# Patient Record
Sex: Male | Born: 1945
Health system: Southern US, Community
[De-identification: ages and names within clinical notes are randomized; demographics above are authoritative.]

## PROBLEM LIST (undated history)

## (undated) DIAGNOSIS — M4802 Spinal stenosis, cervical region: Secondary | ICD-10-CM

## (undated) DIAGNOSIS — H269 Unspecified cataract: Secondary | ICD-10-CM

## (undated) DIAGNOSIS — K635 Polyp of colon: Secondary | ICD-10-CM

## (undated) DIAGNOSIS — I10 Essential (primary) hypertension: Secondary | ICD-10-CM

## (undated) DIAGNOSIS — M1990 Unspecified osteoarthritis, unspecified site: Secondary | ICD-10-CM

## (undated) DIAGNOSIS — K5730 Diverticulosis of large intestine without perforation or abscess without bleeding: Secondary | ICD-10-CM

## (undated) DIAGNOSIS — R718 Other abnormality of red blood cells: Secondary | ICD-10-CM

## (undated) DIAGNOSIS — K219 Gastro-esophageal reflux disease without esophagitis: Secondary | ICD-10-CM

## (undated) DIAGNOSIS — R972 Elevated prostate specific antigen [PSA]: Secondary | ICD-10-CM

## (undated) DIAGNOSIS — N400 Enlarged prostate without lower urinary tract symptoms: Secondary | ICD-10-CM

## (undated) DIAGNOSIS — N529 Male erectile dysfunction, unspecified: Secondary | ICD-10-CM

## (undated) DIAGNOSIS — C801 Malignant (primary) neoplasm, unspecified: Secondary | ICD-10-CM

## (undated) HISTORY — PX: HEMORRHOID SURGERY: SHX153

## (undated) HISTORY — DX: Other abnormality of red blood cells: R71.8

## (undated) HISTORY — DX: Male erectile dysfunction, unspecified: N52.9

## (undated) HISTORY — PX: EYE SURGERY: SHX253

## (undated) HISTORY — DX: Benign prostatic hyperplasia without lower urinary tract symptoms: N40.0

## (undated) HISTORY — DX: Spinal stenosis, cervical region: M48.02

## (undated) HISTORY — DX: Unspecified cataract: H26.9

## (undated) HISTORY — PX: WISDOM TOOTH EXTRACTION: SHX21

## (undated) HISTORY — DX: Polyp of colon: K63.5

## (undated) HISTORY — PX: OTHER SURGICAL HISTORY: SHX169

## (undated) HISTORY — DX: Essential (primary) hypertension: I10

## (undated) HISTORY — PX: TONSILLECTOMY: SUR1361

## (undated) HISTORY — DX: Unspecified osteoarthritis, unspecified site: M19.90

## (undated) HISTORY — PX: VASECTOMY: SHX75

## (undated) HISTORY — DX: Diverticulosis of large intestine without perforation or abscess without bleeding: K57.30

## (undated) HISTORY — DX: Elevated prostate specific antigen (PSA): R97.20

---

## 1998-01-19 ENCOUNTER — Ambulatory Visit (HOSPITAL_BASED_OUTPATIENT_CLINIC_OR_DEPARTMENT_OTHER): Admission: RE | Admit: 1998-01-19 | Discharge: 1998-01-19 | Payer: Self-pay | Admitting: Plastic Surgery

## 2000-07-25 ENCOUNTER — Other Ambulatory Visit: Admission: RE | Admit: 2000-07-25 | Discharge: 2000-07-25 | Payer: Self-pay | Admitting: Urology

## 2000-07-25 ENCOUNTER — Encounter (INDEPENDENT_AMBULATORY_CARE_PROVIDER_SITE_OTHER): Payer: Self-pay

## 2004-04-28 ENCOUNTER — Ambulatory Visit: Payer: Self-pay | Admitting: Internal Medicine

## 2004-05-09 ENCOUNTER — Ambulatory Visit: Payer: Self-pay | Admitting: Internal Medicine

## 2005-05-04 ENCOUNTER — Ambulatory Visit: Payer: Self-pay | Admitting: Internal Medicine

## 2005-05-15 ENCOUNTER — Ambulatory Visit: Payer: Self-pay | Admitting: Internal Medicine

## 2005-06-14 ENCOUNTER — Ambulatory Visit: Payer: Self-pay | Admitting: Internal Medicine

## 2006-04-12 ENCOUNTER — Ambulatory Visit: Payer: Self-pay | Admitting: Internal Medicine

## 2006-04-12 LAB — CONVERTED CEMR LAB
Basophils Relative: 0.5 % (ref 0.0–1.0)
CO2: 31 meq/L (ref 19–32)
Creatinine, Ser: 1.1 mg/dL (ref 0.4–1.5)
GFR calc Af Amer: 87 mL/min
HDL: 37.4 mg/dL — ABNORMAL LOW (ref >39.0)
Ketones, ur: NEGATIVE mg/dL
LDL Cholesterol: 124 mg/dL — ABNORMAL HIGH (ref 0–99)
Leukocytes, UA: NEGATIVE
MCHC: 31.2 g/dL (ref 30.0–36.0)
MCV: 60.6 fL — ABNORMAL LOW (ref 78.0–100.0)
Monocytes Absolute: 0.4 10*3/uL (ref 0.2–0.7)
Monocytes Relative: 7.8 % (ref 3.0–11.0)
PSA: 5.17 ng/mL — ABNORMAL HIGH (ref 0.10–4.00)
Potassium: 4.3 meq/L (ref 3.5–5.1)
RBC: 6.83 M/uL — ABNORMAL HIGH (ref 4.22–5.81)
RDW: 14.7 % — ABNORMAL HIGH (ref 11.5–14.6)
TSH: 1.1 microintl units/mL (ref 0.35–5.50)
Total CHOL/HDL Ratio: 4.9
Triglycerides: 113 mg/dL (ref 0–149)
Urine Glucose: NEGATIVE mg/dL
VLDL: 23 mg/dL (ref 0–40)
WBC: 4.9 10*3/uL (ref 4.5–10.5)

## 2006-04-18 ENCOUNTER — Ambulatory Visit: Payer: Self-pay | Admitting: Internal Medicine

## 2007-02-07 HISTORY — PX: TOENAIL EXCISION: SUR558

## 2007-02-11 ENCOUNTER — Encounter: Payer: Self-pay | Admitting: Internal Medicine

## 2007-04-16 ENCOUNTER — Ambulatory Visit: Payer: Self-pay | Admitting: Internal Medicine

## 2007-04-16 LAB — CONVERTED CEMR LAB
ALT: 26 units/L (ref 0–53)
Albumin: 4 g/dL (ref 3.5–5.2)
Alkaline Phosphatase: 55 units/L (ref 39–117)
BUN: 13 mg/dL (ref 6–23)
Basophils Absolute: 0.1 10*3/uL (ref 0.0–0.1)
CO2: 32 meq/L (ref 19–32)
Calcium: 9.4 mg/dL (ref 8.4–10.5)
Chloride: 105 meq/L (ref 96–112)
Cholesterol: 194 mg/dL (ref 0–200)
HDL: 38.1 mg/dL — ABNORMAL LOW (ref >39.0)
Hemoglobin, Urine: NEGATIVE
Hemoglobin: 13 g/dL (ref 13.0–17.0)
Ketones, ur: NEGATIVE mg/dL
LDL Cholesterol: 134 mg/dL — ABNORMAL HIGH (ref 0–99)
Lymphocytes Relative: 23.3 % (ref 12.0–46.0)
Monocytes Absolute: 0.6 10*3/uL (ref 0.2–0.7)
Neutro Abs: 3.2 10*3/uL (ref 1.4–7.7)
PSA: 5.48 ng/mL — ABNORMAL HIGH (ref 0.10–4.00)
Potassium: 4.4 meq/L (ref 3.5–5.1)
Total Bilirubin: 1.2 mg/dL (ref 0.3–1.2)
Total CHOL/HDL Ratio: 5.1
Total Protein: 6.8 g/dL (ref 6.0–8.3)
Triglycerides: 111 mg/dL (ref 0–149)
VLDL: 22 mg/dL (ref 0–40)
pH: 6 (ref 5.0–8.0)

## 2007-04-19 ENCOUNTER — Ambulatory Visit: Payer: Self-pay | Admitting: Internal Medicine

## 2007-04-19 DIAGNOSIS — Z860100 Personal history of colon polyps, unspecified: Secondary | ICD-10-CM | POA: Insufficient documentation

## 2007-04-19 DIAGNOSIS — N400 Enlarged prostate without lower urinary tract symptoms: Secondary | ICD-10-CM | POA: Insufficient documentation

## 2007-04-19 DIAGNOSIS — I10 Essential (primary) hypertension: Secondary | ICD-10-CM | POA: Insufficient documentation

## 2007-04-19 DIAGNOSIS — K5730 Diverticulosis of large intestine without perforation or abscess without bleeding: Secondary | ICD-10-CM | POA: Insufficient documentation

## 2007-04-20 DIAGNOSIS — J309 Allergic rhinitis, unspecified: Secondary | ICD-10-CM | POA: Insufficient documentation

## 2007-07-18 ENCOUNTER — Ambulatory Visit: Payer: Self-pay | Admitting: Gastroenterology

## 2007-07-31 ENCOUNTER — Ambulatory Visit: Payer: Self-pay | Admitting: Gastroenterology

## 2007-07-31 LAB — HM COLONOSCOPY

## 2007-09-06 ENCOUNTER — Ambulatory Visit: Payer: Self-pay | Admitting: Internal Medicine

## 2007-12-02 ENCOUNTER — Telehealth: Payer: Self-pay | Admitting: Internal Medicine

## 2007-12-02 DIAGNOSIS — R209 Unspecified disturbances of skin sensation: Secondary | ICD-10-CM

## 2008-01-13 ENCOUNTER — Telehealth (INDEPENDENT_AMBULATORY_CARE_PROVIDER_SITE_OTHER): Payer: Self-pay | Admitting: *Deleted

## 2008-01-14 ENCOUNTER — Ambulatory Visit: Payer: Self-pay | Admitting: Internal Medicine

## 2008-01-14 DIAGNOSIS — R059 Cough, unspecified: Secondary | ICD-10-CM | POA: Insufficient documentation

## 2008-01-14 DIAGNOSIS — R05 Cough: Secondary | ICD-10-CM

## 2008-01-14 DIAGNOSIS — B351 Tinea unguium: Secondary | ICD-10-CM

## 2008-01-14 DIAGNOSIS — J209 Acute bronchitis, unspecified: Secondary | ICD-10-CM | POA: Insufficient documentation

## 2008-01-16 ENCOUNTER — Encounter: Payer: Self-pay | Admitting: Internal Medicine

## 2008-01-20 ENCOUNTER — Encounter: Payer: Self-pay | Admitting: Internal Medicine

## 2008-01-27 ENCOUNTER — Encounter: Payer: Self-pay | Admitting: Internal Medicine

## 2008-04-02 ENCOUNTER — Encounter: Payer: Self-pay | Admitting: Internal Medicine

## 2008-04-14 ENCOUNTER — Ambulatory Visit: Payer: Self-pay | Admitting: Internal Medicine

## 2008-04-15 ENCOUNTER — Encounter: Admission: RE | Admit: 2008-04-15 | Discharge: 2008-04-28 | Payer: Self-pay | Admitting: Neurology

## 2008-04-16 LAB — CONVERTED CEMR LAB
ALT: 31 units/L (ref 0–53)
AST: 27 units/L (ref 0–37)
Albumin: 3.8 g/dL (ref 3.5–5.2)
Alkaline Phosphatase: 52 units/L (ref 39–117)
Basophils Absolute: 0.1 10*3/uL (ref 0.0–0.1)
Basophils Relative: 1.4 % (ref 0.0–3.0)
Bilirubin, Direct: 0.2 mg/dL (ref 0.0–0.3)
Eosinophils Absolute: 0.2 10*3/uL (ref 0.0–0.7)
Glucose, Bld: 102 mg/dL — ABNORMAL HIGH (ref 70–99)
HCT: 40.8 % (ref 39.0–52.0)
Hemoglobin, Urine: NEGATIVE
Leukocytes, UA: NEGATIVE
Monocytes Absolute: 0.6 10*3/uL (ref 0.1–1.0)
Neutro Abs: 3 10*3/uL (ref 1.4–7.7)
Neutrophils Relative %: 62 % (ref 43.0–77.0)
PSA: 5.88 ng/mL — ABNORMAL HIGH (ref 0.10–4.00)
Potassium: 4.3 meq/L (ref 3.5–5.1)
RBC: 6.69 M/uL — ABNORMAL HIGH (ref 4.22–5.81)
RDW: 14.9 % — ABNORMAL HIGH (ref 11.5–14.6)
Sodium: 141 meq/L (ref 135–145)
Total CHOL/HDL Ratio: 4.7
Total Protein, Urine: NEGATIVE mg/dL
VLDL: 22 mg/dL (ref 0–40)
WBC: 4.8 10*3/uL (ref 4.5–10.5)

## 2008-04-21 ENCOUNTER — Ambulatory Visit: Payer: Self-pay | Admitting: Internal Medicine

## 2008-04-21 DIAGNOSIS — J019 Acute sinusitis, unspecified: Secondary | ICD-10-CM | POA: Insufficient documentation

## 2008-12-15 ENCOUNTER — Encounter: Payer: Self-pay | Admitting: Internal Medicine

## 2009-01-06 ENCOUNTER — Telehealth: Payer: Self-pay | Admitting: Internal Medicine

## 2009-04-19 ENCOUNTER — Ambulatory Visit: Payer: Self-pay | Admitting: Internal Medicine

## 2009-04-21 LAB — CONVERTED CEMR LAB
ALT: 21 units/L (ref 0–53)
AST: 20 units/L (ref 0–37)
Albumin: 4 g/dL (ref 3.5–5.2)
BUN: 12 mg/dL (ref 6–23)
Bilirubin Urine: NEGATIVE
Bilirubin, Direct: 0.2 mg/dL (ref 0.0–0.3)
CO2: 32 meq/L (ref 19–32)
Creatinine, Ser: 1.1 mg/dL (ref 0.4–1.5)
Eosinophils Relative: 3.1 % (ref 0.0–5.0)
Hemoglobin, Urine: NEGATIVE
LDL Cholesterol: 121 mg/dL — ABNORMAL HIGH (ref 0–99)
Leukocytes, UA: NEGATIVE
Monocytes Absolute: 0.5 10*3/uL (ref 0.1–1.0)
Neutro Abs: 3.1 10*3/uL (ref 1.4–7.7)
Neutrophils Relative %: 59.6 % (ref 43.0–77.0)
Platelets: 166 10*3/uL (ref 150.0–400.0)
RBC: 6.69 M/uL — ABNORMAL HIGH (ref 4.22–5.81)
RDW: 15.2 % — ABNORMAL HIGH (ref 11.5–14.6)
Sodium: 144 meq/L (ref 135–145)
TSH: 1.33 microintl units/mL (ref 0.35–5.50)
Total Bilirubin: 1.3 mg/dL — ABNORMAL HIGH (ref 0.3–1.2)
Total CHOL/HDL Ratio: 4
Total Protein, Urine: NEGATIVE mg/dL
Urobilinogen, UA: 0.2 (ref 0.0–1.0)
WBC: 5.1 10*3/uL (ref 4.5–10.5)
pH: 5.5 (ref 5.0–8.0)

## 2009-04-23 ENCOUNTER — Encounter: Payer: Self-pay | Admitting: Internal Medicine

## 2009-04-23 ENCOUNTER — Ambulatory Visit: Payer: Self-pay | Admitting: Internal Medicine

## 2009-04-23 DIAGNOSIS — M79602 Pain in left arm: Secondary | ICD-10-CM | POA: Insufficient documentation

## 2009-04-23 DIAGNOSIS — M79609 Pain in unspecified limb: Secondary | ICD-10-CM

## 2009-12-07 ENCOUNTER — Encounter: Payer: Self-pay | Admitting: Internal Medicine

## 2010-01-06 ENCOUNTER — Telehealth: Payer: Self-pay | Admitting: Internal Medicine

## 2010-03-10 NOTE — Assessment & Plan Note (Signed)
Summary: PHYSICAL--STC   Vital Signs:  Patient profile:   65 year old male Height:      68 inches Weight:      201 pounds BMI:     30.67 Temp:     97.7 degrees F oral Pulse rate:   84 / minute BP sitting:   122 / 82  (left arm)  Vitals Entered By: Tora Perches (April 23, 2009 10:21 AM) CC: cpx Is Patient Diabetic? No   CC:  cpx.  History of Present Illness: The patient presents for a wellness examination  C/o LBP  down R leg x 2 wks  Preventive Screening-Counseling & Management  Alcohol-Tobacco     Smoking Status: never  Current Medications (verified): 1)  Zolpidem Tartrate 10 Mg  Tabs (Zolpidem Tartrate) .... At Bedtime As Needed 2)  Diovan Hct 320-25 Mg Tabs (Valsartan-Hydrochlorothiazide) .... Take 1 Tab Daily 3)  Vitamin D3 1000 Unit  Tabs (Cholecalciferol) .Marland Kitchen.. 1 By Mouth Daily 4)  Fish Oil   Oil (Fish Oil) .Marland Kitchen.. 1 By Mouth Bid 5)  Flonase 50 Mcg/act  Susp (Fluticasone Propionate) .... 2 Spr in Each Nostril Qd 6)  Miralax   Powd (Polyethylene Glycol 3350) .Marland Kitchen.. 17g By Mouth Once Daily As Needed Constipation 7)  Prednisone 10 Mg Tabs (Prednisone) .... As Needed 8)  Cyclobenzaprine Hcl 10 Mg Tabs (Cyclobenzaprine Hcl) .... As Needed  Allergies: 1)  Levaquin  Past History:  Past Medical History: Last updated: 04/21/2008 Diverticulosis, colon Hypertension Benign prostatic hypertrophy/Elev.PSA ED Colonic polyps, hx of Hemorroids Microcytosis Allergic rhinitis Cervical spinal stenosis Dr Marylou Flesher  Past Surgical History: Last updated: 04/21/2008 All toenails removed 2009  Family History: Last updated: 04/19/2007 Family History Hypertension  Social History: Last updated: 04/21/2008 Occupation: Oceanographer; racing cars Married Never Smoked Regular exercise-yes  Review of Systems  The patient denies anorexia, fever, weight loss, weight gain, vision loss, decreased hearing, hoarseness, chest pain, syncope, dyspnea on exertion, peripheral edema, prolonged  cough, headaches, hemoptysis, abdominal pain, melena, hematochezia, severe indigestion/heartburn, hematuria, incontinence, genital sores, muscle weakness, suspicious skin lesions, transient blindness, difficulty walking, depression, unusual weight change, abnormal bleeding, enlarged lymph nodes, angioedema, and testicular masses.         LBP  Physical Exam  General:  NAD Head:  Normocephalic and atraumatic without obvious abnormalities. No apparent alopecia or balding. Eyes:  No corneal or conjunctival inflammation noted. EOMI. Perrla.  Ears:  External ear exam shows no significant lesions or deformities.  Otoscopic examination reveals some wax B, tympanic membranes are intact bilaterally without bulging, retraction, inflammation or discharge. Hearing is grossly normal bilaterally. Nose:  no external deformity and no nasal discharge.   Mouth:  Oral mucosa and oropharynx without lesions or exudates.  Teeth in good repair. Neck:  No deformities, masses, or tenderness noted. Chest Wall:  No deformities, masses, tenderness or gynecomastia noted. Lungs:  Normal respiratory effort, chest expands symmetrically. Lungs are clear to auscultation, no crackles or wheezes. Heart:  Normal rate and regular rhythm. S1 and S2 normal without gallop, murmur, click, rub or other extra sounds. Abdomen:  Bowel sounds positive,abdomen soft and non-tender without masses, organomegaly or hernias noted. Rectal:  normal sphincter tone, no masses, no tenderness, no fissures, no perianal rash, and external hemorrhoid(s).   Genitalia:  Testes bilaterally descended without nodularity, tenderness or masses. No scrotal masses or lesions. No penis lesions or urethral discharge. Prostate:  1+ enlarged.   Msk:  Lumbar-sacral spine is tender to palpation over paraspinal muscles and painfull with  the ROM  Stiff LS spine Strait leg elev (-) B Pulses:  R and L carotid,radial,femoral,dorsalis pedis and posterior tibial pulses are  full and equal bilaterally Extremities:  No clubbing, cyanosis, edema, or deformity noted with normal full range of motion of all joints.   Neurologic:  No cranial nerve deficits noted. Station and gait are normal. Plantar reflexes are down-going bilaterally. DTRs are symmetrical throughout. Sensory, motor and coordinative functions appear intact. Skin:  Intact without suspicious lesions or rashes Cervical Nodes:  No lymphadenopathy noted Inguinal Nodes:  No significant adenopathy Psych:  Cognition and judgment appear intact. Alert and cooperative with normal attention span and concentration. No apparent delusions, illusions, hallucinations   Impression & Recommendations:  Problem # 1:  WELL ADULT EXAM (ICD-V70.0) Assessment New Health and age related issues were discussed. Available screening tests and vaccinations were discussed as well. Healthy life style including good diet and execise was discussed. Form for racing was filled out. The labs were reviewed with the patient.  Orders: EKG w/ Interpretation (93000)  Problem # 2:  LOW BACK PAIN, ACUTE (ICD-724.2) Assessment: New Take 40mg  qd for 3 days, then 20 mg qd for 3 days, then 10mg  qd for 6 days, then stop. Take pc.  See "Patient Instructions". RTC in 1 months if not well. His updated medication list for this problem includes:    Cyclobenzaprine Hcl 10 Mg Tabs (Cyclobenzaprine hcl) .Marland Kitchen... 1 po bid as needed    Aleve 220 Mg Caps (Naproxen sodium) .Marland Kitchen... 1 by mouth once daily pc prn  Orders: T-Lumbar Spine 2 Views (72100TC)  Problem # 3:  LEG PAIN (ICD-729.5) R Assessment: New  Orders: T-Lumbar Spine 2 Views (72100TC)  Complete Medication List: 1)  Zolpidem Tartrate 10 Mg Tabs (Zolpidem tartrate) .... At bedtime as needed 2)  Diovan Hct 320-25 Mg Tabs (Valsartan-hydrochlorothiazide) .... Take 1 tab daily 3)  Vitamin D3 1000 Unit Tabs (Cholecalciferol) .Marland Kitchen.. 1 by mouth daily 4)  Fish Oil Oil (Fish oil) .Marland Kitchen.. 1 by mouth bid 5)   Flonase 50 Mcg/act Susp (Fluticasone propionate) .... 2 spr in each nostril qd 6)  Miralax Powd (Polyethylene glycol 3350) .Marland Kitchen.. 17g by mouth once daily as needed constipation 7)  Prednisone 10 Mg Tabs (Prednisone) .... As needed 8)  Cyclobenzaprine Hcl 10 Mg Tabs (Cyclobenzaprine hcl) .Marland Kitchen.. 1 po bid as needed 9)  Aleve 220 Mg Caps (Naproxen sodium) .Marland Kitchen.. 1 by mouth once daily pc prn 10)  Prednisone 10 Mg Tabs (Prednisone) .... Take 40mg  qd for 3 days, then 20 mg qd for 3 days, then 10mg  qd for 6 days, then stop. take pc.  Other Orders: Pneumococcal Vaccine (04540) Admin 1st Vaccine (98119) Admin 1st Vaccine [facility]) [phone])  Patient Instructions: 1)  Try to eat more raw plant food, fresh and dry fruit, raw almonds, leafy vegetables, whole foods and less red meat, less animal fat. Poultry and fish is better for you than pork and beef. Avoid processed foods (canned soups, hot dogs, sausage, bacon , frozen dinners). Avoid corn syrup, high fructose syrup or aspartam  containing drinks.. Make your own  dressing with olive oil, wine vinegar, lemon juce, garlic etc. for your salads. 2)  Please schedule a follow-up appointment in 1 year. 3)  Call if you are not better in a reasonable amount of time or if worse.  4)  Start taking a yoga class  5)  Use stretching exercises that I have provided (15 min. or longer every day) Prescriptions: CYCLOBENZAPRINE HCL 10  MG TABS (CYCLOBENZAPRINE HCL) 1 po bid as needed  #60 x 2   Entered and Authorized by:   Tresa Garter MD   Signed by:   Tresa Garter MD on 04/23/2009   Method used:   Print then Give to Patient   RxID:   [id number] PREDNISONE 10 MG TABS (PREDNISONE) Take 40mg  qd for 3 days, then 20 mg qd for 3 days, then 10mg  qd for 6 days, then stop. Take pc.  #24 x 1   Entered and Authorized by:   Tresa Garter MD   Signed by:   Tresa Garter MD on 04/23/2009   Method used:   Print then Give to Patient   RxID:    [id number] ALEVE 220 MG CAPS (NAPROXEN SODIUM) 1 by mouth once daily pc prn  #100 x 3   Entered and Authorized by:   Tresa Garter MD   Signed by:   Tresa Garter MD on 04/23/2009   Method used:   Print then Give to Patient   RxID:   [id number] FLONASE 50 MCG/ACT  SUSP (FLUTICASONE PROPIONATE) 2 spr in each nostril qd  #1 x 12   Entered and Authorized by:   Tresa Garter MD   Signed by:   Tresa Garter MD on 04/23/2009   Method used:   Print then Give to Patient   RxID:   [id number] FISH OIL   OIL (FISH OIL) 1 by mouth bid  #200 x 3   Entered and Authorized by:   Tresa Garter MD   Signed by:   Tresa Garter MD on 04/23/2009   Method used:   Print then Give to Patient   RxID:   [id number] VITAMIN D3 1000 UNIT  TABS (CHOLECALCIFEROL) 1 by mouth daily  #100 x 3   Entered and Authorized by:   Tresa Garter MD   Signed by:   Tresa Garter MD on 04/23/2009   Method used:   Print then Give to Patient   RxID:   [id number] DIOVAN HCT 320-25 MG TABS (VALSARTAN-HYDROCHLOROTHIAZIDE) Take 1 tab daily  #30 x 11   Entered and Authorized by:   Tresa Garter MD   Signed by:   Tresa Garter MD on 04/23/2009   Method used:   Print then Give to Patient   RxID:   [id number] ZOLPIDEM TARTRATE 10 MG  TABS (ZOLPIDEM TARTRATE) at bedtime as needed  #30 x 5   Entered and Authorized by:   Tresa Garter MD   Signed by:   Tresa Garter MD on 04/23/2009   Method used:   Print then Give to Patient   RxID:   [id number]    Pneumovax Vaccine    Vaccine Type: Pneumovax    Site: right deltoid    Mfr: Merck    Dose: 0.5 ml    Route: IM    Given by: Tora Perches    Exp. Date: 09/20/2010    Lot #: [id number]    VIS given: 09/04/95 version given April 23, 2009.

## 2010-03-10 NOTE — Progress Notes (Signed)
Summary: Rf Zolpidem  Phone Note Refill Request Message from:  Fax from Pharmacy  Refills Requested: Medication #1:  ZOLPIDEM TARTRATE 10 MG  TABS at bedtime as needed   Dosage confirmed as above?Dosage Confirmed   Supply Requested: 30   Last Refilled: 12/07/2009  Method Requested: Telephone to Pharmacy Next Appointment Scheduled: none Initial call taken by: Lanier Prude, [facility]),  January 06, 2010 11:51 AM  Follow-up for Phone Call        ok 6 ref Follow-up by: Tresa Garter MD,  January 06, 2010 1:01 PM  Additional Follow-up for Phone Call Additional follow up Details #1::        Rx called to pharmacy Additional Follow-up by: Lanier Prude, [facility]),  January 06, 2010 2:25 PM    Prescriptions: ZOLPIDEM TARTRATE 10 MG  TABS (ZOLPIDEM TARTRATE) at bedtime as needed  #30 x 5   Entered by:   Lanier Prude, [facility])   Authorized by:   Tresa Garter MD   Signed by:   Lanier Prude, CMA(AAMA) on 01/06/2010   Method used:   Telephoned to ...       [facility]  [address] [phone]* (retail)       [address]       [place], [place]  [address]       Ph: [phone] or [phone]       Fax: [phone]   RxID:   [id number]

## 2010-03-10 NOTE — Letter (Signed)
Summary: Physical Exam/[facility]  Physical Exam/[facility]   Imported By: Sherian Rein 04/27/2009 13:49:49  _____________________________________________________________________  External Attachment:    Type:   Image     Comment:   External Document

## 2010-03-10 NOTE — Miscellaneous (Signed)
Summary: [facility]   Imported By: Lester [place] 04/30/2009 10:26:08  _____________________________________________________________________  External Attachment:    Type:   Image     Comment:   External Document

## 2010-03-10 NOTE — Letter (Signed)
Summary: [facility]  [facility]   Imported By: Lennie Odor 12/22/2009 15:00:14  _____________________________________________________________________  External Attachment:    Type:   Image     Comment:   External Document

## 2010-05-05 ENCOUNTER — Other Ambulatory Visit: Payer: Self-pay | Admitting: Internal Medicine

## 2010-05-05 ENCOUNTER — Other Ambulatory Visit (INDEPENDENT_AMBULATORY_CARE_PROVIDER_SITE_OTHER): Payer: PRIVATE HEALTH INSURANCE

## 2010-05-05 DIAGNOSIS — Z0389 Encounter for observation for other suspected diseases and conditions ruled out: Secondary | ICD-10-CM

## 2010-05-05 DIAGNOSIS — Z0000 Encounter for general adult medical examination without abnormal findings: Secondary | ICD-10-CM

## 2010-05-05 LAB — BASIC METABOLIC PANEL
GFR: 64.88 mL/min (ref >60.00)
Potassium: 4.6 mEq/L (ref 3.5–5.1)
Sodium: 142 mEq/L (ref 135–145)

## 2010-05-05 LAB — URINALYSIS
Bilirubin Urine: NEGATIVE
Specific Gravity, Urine: 1.02 (ref 1.000–1.030)
Urine Glucose: NEGATIVE
pH: 6 (ref 5.0–8.0)

## 2010-05-05 LAB — CBC WITH DIFFERENTIAL/PLATELET
Basophils Absolute: 0 10*3/uL (ref 0.0–0.1)
Basophils Relative: 0.4 % (ref 0.0–3.0)
Eosinophils Absolute: 0.1 10*3/uL (ref 0.0–0.7)
MCHC: 32 g/dL (ref 30.0–36.0)
MCV: 60.2 fl — ABNORMAL LOW (ref 78.0–100.0)
Monocytes Absolute: 0.4 10*3/uL (ref 0.1–1.0)
Neutrophils Relative %: 67.4 % (ref 43.0–77.0)
Platelets: 175 10*3/uL (ref 150.0–400.0)
RDW: 16.2 % — ABNORMAL HIGH (ref 11.5–14.6)

## 2010-05-05 LAB — LIPID PANEL
Cholesterol: 168 mg/dL (ref 0–200)
HDL: 41.3 mg/dL (ref >39.00)
Triglycerides: 84 mg/dL (ref 0.0–149.0)
VLDL: 16.8 mg/dL (ref 0.0–40.0)

## 2010-05-05 LAB — HEPATIC FUNCTION PANEL
ALT: 25 U/L (ref 0–53)
AST: 28 U/L (ref 0–37)
Albumin: 4.1 g/dL (ref 3.5–5.2)
Alkaline Phosphatase: 48 U/L (ref 39–117)
Bilirubin, Direct: 0.2 mg/dL (ref 0.0–0.3)
Total Protein: 6.5 g/dL (ref 6.0–8.3)

## 2010-05-12 ENCOUNTER — Ambulatory Visit (INDEPENDENT_AMBULATORY_CARE_PROVIDER_SITE_OTHER)
Admission: RE | Admit: 2010-05-12 | Discharge: 2010-05-12 | Disposition: A | Discharge status: Home / Self Care | Payer: PRIVATE HEALTH INSURANCE | Source: Ambulatory Visit | Attending: Internal Medicine | Admitting: Internal Medicine

## 2010-05-12 ENCOUNTER — Ambulatory Visit (INDEPENDENT_AMBULATORY_CARE_PROVIDER_SITE_OTHER): Payer: PRIVATE HEALTH INSURANCE | Admitting: Internal Medicine

## 2010-05-12 ENCOUNTER — Encounter: Payer: Self-pay | Admitting: Internal Medicine

## 2010-05-12 ENCOUNTER — Telehealth: Payer: Self-pay | Admitting: *Deleted

## 2010-05-12 VITALS — BP 110/70 | HR 72 | Temp 98.2°F | Resp 16 | Ht 68.0 in | Wt 186.0 lb

## 2010-05-12 DIAGNOSIS — Z0000 Encounter for general adult medical examination without abnormal findings: Secondary | ICD-10-CM | POA: Insufficient documentation

## 2010-05-12 DIAGNOSIS — R071 Chest pain on breathing: Secondary | ICD-10-CM

## 2010-05-12 DIAGNOSIS — Z23 Encounter for immunization: Secondary | ICD-10-CM

## 2010-05-12 DIAGNOSIS — R0789 Other chest pain: Secondary | ICD-10-CM | POA: Insufficient documentation

## 2010-05-12 MED ORDER — FLUTICASONE PROPIONATE 50 MCG/ACT NA SUSP: 2.0000 | Freq: Every day | NASAL | Status: DC

## 2010-05-12 MED ORDER — PREDNISONE 10 MG PO TABS: 10.0000 mg | ORAL_TABLET | Freq: Two times a day (BID) | ORAL | Status: DC

## 2010-05-12 MED ORDER — NAPROXEN SODIUM 220 MG PO TABS: 220.0000 mg | ORAL_TABLET | Freq: Every day | ORAL | Status: DC | PRN

## 2010-05-12 MED ORDER — VALSARTAN-HYDROCHLOROTHIAZIDE 320-25 MG PO TABS: 1.0000 | ORAL_TABLET | Freq: Every day | ORAL | Status: DC

## 2010-05-12 NOTE — Patient Instructions (Addendum)
Stay well! You can try to take Diovan HCT 1/2 tab. A day

## 2010-05-12 NOTE — Assessment & Plan Note (Addendum)
EKG is nl Doing well Colon due 2019 Tetanus inj - done Form for car racing was filled out

## 2010-05-12 NOTE — Progress Notes (Signed)
Subjective:    Patient ID: Chad Palmer, male    DOB: 02/05/1946, 65 y.o.   MRN: [id number]  HPI The patient is here for a wellness exam. The patient has been doing well overall without major physical or psychological issues going on lately. The patient needs to address his chronic hypertension that has been well controlled with medicines. He had some R CP in the back x few days ?? After yard work  Review of Systems  Constitutional: Negative for appetite change, fatigue and unexpected weight change.  HENT: Negative for nosebleeds, congestion, sore throat, sneezing, trouble swallowing and neck pain.   Eyes: Negative for itching and visual disturbance.  Respiratory: Negative for cough.   Cardiovascular: Negative for chest pain, palpitations and leg swelling.  Gastrointestinal: Negative for nausea, diarrhea, blood in stool and abdominal distention.  Genitourinary: Negative for frequency and hematuria.  Musculoskeletal: Negative for back pain, joint swelling and gait problem.  Skin: Negative for rash.  Neurological: Negative for dizziness, tremors, speech difficulty and weakness.  Psychiatric/Behavioral: Negative for sleep disturbance, dysphoric mood and agitation. The patient is not nervous/anxious.    Wt Readings from Last 3 Encounters:  05/12/10 186 lb (84.369 kg)  04/23/09 201 lb (91.173 kg)  04/21/08 205 lb (92.987 kg)       Objective:   Physical Exam  Constitutional: He is oriented to person, place, and time. He appears well-developed and well-nourished. No distress.  HENT:  Head: Normocephalic and atraumatic.  Right Ear: External ear normal.  Left Ear: External ear normal.  Nose: Nose normal.  Mouth/Throat: Oropharynx is clear and moist. No oropharyngeal exudate.  Eyes: Conjunctivae and EOM are normal. Pupils are equal, round, and reactive to light. Right eye exhibits no discharge. Left eye exhibits no discharge. No scleral icterus.  Neck: Normal range of motion. Neck  supple. No JVD present. No tracheal deviation present. No thyromegaly present.  Cardiovascular: Normal rate, regular rhythm, normal heart sounds and intact distal pulses.  Exam reveals no gallop and no friction rub.   No murmur heard. Pulmonary/Chest: Effort normal and breath sounds normal. No stridor. No respiratory distress. He has no wheezes. He has no rales. He exhibits no tenderness.  Abdominal: Soft. Bowel sounds are normal. He exhibits no distension and no mass. There is no tenderness. There is no rebound and no guarding.  Genitourinary: Rectum normal, prostate normal and penis normal. Guaiac negative stool. No penile tenderness.  Musculoskeletal: Normal range of motion. He exhibits no edema and no tenderness.  Lymphadenopathy:    He has no cervical adenopathy.  Neurological: He is alert and oriented to person, place, and time. He has normal reflexes. No cranial nerve deficit. He exhibits normal muscle tone. Coordination normal.  Skin: Skin is warm and dry. No rash noted. He is not diaphoretic. No erythema. No pallor.  Psychiatric: He has a normal mood and affect. His behavior is normal. Judgment and thought content normal.       Lab Results  Component Value Date   WBC 4.3* 05/05/2010   HGB 12.5* 05/05/2010   HCT 39.0 05/05/2010   PLT 175.0 05/05/2010   CHOL 168 05/05/2010   TRIG 84.0 05/05/2010   HDL 41.30 05/05/2010   ALT 25 05/05/2010   AST 28 05/05/2010   NA 142 05/05/2010   K 4.6 05/05/2010   CL 106 05/05/2010   CREATININE 1.2 05/05/2010   BUN 16 05/05/2010   CO2 30 05/05/2010   TSH 0.77 05/05/2010   PSA 6.35* 05/05/2010  Assessment & Plan:

## 2010-05-12 NOTE — Telephone Encounter (Signed)
Please see 05-12-10 CXR.... Radiologist recommends Chest CT with contrast if no contrindications due to nodule at Rt lung base.  Pt was seen today for CPE

## 2010-05-12 NOTE — Telephone Encounter (Signed)
Will schedule, Thank you!

## 2010-05-12 NOTE — Assessment & Plan Note (Signed)
Likely MSK - get a CXR

## 2010-05-12 NOTE — Telephone Encounter (Signed)
This can wait until reviewed by Dr. AVP next week.

## 2010-05-16 ENCOUNTER — Other Ambulatory Visit: Payer: Self-pay | Admitting: *Deleted

## 2010-05-16 ENCOUNTER — Telehealth: Payer: Self-pay | Admitting: *Deleted

## 2010-05-16 DIAGNOSIS — R9389 Abnormal findings on diagnostic imaging of other specified body structures: Secondary | ICD-10-CM

## 2010-05-16 MED ORDER — TETANUS-DIPHTHERIA TOXOIDS TD 5-2 LFU IM INJ
0.5000 mL | INJECTION | Freq: Once | INTRAMUSCULAR | Status: AC
  Administered 2010-05-16: 0.5 mL via INTRAMUSCULAR

## 2010-05-16 MED ORDER — TETANUS-DIPHTHERIA TOXOIDS TD 5-2 LFU IM INJ: 0.5000 mL | INJECTION | Freq: Once | INTRAMUSCULAR | Status: DC

## 2010-05-16 NOTE — Telephone Encounter (Signed)
Pt informed

## 2010-05-16 NOTE — Telephone Encounter (Signed)
rec fax from pharm stating naproxen 220mg  is not available....they have 250mg .  Ok to fill this instead?

## 2010-05-16 NOTE — Telephone Encounter (Signed)
Left mess for patient to call back to inform of below

## 2010-05-16 NOTE — Progress Notes (Signed)
Addended by: Burnard Leigh on: 05/16/2010 12:06 PM   Modules accepted: Orders

## 2010-05-16 NOTE — Progress Notes (Signed)
error 

## 2010-05-16 NOTE — Progress Notes (Signed)
Addended by: Burnard Leigh on: 05/16/2010 11:52 AM   Modules accepted: Orders

## 2010-05-17 ENCOUNTER — Other Ambulatory Visit: Payer: Self-pay | Admitting: Neurosurgery

## 2010-05-17 DIAGNOSIS — R9389 Abnormal findings on diagnostic imaging of other specified body structures: Secondary | ICD-10-CM | POA: Insufficient documentation

## 2010-05-17 NOTE — Telephone Encounter (Signed)
1. OK 250 mg Naproxen instead 2. Pls sch chest CT - ref is done

## 2010-05-18 MED ORDER — NAPROXEN SODIUM 220 MG PO TABS: 220.0000 mg | ORAL_TABLET | Freq: Every day | ORAL | Status: DC | PRN

## 2010-05-18 MED ORDER — NAPROXEN 250 MG PO TABS: 250.0000 mg | ORAL_TABLET | Freq: Every day | ORAL | Status: DC

## 2010-05-18 NOTE — Telephone Encounter (Signed)
New rx sent

## 2010-05-24 ENCOUNTER — Telehealth: Payer: Self-pay | Admitting: Internal Medicine

## 2010-05-24 ENCOUNTER — Ambulatory Visit (INDEPENDENT_AMBULATORY_CARE_PROVIDER_SITE_OTHER)
Admission: RE | Admit: 2010-05-24 | Discharge: 2010-05-24 | Disposition: A | Discharge status: Home / Self Care | Payer: PRIVATE HEALTH INSURANCE | Source: Ambulatory Visit | Attending: Internal Medicine | Admitting: Internal Medicine

## 2010-05-24 DIAGNOSIS — R918 Other nonspecific abnormal finding of lung field: Secondary | ICD-10-CM

## 2010-05-24 MED ORDER — IOHEXOL 300 MG/ML  SOLN
80.0000 mL | Freq: Once | INTRAMUSCULAR | Status: AC | PRN
  Administered 2010-05-24: 80 mL via INTRAVENOUS

## 2010-05-24 NOTE — Telephone Encounter (Signed)
Message copied by Sonda Primes on Tue May 24, 2010 10:15 PM ------      Message from: Lanier Prude      Created: Tue May 24, 2010  5:40 PM                   ----- Message -----         From: Rad Results In Interface         Sent: 05/24/2010   4:23 PM           To: Lanier Prude, CMA

## 2010-05-24 NOTE — Telephone Encounter (Signed)
Chad Palmer , please, inform the patient:  The chest CT is normal, good newsThank you !

## 2010-05-25 ENCOUNTER — Telehealth: Payer: Self-pay | Admitting: Internal Medicine

## 2010-05-25 NOTE — Telephone Encounter (Signed)
Pt requests CT results done at [facility], very anxious.

## 2010-05-26 NOTE — Telephone Encounter (Signed)
Pt informed
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
## 2010-07-05 ENCOUNTER — Telehealth: Payer: Self-pay | Admitting: *Deleted

## 2010-07-05 NOTE — Telephone Encounter (Signed)
rec rf req for zolpidem 10mg  1 po qhs. # 30. Last filled 06-07-10. Ok to RF?

## 2010-07-05 NOTE — Telephone Encounter (Signed)
OK to fill this prescription with additional refills x3 Thank you!  

## 2010-07-06 MED ORDER — ZOLPIDEM TARTRATE 10 MG PO TABS: 10.0000 mg | ORAL_TABLET | Freq: Every evening | ORAL | Status: DC | PRN

## 2010-11-07 ENCOUNTER — Telehealth: Payer: Self-pay | Admitting: *Deleted

## 2010-11-07 NOTE — Telephone Encounter (Signed)
Rf req for Zolpidem 10mg  1 po qhs prn. # 30. Last filled 10-09-10 Ok to Rf?

## 2010-11-08 MED ORDER — ZOLPIDEM TARTRATE 10 MG PO TABS: 10.0000 mg | ORAL_TABLET | Freq: Every evening | ORAL | Status: DC | PRN

## 2010-11-08 NOTE — Telephone Encounter (Signed)
OK to fill this prescription with additional refills x5 Thank you!  

## 2011-05-01 ENCOUNTER — Telehealth: Payer: Self-pay | Admitting: *Deleted

## 2011-05-01 DIAGNOSIS — Z0389 Encounter for observation for other suspected diseases and conditions ruled out: Secondary | ICD-10-CM

## 2011-05-01 DIAGNOSIS — Z0000 Encounter for general adult medical examination without abnormal findings: Secondary | ICD-10-CM

## 2011-05-01 NOTE — Telephone Encounter (Signed)
Message copied by Merrilyn Puma on Mon May 01, 2011  8:35 AM ------      Message from: COUSIN, SHARON T      Created: Mon Apr 17, 2011  3:56 PM      Regarding: PHY DATE:  06/21/11       Per pt will have lab done early before  phy appt on the same day.

## 2011-05-01 NOTE — Telephone Encounter (Signed)
06/2011 CPE labs entered.

## 2011-05-03 ENCOUNTER — Telehealth: Payer: Self-pay | Admitting: *Deleted

## 2011-05-03 NOTE — Telephone Encounter (Signed)
OK to fill this prescription with additional refills x2 Thank you!  

## 2011-05-03 NOTE — Telephone Encounter (Signed)
Rf req for zolpidem 10 mg 1 po qhs prn. Ok to Rf?

## 2011-05-04 MED ORDER — ZOLPIDEM TARTRATE 10 MG PO TABS: 10.0000 mg | ORAL_TABLET | Freq: Every evening | ORAL | Status: DC | PRN

## 2011-06-21 ENCOUNTER — Other Ambulatory Visit (INDEPENDENT_AMBULATORY_CARE_PROVIDER_SITE_OTHER): Payer: Medicare Other

## 2011-06-21 ENCOUNTER — Encounter: Payer: Self-pay | Admitting: Internal Medicine

## 2011-06-21 ENCOUNTER — Ambulatory Visit (INDEPENDENT_AMBULATORY_CARE_PROVIDER_SITE_OTHER): Payer: Medicare Other | Admitting: Internal Medicine

## 2011-06-21 VITALS — BP 130/85 | HR 80 | Temp 98.3°F | Resp 16 | Ht 68.0 in | Wt 191.0 lb

## 2011-06-21 DIAGNOSIS — J309 Allergic rhinitis, unspecified: Secondary | ICD-10-CM

## 2011-06-21 DIAGNOSIS — Z0389 Encounter for observation for other suspected diseases and conditions ruled out: Secondary | ICD-10-CM

## 2011-06-21 DIAGNOSIS — Z0000 Encounter for general adult medical examination without abnormal findings: Secondary | ICD-10-CM

## 2011-06-21 DIAGNOSIS — I10 Essential (primary) hypertension: Secondary | ICD-10-CM | POA: Diagnosis not present

## 2011-06-21 DIAGNOSIS — Z125 Encounter for screening for malignant neoplasm of prostate: Secondary | ICD-10-CM

## 2011-06-21 DIAGNOSIS — J019 Acute sinusitis, unspecified: Secondary | ICD-10-CM

## 2011-06-21 DIAGNOSIS — Z136 Encounter for screening for cardiovascular disorders: Secondary | ICD-10-CM | POA: Diagnosis not present

## 2011-06-21 LAB — BASIC METABOLIC PANEL
CO2: 29 mEq/L (ref 19–32)
Chloride: 103 mEq/L (ref 96–112)
Sodium: 141 mEq/L (ref 135–145)

## 2011-06-21 LAB — CBC WITH DIFFERENTIAL/PLATELET
Basophils Relative: 0.9 % (ref 0.0–3.0)
Eosinophils Absolute: 0.2 10*3/uL (ref 0.0–0.7)
HCT: 42.1 % (ref 39.0–52.0)
Hemoglobin: 13.1 g/dL (ref 13.0–17.0)
Lymphs Abs: 1.2 10*3/uL (ref 0.7–4.0)
MCHC: 31.2 g/dL (ref 30.0–36.0)
MCV: 60.9 fl — ABNORMAL LOW (ref 78.0–100.0)
Monocytes Absolute: 0.5 10*3/uL (ref 0.1–1.0)
Neutro Abs: 3.7 10*3/uL (ref 1.4–7.7)
RBC: 6.91 Mil/uL — ABNORMAL HIGH (ref 4.22–5.81)

## 2011-06-21 LAB — HEPATIC FUNCTION PANEL
ALT: 18 U/L (ref 0–53)
Alkaline Phosphatase: 55 U/L (ref 39–117)
Bilirubin, Direct: 0.1 mg/dL (ref 0.0–0.3)
Total Bilirubin: 1 mg/dL (ref 0.3–1.2)
Total Protein: 6.6 g/dL (ref 6.0–8.3)

## 2011-06-21 LAB — LIPID PANEL: Total CHOL/HDL Ratio: 4

## 2011-06-21 LAB — URINALYSIS, ROUTINE W REFLEX MICROSCOPIC
Leukocytes, UA: NEGATIVE
Nitrite: NEGATIVE
Specific Gravity, Urine: 1.015 (ref 1.000–1.030)
Total Protein, Urine: NEGATIVE
pH: 6.5 (ref 5.0–8.0)

## 2011-06-21 LAB — PSA: PSA: 9.01 ng/mL — ABNORMAL HIGH (ref 0.10–4.00)

## 2011-06-21 MED ORDER — CEFUROXIME AXETIL 500 MG PO TABS: ORAL_TABLET | ORAL | Status: AC

## 2011-06-21 MED ORDER — FLUTICASONE PROPIONATE 50 MCG/ACT NA SUSP: 2.0000 | Freq: Every day | NASAL | Status: DC

## 2011-06-21 MED ORDER — ZOLPIDEM TARTRATE 10 MG PO TABS: 10.0000 mg | ORAL_TABLET | Freq: Every evening | ORAL | Status: DC | PRN

## 2011-06-21 MED ORDER — VALSARTAN-HYDROCHLOROTHIAZIDE 320-25 MG PO TABS: 1.0000 | ORAL_TABLET | Freq: Every day | ORAL | Status: DC

## 2011-06-21 NOTE — Progress Notes (Signed)
Patient ID: Chad Palmer, male   DOB: [birth year], 66 y.o.   MRN: [id number]  Subjective:    Patient ID: Chad Palmer, male    DOB: 11-30-1945, 66 y.o.   MRN: [id number]  HPI The patient is here for a wellness exam. The patient has been doing well overall without major physical or psychological issues going on lately. The patient needs to address his chronic hypertension, insomnia, allergies that has been well controlled with medicines  BP Readings from Last 3 Encounters:  06/21/11 130/90  05/12/10 110/70  04/23/09 122/82   Wt Readings from Last 3 Encounters:  06/21/11 191 lb (86.637 kg)  05/12/10 186 lb (84.369 kg)  04/23/09 201 lb (91.173 kg)     Review of Systems  Constitutional: Negative for appetite change, fatigue and unexpected weight change.  HENT: Negative for nosebleeds, congestion, sore throat, sneezing, trouble swallowing and neck pain.   Eyes: Negative for itching and visual disturbance.  Respiratory: Negative for cough.   Cardiovascular: Negative for chest pain, palpitations and leg swelling.  Gastrointestinal: Negative for nausea, diarrhea, blood in stool and abdominal distention.  Genitourinary: Negative for frequency and hematuria.  Musculoskeletal: Negative for back pain, joint swelling and gait problem.  Skin: Negative for rash.  Neurological: Negative for dizziness, tremors, speech difficulty and weakness.  Psychiatric/Behavioral: Negative for sleep disturbance, dysphoric mood and agitation. The patient is not nervous/anxious.    Wt Readings from Last 3 Encounters:  06/21/11 191 lb (86.637 kg)  05/12/10 186 lb (84.369 kg)  04/23/09 201 lb (91.173 kg)       Objective:   Physical Exam  Constitutional: He is oriented to person, place, and time. He appears well-developed and well-nourished. No distress.  HENT:  Head: Normocephalic and atraumatic.  Right Ear: External ear normal.  Left Ear: External ear normal.  Nose: Nose normal.  Mouth/Throat:  Oropharynx is clear and moist. No oropharyngeal exudate.  Eyes: Conjunctivae and EOM are normal. Pupils are equal, round, and reactive to light. Right eye exhibits no discharge. Left eye exhibits no discharge. No scleral icterus.  Neck: Normal range of motion. Neck supple. No JVD present. No tracheal deviation present. No thyromegaly present.  Cardiovascular: Normal rate, regular rhythm, normal heart sounds and intact distal pulses.  Exam reveals no gallop and no friction rub.   No murmur heard. Pulmonary/Chest: Effort normal and breath sounds normal. No stridor. No respiratory distress. He has no wheezes. He has no rales. He exhibits no tenderness.  Abdominal: Soft. Bowel sounds are normal. He exhibits no distension and no mass. There is no tenderness. There is no rebound and no guarding.  Genitourinary: Rectum normal, prostate normal and penis normal. Guaiac negative stool. No penile tenderness.  Musculoskeletal: Normal range of motion. He exhibits no edema and no tenderness.  Lymphadenopathy:    He has no cervical adenopathy.  Neurological: He is alert and oriented to person, place, and time. He has normal reflexes. No cranial nerve deficit. He exhibits normal muscle tone. Coordination normal.  Skin: Skin is warm and dry. No rash noted. He is not diaphoretic. No erythema. No pallor.  Psychiatric: He has a normal mood and affect. His behavior is normal. Judgment and thought content normal.       Lab Results  Component Value Date   WBC 5.7 06/21/2011   HGB 13.1 06/21/2011   HCT 42.1 06/21/2011   PLT 135.0* 06/21/2011   CHOL 177 06/21/2011   TRIG 93.0 06/21/2011   HDL 42.60 06/21/2011  ALT 18 06/21/2011   AST 20 06/21/2011   NA 141 06/21/2011   K 4.9 06/21/2011   CL 103 06/21/2011   CREATININE 1.1 06/21/2011   BUN 17 06/21/2011   CO2 29 06/21/2011   TSH 1.42 06/21/2011   PSA 9.01* 06/21/2011      Assessment & Plan:

## 2011-06-21 NOTE — Assessment & Plan Note (Signed)

## 2011-06-21 NOTE — Assessment & Plan Note (Signed)
Continue with current prescription therapy as reflected on the Med list.  

## 2011-06-21 NOTE — Assessment & Plan Note (Signed)
Start Abx 

## 2011-08-04 ENCOUNTER — Telehealth: Payer: Self-pay | Admitting: *Deleted

## 2011-08-04 NOTE — Telephone Encounter (Signed)
Rf req for Zolpidem 10 mg 1 po qhs. Ok to RF?

## 2011-08-06 NOTE — Telephone Encounter (Signed)
OK to fill this prescription with additional refills x5 Thank you!  

## 2011-08-07 DIAGNOSIS — H2510 Age-related nuclear cataract, unspecified eye: Secondary | ICD-10-CM | POA: Diagnosis not present

## 2011-08-07 DIAGNOSIS — H26019 Infantile and juvenile cortical, lamellar, or zonular cataract, unspecified eye: Secondary | ICD-10-CM | POA: Diagnosis not present

## 2011-08-07 MED ORDER — ZOLPIDEM TARTRATE 10 MG PO TABS: 10.0000 mg | ORAL_TABLET | Freq: Every evening | ORAL | Status: DC | PRN

## 2011-08-07 NOTE — Telephone Encounter (Signed)
Done

## 2011-09-27 DIAGNOSIS — N529 Male erectile dysfunction, unspecified: Secondary | ICD-10-CM | POA: Diagnosis not present

## 2011-09-27 DIAGNOSIS — N400 Enlarged prostate without lower urinary tract symptoms: Secondary | ICD-10-CM | POA: Diagnosis not present

## 2011-09-27 DIAGNOSIS — R972 Elevated prostate specific antigen [PSA]: Secondary | ICD-10-CM | POA: Diagnosis not present

## 2011-11-08 DIAGNOSIS — R972 Elevated prostate specific antigen [PSA]: Secondary | ICD-10-CM | POA: Diagnosis not present

## 2011-12-06 DIAGNOSIS — H2510 Age-related nuclear cataract, unspecified eye: Secondary | ICD-10-CM | POA: Diagnosis not present

## 2011-12-12 DIAGNOSIS — H35379 Puckering of macula, unspecified eye: Secondary | ICD-10-CM | POA: Diagnosis not present

## 2011-12-12 DIAGNOSIS — H43819 Vitreous degeneration, unspecified eye: Secondary | ICD-10-CM | POA: Diagnosis not present

## 2011-12-12 DIAGNOSIS — H2510 Age-related nuclear cataract, unspecified eye: Secondary | ICD-10-CM | POA: Diagnosis not present

## 2011-12-13 DIAGNOSIS — H2510 Age-related nuclear cataract, unspecified eye: Secondary | ICD-10-CM | POA: Diagnosis not present

## 2011-12-27 DIAGNOSIS — H2510 Age-related nuclear cataract, unspecified eye: Secondary | ICD-10-CM | POA: Diagnosis not present

## 2012-02-03 ENCOUNTER — Other Ambulatory Visit: Payer: Self-pay | Admitting: Internal Medicine

## 2012-02-05 NOTE — Telephone Encounter (Signed)
Ok to Rf? 

## 2012-02-09 ENCOUNTER — Other Ambulatory Visit: Payer: Self-pay | Admitting: Internal Medicine

## 2012-05-06 DIAGNOSIS — Z961 Presence of intraocular lens: Secondary | ICD-10-CM | POA: Diagnosis not present

## 2012-06-11 DIAGNOSIS — Z961 Presence of intraocular lens: Secondary | ICD-10-CM | POA: Diagnosis not present

## 2012-06-11 DIAGNOSIS — H35369 Drusen (degenerative) of macula, unspecified eye: Secondary | ICD-10-CM | POA: Diagnosis not present

## 2012-06-11 DIAGNOSIS — H35379 Puckering of macula, unspecified eye: Secondary | ICD-10-CM | POA: Diagnosis not present

## 2012-06-11 DIAGNOSIS — H43819 Vitreous degeneration, unspecified eye: Secondary | ICD-10-CM | POA: Diagnosis not present

## 2012-07-22 ENCOUNTER — Other Ambulatory Visit: Payer: Self-pay | Admitting: Internal Medicine

## 2012-09-16 ENCOUNTER — Other Ambulatory Visit: Payer: Self-pay | Admitting: Internal Medicine

## 2012-09-16 NOTE — Telephone Encounter (Signed)
Ok to refill? Last OV 5.15.13 Last filled 12.28.13

## 2012-09-27 ENCOUNTER — Other Ambulatory Visit: Payer: Self-pay | Admitting: Internal Medicine

## 2012-11-11 DIAGNOSIS — N400 Enlarged prostate without lower urinary tract symptoms: Secondary | ICD-10-CM | POA: Diagnosis not present

## 2012-11-11 DIAGNOSIS — R972 Elevated prostate specific antigen [PSA]: Secondary | ICD-10-CM | POA: Diagnosis not present

## 2012-12-10 DIAGNOSIS — H35379 Puckering of macula, unspecified eye: Secondary | ICD-10-CM | POA: Diagnosis not present

## 2012-12-10 DIAGNOSIS — H43819 Vitreous degeneration, unspecified eye: Secondary | ICD-10-CM | POA: Diagnosis not present

## 2013-03-05 ENCOUNTER — Encounter: Payer: Self-pay | Admitting: Gastroenterology

## 2013-03-24 ENCOUNTER — Ambulatory Visit (INDEPENDENT_AMBULATORY_CARE_PROVIDER_SITE_OTHER): Payer: Medicare Other | Admitting: Internal Medicine

## 2013-03-24 ENCOUNTER — Other Ambulatory Visit (INDEPENDENT_AMBULATORY_CARE_PROVIDER_SITE_OTHER): Payer: Medicare Other

## 2013-03-24 ENCOUNTER — Encounter: Payer: Self-pay | Admitting: Internal Medicine

## 2013-03-24 VITALS — BP 135/85 | HR 76 | Temp 97.6°F | Resp 16 | Ht 68.0 in | Wt 200.0 lb

## 2013-03-24 DIAGNOSIS — E785 Hyperlipidemia, unspecified: Secondary | ICD-10-CM

## 2013-03-24 DIAGNOSIS — N400 Enlarged prostate without lower urinary tract symptoms: Secondary | ICD-10-CM | POA: Diagnosis not present

## 2013-03-24 DIAGNOSIS — I10 Essential (primary) hypertension: Secondary | ICD-10-CM

## 2013-03-24 DIAGNOSIS — Z0000 Encounter for general adult medical examination without abnormal findings: Secondary | ICD-10-CM | POA: Diagnosis not present

## 2013-03-24 DIAGNOSIS — L309 Dermatitis, unspecified: Secondary | ICD-10-CM | POA: Insufficient documentation

## 2013-03-24 DIAGNOSIS — Z860100 Personal history of colon polyps, unspecified: Secondary | ICD-10-CM

## 2013-03-24 DIAGNOSIS — Z23 Encounter for immunization: Secondary | ICD-10-CM | POA: Diagnosis not present

## 2013-03-24 DIAGNOSIS — M545 Low back pain, unspecified: Secondary | ICD-10-CM

## 2013-03-24 DIAGNOSIS — J309 Allergic rhinitis, unspecified: Secondary | ICD-10-CM

## 2013-03-24 DIAGNOSIS — L259 Unspecified contact dermatitis, unspecified cause: Secondary | ICD-10-CM

## 2013-03-24 DIAGNOSIS — Z136 Encounter for screening for cardiovascular disorders: Secondary | ICD-10-CM

## 2013-03-24 LAB — URINALYSIS
Bilirubin Urine: NEGATIVE
Hgb urine dipstick: NEGATIVE
KETONES UR: NEGATIVE
LEUKOCYTES UA: NEGATIVE
Nitrite: NEGATIVE
SPECIFIC GRAVITY, URINE: 1.015 (ref 1.000–1.030)
Total Protein, Urine: NEGATIVE
UROBILINOGEN UA: 0.2 (ref 0.0–1.0)
Urine Glucose: NEGATIVE
pH: 5.5 (ref 5.0–8.0)

## 2013-03-24 LAB — LIPID PANEL
CHOL/HDL RATIO: 5
Cholesterol: 191 mg/dL (ref 0–200)
HDL: 36.9 mg/dL — AB (ref >39.00)
LDL CALC: 118 mg/dL — AB (ref 0–99)
TRIGLYCERIDES: 182 mg/dL — AB (ref 0.0–149.0)
VLDL: 36.4 mg/dL (ref 0.0–40.0)

## 2013-03-24 LAB — CBC WITH DIFFERENTIAL/PLATELET
BASOS ABS: 0 10*3/uL (ref 0.0–0.1)
Basophils Relative: 0.5 % (ref 0.0–3.0)
Eosinophils Absolute: 0.2 10*3/uL (ref 0.0–0.7)
Eosinophils Relative: 2.8 % (ref 0.0–5.0)
HEMATOCRIT: 45.7 % (ref 39.0–52.0)
Hemoglobin: 14 g/dL (ref 13.0–17.0)
Lymphocytes Relative: 22.3 % (ref 12.0–46.0)
Lymphs Abs: 1.3 10*3/uL (ref 0.7–4.0)
MCHC: 30.6 g/dL (ref 30.0–36.0)
MONO ABS: 0.4 10*3/uL (ref 0.1–1.0)
Monocytes Relative: 7.5 % (ref 3.0–12.0)
Neutro Abs: 3.9 10*3/uL (ref 1.4–7.7)
Neutrophils Relative %: 66.9 % (ref 43.0–77.0)
PLATELETS: 133 10*3/uL — AB (ref 150.0–400.0)
RBC: 7.46 Mil/uL — ABNORMAL HIGH (ref 4.22–5.81)
RDW: 16.3 % — AB (ref 11.5–14.6)
WBC: 5.9 10*3/uL (ref 4.5–10.5)

## 2013-03-24 LAB — BASIC METABOLIC PANEL
BUN: 13 mg/dL (ref 6–23)
CHLORIDE: 105 meq/L (ref 96–112)
CO2: 29 mEq/L (ref 19–32)
Calcium: 9.1 mg/dL (ref 8.4–10.5)
Creatinine, Ser: 1.2 mg/dL (ref 0.4–1.5)
GFR: 63.09 mL/min (ref >60.00)
GLUCOSE: 100 mg/dL — AB (ref 70–99)
POTASSIUM: 3.9 meq/L (ref 3.5–5.1)
Sodium: 141 mEq/L (ref 135–145)

## 2013-03-24 LAB — HEPATIC FUNCTION PANEL
ALT: 20 U/L (ref 0–53)
AST: 20 U/L (ref 0–37)
Albumin: 4.3 g/dL (ref 3.5–5.2)
Alkaline Phosphatase: 54 U/L (ref 39–117)
BILIRUBIN TOTAL: 1.2 mg/dL (ref 0.3–1.2)
Bilirubin, Direct: 0.1 mg/dL (ref 0.0–0.3)
Total Protein: 7.2 g/dL (ref 6.0–8.3)

## 2013-03-24 LAB — TSH: TSH: 2.33 u[IU]/mL (ref 0.35–5.50)

## 2013-03-24 MED ORDER — ZOLPIDEM TARTRATE 10 MG PO TABS: 10.0000 mg | ORAL_TABLET | Freq: Every evening | ORAL | Status: DC | PRN

## 2013-03-24 MED ORDER — VALSARTAN-HYDROCHLOROTHIAZIDE 320-25 MG PO TABS: 1.0000 | ORAL_TABLET | Freq: Every day | ORAL | Status: DC

## 2013-03-24 MED ORDER — TRIAMCINOLONE ACETONIDE 0.5 % EX CREA: 1.0000 "application " | TOPICAL_CREAM | Freq: Three times a day (TID) | CUTANEOUS | Status: DC

## 2013-03-24 MED ORDER — FLUTICASONE PROPIONATE 50 MCG/ACT NA SUSP: 2.0000 | Freq: Every day | NASAL | Status: DC

## 2013-03-24 NOTE — Assessment & Plan Note (Signed)
Colonosc this year due - he will schedule

## 2013-03-24 NOTE — Assessment & Plan Note (Signed)
Continue with current prescription therapy as reflected on the Med list.  

## 2013-03-24 NOTE — Assessment & Plan Note (Signed)
Stretch  

## 2013-03-24 NOTE — Progress Notes (Signed)
Subjective:    HPI The patient is here for a wellness exam. The patient has been doing well overall without major physical or psychological issues going on lately.  The patient needs to address his chronic hypertension. His insomnia, allergies - worse  LBP  BP Readings from Last 3 Encounters:  03/24/13 135/85  06/21/11 130/85  05/12/10 110/70   Wt Readings from Last 3 Encounters:  03/24/13 200 lb (90.719 kg)  06/21/11 191 lb (86.637 kg)  05/12/10 186 lb (84.369 kg)     Review of Systems  Constitutional: Negative for appetite change, fatigue and unexpected weight change.  HENT: Negative for congestion, nosebleeds, sneezing, sore throat and trouble swallowing.   Eyes: Negative for itching and visual disturbance.  Respiratory: Negative for cough.   Cardiovascular: Negative for chest pain, palpitations and leg swelling.  Gastrointestinal: Negative for nausea, diarrhea, blood in stool and abdominal distention.  Genitourinary: Negative for frequency and hematuria.  Musculoskeletal: Negative for back pain, gait problem, joint swelling and neck pain.  Skin: Negative for rash.  Neurological: Negative for dizziness, tremors, speech difficulty and weakness.  Psychiatric/Behavioral: Negative for sleep disturbance, dysphoric mood and agitation. The patient is not nervous/anxious.         Objective:   Physical Exam  Constitutional: He is oriented to person, place, and time. He appears well-developed and well-nourished. No distress.  HENT:  Head: Normocephalic and atraumatic.  Right Ear: External ear normal.  Left Ear: External ear normal.  Nose: Nose normal.  Mouth/Throat: Oropharynx is clear and moist. No oropharyngeal exudate.  Eyes: Conjunctivae and EOM are normal. Pupils are equal, round, and reactive to light. Right eye exhibits no discharge. Left eye exhibits no discharge. No scleral icterus.  Neck: Normal range of motion. Neck supple. No JVD present. No tracheal deviation  present. No thyromegaly present.  Cardiovascular: Normal rate, regular rhythm, normal heart sounds and intact distal pulses.  Exam reveals no gallop and no friction rub.   No murmur heard. Pulmonary/Chest: Effort normal and breath sounds normal. No stridor. No respiratory distress. He has no wheezes. He has no rales. He exhibits no tenderness.  Abdominal: Soft. Bowel sounds are normal. He exhibits no distension and no mass. There is no tenderness. There is no rebound and no guarding.  Genitourinary: Rectum normal, prostate normal and penis normal. Guaiac negative stool. No penile tenderness.  Musculoskeletal: Normal range of motion. He exhibits no edema and no tenderness.  Lymphadenopathy:    He has no cervical adenopathy.  Neurological: He is alert and oriented to person, place, and time. He has normal reflexes. No cranial nerve deficit. He exhibits normal muscle tone. Coordination normal.  Skin: Skin is warm and dry. No rash noted. He is not diaphoretic. No erythema. No pallor.  Psychiatric: He has a normal mood and affect. His behavior is normal. Judgment and thought content normal.  Dry skin around his fingernails     Lab Results  Component Value Date   WBC 5.9 03/24/2013   HGB 14.0 03/24/2013   HCT 45.7 03/24/2013   PLT 133.0* 03/24/2013   CHOL 191 03/24/2013   TRIG 182.0* 03/24/2013   HDL 36.90* 03/24/2013   ALT 20 03/24/2013   AST 20 03/24/2013   NA 141 03/24/2013   K 3.9 03/24/2013   CL 105 03/24/2013   CREATININE 1.2 03/24/2013   BUN 13 03/24/2013   CO2 29 03/24/2013   TSH 2.33 03/24/2013   PSA 9.01* 06/21/2011      Assessment &  Plan:

## 2013-03-24 NOTE — Progress Notes (Signed)
Pre visit review using our clinic review tool, if applicable. No additional management support is needed unless otherwise documented below in the visit note. 

## 2013-03-24 NOTE — Assessment & Plan Note (Signed)
Dairy free Continue with current prescription therapy as reflected on the Med list.

## 2013-03-24 NOTE — Assessment & Plan Note (Signed)
Triamc oint Rx bid

## 2013-03-24 NOTE — Patient Instructions (Signed)
   Milk free trial (no milk, ice cream, cheese and yogurt) for 4-6 weeks. OK to use almond, coconut, rice milk. "Almond breeze" brand tastes good.  Netty pot  [email or web address].com

## 2013-03-24 NOTE — Assessment & Plan Note (Addendum)
The patient is here for annual Medicare wellness examination and management of other chronic and acute problems.   The risk factors are reflected in the social history.  The roster of all physicians providing medical care to patient - is listed in the Snapshot section of the chart.  Activities of daily living:  The patient is 100% inedpendent in all ADLs: dressing, toileting, feeding as well as independent mobility  Home safety : The patient has smoke detectors in the home. They wear seatbelts. There is no violence in the home.   There is no risks for hepatitis, STDs or HIV. There is no   history of blood transfusion. They have no travel history to infectious disease endemic areas of the world.  The patient has  seen their dentist in the last 12 month. They have  seen their eye doctor in the last year. They deny  Any major hearing difficulty and have not had audiologic testing in the last year.  They do not  have excessive sun exposure. Discussed the need for sun protection: hats, long sleeves and use of sunscreen if there is significant sun exposure.   Diet: the importance of a healthy diet is discussed. They do have a reasonably healthy  diet.  The patient has a fairly regular exercise program of a mixed nature: walking, yard work, etc.The benefits of regular aerobic exercise were discussed.  Depression screen: there are no signs or vegative symptoms of depression- irritability, change in appetite, anhedonia, sadness/tearfullness.  Cognitive assessment: the patient manages all their financial and personal affairs and is actively engaged. They could relate day,date,year and events; recalled 3/3 objects at 3 minutes  The following portions of the patient's history were reviewed and updated as appropriate: allergies, current medications, past family history, past medical history,  past surgical history, past social history  and problem list.  Vision, hearing, body mass index were assessed and  reviewed.   During the course of the visit the patient was educated and counseled about appropriate screening and preventive services including : fall prevention , diabetes screening, nutrition counseling, colorectal cancer screening, and recommended immunizations.  Form for racing filled out

## 2013-03-24 NOTE — Assessment & Plan Note (Signed)
PSA 8.65

## 2013-03-27 NOTE — Progress Notes (Signed)
Spoke with pt advised of MDs result note. 

## 2013-06-10 DIAGNOSIS — H35379 Puckering of macula, unspecified eye: Secondary | ICD-10-CM | POA: Diagnosis not present

## 2013-06-10 DIAGNOSIS — H43819 Vitreous degeneration, unspecified eye: Secondary | ICD-10-CM | POA: Diagnosis not present

## 2013-11-21 DIAGNOSIS — Z23 Encounter for immunization: Secondary | ICD-10-CM | POA: Diagnosis not present

## 2013-11-25 ENCOUNTER — Encounter: Payer: Self-pay | Admitting: Gastroenterology

## 2013-12-10 DIAGNOSIS — N5201 Erectile dysfunction due to arterial insufficiency: Secondary | ICD-10-CM | POA: Diagnosis not present

## 2013-12-10 DIAGNOSIS — R972 Elevated prostate specific antigen [PSA]: Secondary | ICD-10-CM | POA: Diagnosis not present

## 2013-12-10 DIAGNOSIS — N400 Enlarged prostate without lower urinary tract symptoms: Secondary | ICD-10-CM | POA: Diagnosis not present

## 2013-12-19 ENCOUNTER — Other Ambulatory Visit: Payer: Self-pay | Admitting: Internal Medicine

## 2014-01-13 ENCOUNTER — Ambulatory Visit (AMBULATORY_SURGERY_CENTER): Payer: Self-pay

## 2014-01-13 VITALS — Ht 68.0 in | Wt 192.0 lb

## 2014-01-13 DIAGNOSIS — Z86010 Personal history of colonic polyps: Secondary | ICD-10-CM

## 2014-01-13 MED ORDER — MOVIPREP 100 G PO SOLR: ORAL | Status: DC

## 2014-01-13 NOTE — Progress Notes (Signed)
Per pt, no allergies to soy or egg products.Pt not taking any weight loss meds or using  O2 at home. 

## 2014-01-27 ENCOUNTER — Ambulatory Visit (AMBULATORY_SURGERY_CENTER): Payer: Medicare Other | Admitting: Gastroenterology

## 2014-01-27 ENCOUNTER — Encounter: Payer: Self-pay | Admitting: Gastroenterology

## 2014-01-27 VITALS — BP 107/74 | HR 63 | Temp 97.5°F | Resp 14 | Ht 68.0 in | Wt 192.0 lb

## 2014-01-27 DIAGNOSIS — D124 Benign neoplasm of descending colon: Secondary | ICD-10-CM

## 2014-01-27 DIAGNOSIS — Z86010 Personal history of colonic polyps: Secondary | ICD-10-CM

## 2014-01-27 DIAGNOSIS — Z1211 Encounter for screening for malignant neoplasm of colon: Secondary | ICD-10-CM | POA: Diagnosis not present

## 2014-01-27 DIAGNOSIS — K644 Residual hemorrhoidal skin tags: Secondary | ICD-10-CM

## 2014-01-27 DIAGNOSIS — K5730 Diverticulosis of large intestine without perforation or abscess without bleeding: Secondary | ICD-10-CM

## 2014-01-27 DIAGNOSIS — I10 Essential (primary) hypertension: Secondary | ICD-10-CM | POA: Diagnosis not present

## 2014-01-27 MED ORDER — SODIUM CHLORIDE 0.9 % IV SOLN: 500.0000 mL | INTRAVENOUS | Status: DC

## 2014-01-27 NOTE — Progress Notes (Signed)
Patient awakening,vss,report to rn 

## 2014-01-27 NOTE — Op Note (Signed)
[place]  [address] [place], [id number]   COLONOSCOPY PROCEDURE REPORT  PATIENT: Chad Palmer, Chad Palmer  MR#: [id number] BIRTHDATE: [birth year] , 68  yrs. old GENDER: male ENDOSCOPIST: Milus Banister, MD PROCEDURE DATE:  01/27/2014 PROCEDURE:   Colonoscopy with snare polypectomy First Screening Colonoscopy - Avg.  risk and is 50 yrs.  old or older - No.  Prior Negative Screening - Now for repeat screening. N/A  History of Adenoma - Now for follow-up colonoscopy & has been > or = to 3 yrs.  No.  It has been less than 3 yrs since last colonoscopy.  Medical reason.  Polyps Removed Today? Yes. ASA CLASS:   Class II INDICATIONS:"multiple polyps" "1-62mm" removed Dr.  Sharlett Iles 2009, none sent to pathology. MEDICATIONS: Monitored anesthesia care and Propofol 150 mg IV  DESCRIPTION OF PROCEDURE:   After the risks benefits and alternatives of the procedure were thoroughly explained, informed consent was obtained.  The digital rectal exam revealed no abnormalities of the rectum.   The LB [id number]  endoscope was introduced through the anus and advanced to the cecum, which was identified by both the appendix and ileocecal valve. No adverse events experienced.   The quality of the prep was excellent.  The instrument was then slowly withdrawn as the colon was fully examined.  COLON FINDINGS: One polyp was found, removed and sent to pathology. This was sessile, 41mm across, located in descending segment, removed with cold snare.  There were numerous diverticulum in the left colon.  There were medium sized, non-thrombosed external hemorrhoids.  The examination was otherwise normal.  Retroflexed views revealed no abnormalities. The time to cecum=1 minutes 47 seconds.  Withdrawal time=8 minutes 45 seconds.  The scope was withdrawn and the procedure completed. COMPLICATIONS: There were no immediate complications.  ENDOSCOPIC IMPRESSION: One polyp was found, removed and  sent to pathology. There were numerous diverticulum in the left colon. There were medium sized, non-thrombosed external hemorrhoids. The examination was otherwise normal  RECOMMENDATIONS: If the polyp(s) removed today are proven to be adenomatous (pre-cancerous) polyps, you will need a repeat colonoscopy in 5 years.  Otherwise you should continue to follow colorectal cancer screening guidelines for "routine risk" patients with colonoscopy in 10 years.  You will receive a letter within 1-2 weeks with the results of your biopsy as well as final recommendations.  Please call my office if you have not received a letter after 3 weeks.  eSigned:  Milus Banister, MD 01/27/2014 9:50 AM

## 2014-01-27 NOTE — Progress Notes (Signed)
Called to room to assist during endoscopic procedure.  Patient ID and intended procedure confirmed with present staff. Received instructions for my participation in the procedure from the performing physician.  

## 2014-01-27 NOTE — Patient Instructions (Addendum)
YOU HAD AN ENDOSCOPIC PROCEDURE TODAY AT [facility]: Refer to the procedure report that was given to you for any specific questions about what was found during the examination.  If the procedure report does not answer your questions, please call your gastroenterologist to clarify.  If you requested that your care partner not be given the details of your procedure findings, then the procedure report has been included in a sealed envelope for you to review at your convenience later.  YOU SHOULD EXPECT: Some feelings of bloating in the abdomen. Passage of more gas than usual.  Walking can help get rid of the air that was put into your GI tract during the procedure and reduce the bloating. If you had a lower endoscopy (such as a colonoscopy or flexible sigmoidoscopy) you may notice spotting of blood in your stool or on the toilet paper. If you underwent a bowel prep for your procedure, then you may not have a normal bowel movement for a few days.  DIET: Your first meal following the procedure should be a light meal and then it is ok to progress to your normal diet.  A half-sandwich or bowl of soup is an example of a good first meal.  Heavy or fried foods are harder to digest and may make you feel nauseous or bloated.  Likewise meals heavy in dairy and vegetables can cause extra gas to form and this can also increase the bloating.  Drink plenty of fluids but you should avoid alcoholic beverages for 24 hours. Try to increase the fiber in your diet due to the Diverticulosis and Hemmorrhoidsl  ACTIVITY: Your care partner should take you home directly after the procedure.  You should plan to take it easy, moving slowly for the rest of the day.  You can resume normal activity the day after the procedure however you should NOT DRIVE or use heavy machinery for 24 hours (because of the sedation medicines used during the test).    SYMPTOMS TO REPORT IMMEDIATELY: A gastroenterologist can be reached at  any hour.  During normal business hours, 8:30 AM to 5:00 PM Monday through Friday, call [phone].  After hours and on weekends, please call the GI answering service at [phone] who will take a message and have the physician on call contact you.   Following lower endoscopy (colonoscopy or flexible sigmoidoscopy):  Excessive amounts of blood in the stool  Significant tenderness or worsening of abdominal pains  Swelling of the abdomen that is new, acute  Fever of 100F or higher  FOLLOW UP: If any biopsies were taken you will be contacted by phone or by letter within the next 1-3 weeks.  Call your gastroenterologist if you have not heard about the biopsies in 3 weeks.  Our staff will call the home number listed on your records the next business day following your procedure to check on you and address any questions or concerns that you may have at that time regarding the information given to you following your procedure. This is a courtesy call and so if there is no answer at the home number and we have not heard from you through the emergency physician on call, we will assume that you have returned to your regular daily activities without incident.  SIGNATURES/CONFIDENTIALITY: You and/or your care partner have signed paperwork which will be entered into your electronic medical record.  These signatures attest to the fact that that the information above on your After Visit Summary  has been reviewed and is understood.  Full responsibility of the confidentiality of this discharge information lies with you and/or your care-partner.  Please, read all of the handouts given to you by your recovery room nurse.

## 2014-01-28 ENCOUNTER — Telehealth: Payer: Self-pay | Admitting: *Deleted

## 2014-01-28 NOTE — Telephone Encounter (Signed)
  Follow up Call-  No flowsheet data found.  LMOM

## 2014-02-03 ENCOUNTER — Encounter: Payer: Self-pay | Admitting: Gastroenterology

## 2014-03-25 ENCOUNTER — Encounter: Payer: Self-pay | Admitting: Internal Medicine

## 2014-03-25 ENCOUNTER — Ambulatory Visit (INDEPENDENT_AMBULATORY_CARE_PROVIDER_SITE_OTHER): Payer: Medicare Other | Admitting: Internal Medicine

## 2014-03-25 ENCOUNTER — Ambulatory Visit (INDEPENDENT_AMBULATORY_CARE_PROVIDER_SITE_OTHER)
Admission: RE | Admit: 2014-03-25 | Discharge: 2014-03-25 | Disposition: A | Discharge status: Home / Self Care | Payer: Medicare Other | Source: Ambulatory Visit | Attending: Internal Medicine | Admitting: Internal Medicine

## 2014-03-25 ENCOUNTER — Other Ambulatory Visit (INDEPENDENT_AMBULATORY_CARE_PROVIDER_SITE_OTHER): Payer: Medicare Other

## 2014-03-25 VITALS — BP 150/94 | HR 77 | Temp 98.1°F | Ht 68.0 in | Wt 194.0 lb

## 2014-03-25 DIAGNOSIS — R0789 Other chest pain: Secondary | ICD-10-CM

## 2014-03-25 DIAGNOSIS — M545 Low back pain, unspecified: Secondary | ICD-10-CM

## 2014-03-25 DIAGNOSIS — N400 Enlarged prostate without lower urinary tract symptoms: Secondary | ICD-10-CM | POA: Diagnosis not present

## 2014-03-25 DIAGNOSIS — I781 Nevus, non-neoplastic: Secondary | ICD-10-CM | POA: Diagnosis not present

## 2014-03-25 DIAGNOSIS — I10 Essential (primary) hypertension: Secondary | ICD-10-CM | POA: Diagnosis not present

## 2014-03-25 DIAGNOSIS — Z0000 Encounter for general adult medical examination without abnormal findings: Secondary | ICD-10-CM | POA: Diagnosis not present

## 2014-03-25 DIAGNOSIS — D1801 Hemangioma of skin and subcutaneous tissue: Secondary | ICD-10-CM

## 2014-03-25 LAB — LIPID PANEL
CHOLESTEROL: 191 mg/dL (ref 0–200)
HDL: 44.5 mg/dL (ref >39.00)
LDL Cholesterol: 126 mg/dL — ABNORMAL HIGH (ref 0–99)
NonHDL: 146.5
Total CHOL/HDL Ratio: 4
Triglycerides: 103 mg/dL (ref 0.0–149.0)
VLDL: 20.6 mg/dL (ref 0.0–40.0)

## 2014-03-25 LAB — CBC WITH DIFFERENTIAL/PLATELET
BASOS PCT: 0.7 % (ref 0.0–3.0)
Basophils Absolute: 0 10*3/uL (ref 0.0–0.1)
Eosinophils Absolute: 0.1 10*3/uL (ref 0.0–0.7)
Eosinophils Relative: 2.2 % (ref 0.0–5.0)
HCT: 42.6 % (ref 39.0–52.0)
Hemoglobin: 13.6 g/dL (ref 13.0–17.0)
LYMPHS ABS: 1.4 10*3/uL (ref 0.7–4.0)
Lymphocytes Relative: 25.2 % (ref 12.0–46.0)
MCHC: 32 g/dL (ref 30.0–36.0)
MCV: 58.5 fl — ABNORMAL LOW (ref 78.0–100.0)
MONO ABS: 0.5 10*3/uL (ref 0.1–1.0)
Monocytes Relative: 9.3 % (ref 3.0–12.0)
NEUTROS ABS: 3.3 10*3/uL (ref 1.4–7.7)
Neutrophils Relative %: 62.6 % (ref 43.0–77.0)
Platelets: 135 10*3/uL — ABNORMAL LOW (ref 150.0–400.0)
RBC: 7.27 Mil/uL — AB (ref 4.22–5.81)
RDW: 17 % — ABNORMAL HIGH (ref 11.5–15.5)
WBC: 5.4 10*3/uL (ref 4.0–10.5)

## 2014-03-25 LAB — HEPATIC FUNCTION PANEL
ALBUMIN: 4.3 g/dL (ref 3.5–5.2)
ALT: 13 U/L (ref 0–53)
AST: 13 U/L (ref 0–37)
Alkaline Phosphatase: 57 U/L (ref 39–117)
Bilirubin, Direct: 0.3 mg/dL (ref 0.0–0.3)
TOTAL PROTEIN: 7 g/dL (ref 6.0–8.3)
Total Bilirubin: 1.1 mg/dL (ref 0.2–1.2)

## 2014-03-25 LAB — URINALYSIS
Bilirubin Urine: NEGATIVE
HGB URINE DIPSTICK: NEGATIVE
Ketones, ur: NEGATIVE
Leukocytes, UA: NEGATIVE
NITRITE: NEGATIVE
PH: 6 (ref 5.0–8.0)
Specific Gravity, Urine: <=1.005 — AB (ref 1.000–1.030)
Total Protein, Urine: NEGATIVE
UROBILINOGEN UA: 0.2 (ref 0.0–1.0)
Urine Glucose: NEGATIVE

## 2014-03-25 LAB — BASIC METABOLIC PANEL
BUN: 15 mg/dL (ref 6–23)
CHLORIDE: 105 meq/L (ref 96–112)
CO2: 27 mEq/L (ref 19–32)
CREATININE: 1.16 mg/dL (ref 0.40–1.50)
Calcium: 9.7 mg/dL (ref 8.4–10.5)
GFR: 66.05 mL/min (ref >60.00)
GLUCOSE: 97 mg/dL (ref 70–99)
POTASSIUM: 4.3 meq/L (ref 3.5–5.1)
Sodium: 141 mEq/L (ref 135–145)

## 2014-03-25 LAB — TSH: TSH: 1.48 u[IU]/mL (ref 0.35–4.50)

## 2014-03-25 MED ORDER — FLUTICASONE PROPIONATE 50 MCG/ACT NA SUSP: 2.0000 | Freq: Every day | NASAL | Status: DC

## 2014-03-25 MED ORDER — VALSARTAN-HYDROCHLOROTHIAZIDE 320-25 MG PO TABS: 1.0000 | ORAL_TABLET | Freq: Every day | ORAL | Status: DC

## 2014-03-25 MED ORDER — ZOLPIDEM TARTRATE 10 MG PO TABS: 10.0000 mg | ORAL_TABLET | Freq: Every day | ORAL | Status: DC

## 2014-03-25 NOTE — Assessment & Plan Note (Signed)
Continue with current prescription therapy as reflected on the Med list.  

## 2014-03-25 NOTE — Progress Notes (Signed)
Subjective:    HPI The patient is here for a wellness exam. 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 His insomnia, allergies - worse  LBP, R CP in the back w/ROM 5-6/10 at times  BP Readings from Last 3 Encounters:  03/25/14 150/94  01/27/14 107/74  03/24/13 135/85   Wt Readings from Last 3 Encounters:  03/25/14 194 lb (87.998 kg)  01/27/14 192 lb (87.091 kg)  01/13/14 192 lb (87.091 kg)     Review of Systems  Constitutional: Negative for appetite change, fatigue and unexpected weight change.  HENT: Negative for congestion, nosebleeds, sneezing, sore throat and trouble swallowing.   Eyes: Negative for itching and visual disturbance.  Respiratory: Negative for cough.   Cardiovascular: Negative for chest pain, palpitations and leg swelling.  Gastrointestinal: Negative for nausea, diarrhea, blood in stool and abdominal distention.  Genitourinary: Negative for frequency and hematuria.  Musculoskeletal: Negative for back pain, joint swelling, gait problem and neck pain.  Skin: Negative for rash.  Neurological: Negative for dizziness, tremors, speech difficulty and weakness.  Psychiatric/Behavioral: Negative for sleep disturbance, dysphoric mood and agitation. The patient is not nervous/anxious.         Objective:   Physical Exam  Constitutional: He is oriented to person, place, and time. He appears well-developed and well-nourished. No distress.  HENT:  Head: Normocephalic and atraumatic.  Right Ear: External ear normal.  Left Ear: External ear normal.  Nose: Nose normal.  Mouth/Throat: Oropharynx is clear and moist. No oropharyngeal exudate.  Eyes: Conjunctivae and EOM are normal. Pupils are equal, round, and reactive to light. Right eye  exhibits no discharge. Left eye exhibits no discharge. No scleral icterus.  Neck: Normal range of motion. Neck supple. No JVD present. No tracheal deviation present. No thyromegaly present.  Cardiovascular: Normal rate, regular rhythm, normal heart sounds and intact distal pulses.  Exam reveals no gallop and no friction rub.   No murmur heard. Pulmonary/Chest: Effort normal and breath sounds normal. No stridor. No respiratory distress. He has no wheezes. He has no rales. He exhibits no tenderness.  Abdominal: Soft. Bowel sounds are normal. He exhibits no distension and no mass. There is no tenderness. There is no rebound and no guarding.  Musculoskeletal: Normal range of motion. He exhibits no edema or tenderness.  Lymphadenopathy:    He has no cervical adenopathy.  Neurological: He is alert and oriented to person, place, and time. He has normal reflexes. No cranial nerve deficit. He exhibits normal muscle tone. Coordination normal.  Skin: Skin is warm and dry. No rash noted. He is not diaphoretic. No erythema. No pallor.  Psychiatric: He has a normal mood and affect. His behavior is normal. Judgment and thought content normal.  Dry skin around his fingernails Multiple cherry angiomas R post dist chest wall is sensitive Rectal - per Dr Diona Fanti     Lab Results  Component Value Date   WBC 5.9 03/24/2013   HGB 14.0 03/24/2013   HCT 45.7 03/24/2013   PLT 133.0* 03/24/2013   CHOL 191 03/24/2013   TRIG 182.0* 03/24/2013   HDL 36.90* 03/24/2013   ALT 20 03/24/2013   AST 20 03/24/2013   NA 141 03/24/2013   K 3.9 03/24/2013   CL 105 03/24/2013  CREATININE 1.2 03/24/2013   BUN 13 03/24/2013   CO2 29 03/24/2013   TSH 2.33 03/24/2013   PSA 9.01* 06/21/2011      Assessment & Plan:

## 2014-03-25 NOTE — Assessment & Plan Note (Signed)
Dr Diona Fanti - elevated PSA (7.0 in 10/15)

## 2014-03-25 NOTE — Assessment & Plan Note (Addendum)
Here for medicare wellness/physical  Diet: heart healthy  Physical activity: not sedentary - car rallys Depression/mood screen: negative  Hearing: intact to whispered voice  Visual acuity: grossly normal, performs annual eye exam  ADLs: capable  Fall risk: none  Home safety: good  Cognitive evaluation: intact to orientation, naming, recall and repetition  EOL planning: adv directives, full code/ I agree  I have personally reviewed and have noted  1. The patient's medical and social history  2. Their use of alcohol, tobacco or illicit drugs  3. Their current medications and supplements  4. The patient's functional ability including ADL's, fall risks, home safety risks and hearing or visual impairment.  5. Diet and physical activities  6. Evidence for depression or mood disorders    Today patient counseled on age appropriate routine health concerns for screening and prevention, each reviewed and up to date or declined. Immunizations reviewed and up to date or declined. Labs ordered and reviewed. Risk factors for depression reviewed and negative. Hearing function and visual acuity are intact. ADLs screened and addressed as needed. Functional ability and level of safety reviewed and appropriate. Education, counseling and referrals performed based on assessed risks today. Patient provided with a copy of personalized plan for preventive services.   Colon - up to date

## 2014-03-25 NOTE — Assessment & Plan Note (Signed)
L sided MSK x 7-8 weeks See Rx - aleve helps Stretch

## 2014-03-25 NOTE — Progress Notes (Signed)
Pre visit review using our clinic review tool, if applicable. No additional management support is needed unless otherwise documented below in the visit note. 

## 2014-03-25 NOTE — Assessment & Plan Note (Signed)
CXR Stretch

## 2014-03-25 NOTE — Assessment & Plan Note (Signed)
Derm consult

## 2014-04-22 DIAGNOSIS — D045 Carcinoma in situ of skin of trunk: Secondary | ICD-10-CM | POA: Diagnosis not present

## 2014-04-22 DIAGNOSIS — L814 Other melanin hyperpigmentation: Secondary | ICD-10-CM | POA: Diagnosis not present

## 2014-04-22 DIAGNOSIS — L718 Other rosacea: Secondary | ICD-10-CM | POA: Diagnosis not present

## 2014-04-22 DIAGNOSIS — L218 Other seborrheic dermatitis: Secondary | ICD-10-CM | POA: Diagnosis not present

## 2014-04-22 DIAGNOSIS — B353 Tinea pedis: Secondary | ICD-10-CM | POA: Diagnosis not present

## 2014-04-22 DIAGNOSIS — L918 Other hypertrophic disorders of the skin: Secondary | ICD-10-CM | POA: Diagnosis not present

## 2014-04-22 DIAGNOSIS — D485 Neoplasm of uncertain behavior of skin: Secondary | ICD-10-CM | POA: Diagnosis not present

## 2014-04-22 DIAGNOSIS — D1801 Hemangioma of skin and subcutaneous tissue: Secondary | ICD-10-CM | POA: Diagnosis not present

## 2014-06-22 DIAGNOSIS — H35372 Puckering of macula, left eye: Secondary | ICD-10-CM | POA: Diagnosis not present

## 2014-06-22 DIAGNOSIS — H43813 Vitreous degeneration, bilateral: Secondary | ICD-10-CM | POA: Diagnosis not present

## 2014-06-26 ENCOUNTER — Telehealth: Payer: Self-pay | Admitting: *Deleted

## 2014-06-26 NOTE — Telephone Encounter (Signed)
Zolpidem 10mg  PA approved until 06/25/15. Pharmacy informed.

## 2014-09-29 ENCOUNTER — Other Ambulatory Visit: Payer: Self-pay | Admitting: Internal Medicine

## 2014-10-01 NOTE — Telephone Encounter (Signed)
ambien rx called in to pharm

## 2014-11-24 DIAGNOSIS — D1801 Hemangioma of skin and subcutaneous tissue: Secondary | ICD-10-CM | POA: Diagnosis not present

## 2014-11-24 DIAGNOSIS — Z85828 Personal history of other malignant neoplasm of skin: Secondary | ICD-10-CM | POA: Diagnosis not present

## 2014-12-12 DIAGNOSIS — Z23 Encounter for immunization: Secondary | ICD-10-CM | POA: Diagnosis not present

## 2014-12-28 ENCOUNTER — Other Ambulatory Visit: Payer: Self-pay | Admitting: Internal Medicine

## 2014-12-28 DIAGNOSIS — R972 Elevated prostate specific antigen [PSA]: Secondary | ICD-10-CM | POA: Diagnosis not present

## 2014-12-28 DIAGNOSIS — N5201 Erectile dysfunction due to arterial insufficiency: Secondary | ICD-10-CM | POA: Diagnosis not present

## 2014-12-28 DIAGNOSIS — N400 Enlarged prostate without lower urinary tract symptoms: Secondary | ICD-10-CM | POA: Diagnosis not present

## 2015-04-19 ENCOUNTER — Encounter: Payer: Self-pay | Admitting: Gastroenterology

## 2015-06-22 ENCOUNTER — Ambulatory Visit (INDEPENDENT_AMBULATORY_CARE_PROVIDER_SITE_OTHER)
Admission: RE | Admit: 2015-06-22 | Discharge: 2015-06-22 | Disposition: A | Discharge status: Home / Self Care | Payer: Medicare Other | Source: Ambulatory Visit | Attending: Internal Medicine | Admitting: Internal Medicine

## 2015-06-22 ENCOUNTER — Encounter: Payer: Self-pay | Admitting: Internal Medicine

## 2015-06-22 ENCOUNTER — Other Ambulatory Visit (INDEPENDENT_AMBULATORY_CARE_PROVIDER_SITE_OTHER): Payer: Medicare Other

## 2015-06-22 ENCOUNTER — Ambulatory Visit (INDEPENDENT_AMBULATORY_CARE_PROVIDER_SITE_OTHER): Payer: Medicare Other | Admitting: Internal Medicine

## 2015-06-22 VITALS — BP 130/84 | HR 78 | Wt 193.0 lb

## 2015-06-22 DIAGNOSIS — Z23 Encounter for immunization: Secondary | ICD-10-CM

## 2015-06-22 DIAGNOSIS — Z0000 Encounter for general adult medical examination without abnormal findings: Secondary | ICD-10-CM

## 2015-06-22 DIAGNOSIS — Z0001 Encounter for general adult medical examination with abnormal findings: Secondary | ICD-10-CM

## 2015-06-22 DIAGNOSIS — R202 Paresthesia of skin: Secondary | ICD-10-CM

## 2015-06-22 DIAGNOSIS — N400 Enlarged prostate without lower urinary tract symptoms: Secondary | ICD-10-CM

## 2015-06-22 DIAGNOSIS — E785 Hyperlipidemia, unspecified: Secondary | ICD-10-CM

## 2015-06-22 DIAGNOSIS — R0600 Dyspnea, unspecified: Secondary | ICD-10-CM | POA: Diagnosis not present

## 2015-06-22 DIAGNOSIS — R0609 Other forms of dyspnea: Secondary | ICD-10-CM

## 2015-06-22 DIAGNOSIS — R0789 Other chest pain: Secondary | ICD-10-CM

## 2015-06-22 LAB — URINALYSIS
BILIRUBIN URINE: NEGATIVE
KETONES UR: NEGATIVE
LEUKOCYTES UA: NEGATIVE
Nitrite: NEGATIVE
SPECIFIC GRAVITY, URINE: 1.01 (ref 1.000–1.030)
URINE GLUCOSE: NEGATIVE
UROBILINOGEN UA: 0.2 (ref 0.0–1.0)
pH: 5.5 (ref 5.0–8.0)

## 2015-06-22 LAB — CBC WITH DIFFERENTIAL/PLATELET
BASOS PCT: 0.4 % (ref 0.0–3.0)
Basophils Absolute: 0 10*3/uL (ref 0.0–0.1)
EOS PCT: 1.9 % (ref 0.0–5.0)
Eosinophils Absolute: 0.1 10*3/uL (ref 0.0–0.7)
HEMATOCRIT: 44.2 % (ref 39.0–52.0)
HEMOGLOBIN: 13.9 g/dL (ref 13.0–17.0)
LYMPHS PCT: 17.9 % (ref 12.0–46.0)
Lymphs Abs: 1 10*3/uL (ref 0.7–4.0)
MCHC: 31.5 g/dL (ref 30.0–36.0)
Monocytes Absolute: 0.6 10*3/uL (ref 0.1–1.0)
Monocytes Relative: 10.7 % (ref 3.0–12.0)
Neutro Abs: 3.7 10*3/uL (ref 1.4–7.7)
Neutrophils Relative %: 69.1 % (ref 43.0–77.0)
Platelets: 184 10*3/uL (ref 150.0–400.0)
RDW: 17 % — ABNORMAL HIGH (ref 11.5–15.5)
WBC: 5.4 10*3/uL (ref 4.0–10.5)

## 2015-06-22 LAB — BASIC METABOLIC PANEL
BUN: 14 mg/dL (ref 6–23)
CALCIUM: 9.8 mg/dL (ref 8.4–10.5)
CO2: 29 meq/L (ref 19–32)
CREATININE: 1.14 mg/dL (ref 0.40–1.50)
Chloride: 104 mEq/L (ref 96–112)
GFR: 67.15 mL/min (ref >60.00)
GLUCOSE: 98 mg/dL (ref 70–99)
Potassium: 4.3 mEq/L (ref 3.5–5.1)
Sodium: 142 mEq/L (ref 135–145)

## 2015-06-22 LAB — LIPID PANEL
Cholesterol: 192 mg/dL (ref 0–200)
HDL: 32.9 mg/dL — AB (ref >39.00)
LDL Cholesterol: 137 mg/dL — ABNORMAL HIGH (ref 0–99)
NONHDL: 159.58
Total CHOL/HDL Ratio: 6
Triglycerides: 113 mg/dL (ref 0.0–149.0)
VLDL: 22.6 mg/dL (ref 0.0–40.0)

## 2015-06-22 LAB — HEPATIC FUNCTION PANEL
ALBUMIN: 4.7 g/dL (ref 3.5–5.2)
ALK PHOS: 59 U/L (ref 39–117)
ALT: 14 U/L (ref 0–53)
AST: 14 U/L (ref 0–37)
Bilirubin, Direct: 0.3 mg/dL (ref 0.0–0.3)
TOTAL PROTEIN: 7.3 g/dL (ref 6.0–8.3)
Total Bilirubin: 1.4 mg/dL — ABNORMAL HIGH (ref 0.2–1.2)

## 2015-06-22 LAB — TSH: TSH: 2.05 u[IU]/mL (ref 0.35–4.50)

## 2015-06-22 LAB — VITAMIN B12: VITAMIN B 12: 262 pg/mL (ref 211–911)

## 2015-06-22 MED ORDER — LORATADINE 10 MG PO TABS: 10.0000 mg | ORAL_TABLET | Freq: Every day | ORAL | Status: DC

## 2015-06-22 MED ORDER — ASPIRIN EC 81 MG PO TBEC: 162.0000 mg | DELAYED_RELEASE_TABLET | Freq: Every day | ORAL | Status: DC

## 2015-06-22 MED ORDER — VITAMIN B-12 1000 MCG PO TABS: 1000.0000 ug | ORAL_TABLET | Freq: Every day | ORAL | Status: DC

## 2015-06-22 MED ORDER — FLUTICASONE PROPIONATE 50 MCG/ACT NA SUSP: 2.0000 | Freq: Every day | NASAL | Status: DC

## 2015-06-22 MED ORDER — VALSARTAN-HYDROCHLOROTHIAZIDE 320-25 MG PO TABS: 1.0000 | ORAL_TABLET | Freq: Every day | ORAL | Status: DC

## 2015-06-22 MED ORDER — ZOLPIDEM TARTRATE 10 MG PO TABS: 10.0000 mg | ORAL_TABLET | Freq: Every day | ORAL | Status: DC

## 2015-06-22 NOTE — Assessment & Plan Note (Signed)
Here for medicare wellness/physical  Diet: heart healthy  Physical activity: not sedentary  Depression/mood screen: negative  Hearing: intact to whispered voice  Visual acuity: grossly normal, performs annual eye exam  ADLs: capable  Fall risk: low to none  Home safety: good  Cognitive evaluation: intact to orientation, naming, recall and repetition  EOL planning: adv directives, full code/ I agree  I have personally reviewed and have noted  1. The patient's medical, surgical and social history  2. Their use of alcohol, tobacco or illicit drugs  3. Their current medications and supplements  4. The patient's functional ability including ADL's, fall risks, home safety risks and hearing or visual impairment.  5. Diet and physical activities  6. Evidence for depression or mood disorders 7. The roster of all physicians providing medical care to patient - is listed in the Snapshot section of the chart and reviewed today.    Today patient counseled on age appropriate routine health concerns for screening and prevention, each reviewed and up to date or declined. Immunizations reviewed and up to date or declined. Labs ordered and reviewed. Risk factors for depression reviewed and negative. Hearing function and visual acuity are intact. ADLs screened and addressed as needed. Functional ability and level of safety reviewed and appropriate. Education, counseling and referrals performed based on assessed risks today. Patient provided with a copy of personalized plan for preventive services.   Colon 2016

## 2015-06-22 NOTE — Assessment & Plan Note (Signed)
5/17 new x a few months EKG, CXR Stress test

## 2015-06-22 NOTE — Assessment & Plan Note (Signed)
PSA per Dr Diona Fanti

## 2015-06-22 NOTE — Progress Notes (Signed)
Subjective:  Patient ID: Chad Palmer, male    DOB: [birth year]  Age: 70 y.o. MRN: [id number]  CC: No chief complaint on file.   HPI Chad Palmer presents for a well exam C/o cough x 1 mo - started as allergies - 95% better C/o DOE - new C/o fatigue  Outpatient Prescriptions Prior to Visit  Medication Sig Dispense Refill  . fluticasone (FLONASE) 50 MCG/ACT nasal spray Place 2 sprays into both nostrils daily. 48 g 3  . Naproxen Sodium (ALEVE PO) Take by mouth as needed.    . naproxen sodium (ANAPROX) 220 MG tablet Take 1 tablet (220 mg total) by mouth daily as needed. 60 tablet 6  . polyethylene glycol (GLYCOLAX/MIRALAX) powder Take 17 g by mouth daily as needed. For constipation     . triamcinolone cream (KENALOG) 0.5 % Apply 1 application topically 3 (three) times daily. 45 g 1  . valsartan-hydrochlorothiazide (DIOVAN-HCT) 320-25 MG per tablet Take 1 tablet by mouth daily. 90 tablet 3  . zolpidem (AMBIEN) 10 MG tablet TAKE 1 TABLET BY MOUTH EVERY DAY AT BEDTIME 90 tablet 1  . Cholecalciferol 1000 UNITS tablet Take 1,000 Units by mouth daily. Reported on 06/22/2015     No facility-administered medications prior to visit.    ROS Review of Systems  Constitutional: Negative for appetite change, fatigue and unexpected weight change.  HENT: Negative for congestion, nosebleeds, sneezing, sore throat and trouble swallowing.   Eyes: Negative for itching and visual disturbance.  Respiratory: Negative for cough.   Cardiovascular: Negative for chest pain, palpitations and leg swelling.  Gastrointestinal: Negative for nausea, diarrhea, blood in stool and abdominal distention.  Genitourinary: Negative for frequency and hematuria.  Musculoskeletal: Positive for arthralgias. Negative for back pain, joint swelling, gait problem and neck pain.  Skin: Negative for rash.  Neurological: Negative for dizziness, tremors, speech difficulty and weakness.  Psychiatric/Behavioral: Positive for sleep  disturbance. Negative for dysphoric mood and agitation. The patient is not nervous/anxious.     Objective:  BP 130/84 mmHg  Pulse 78  Wt 193 lb (87.544 kg)  SpO2 96%  BP Readings from Last 3 Encounters:  06/22/15 130/84  03/25/14 150/94  01/27/14 107/74    Wt Readings from Last 3 Encounters:  06/22/15 193 lb (87.544 kg)  03/25/14 194 lb (87.998 kg)  01/27/14 192 lb (87.091 kg)    Physical Exam  Constitutional: He is oriented to person, place, and time. He appears well-developed. No distress.  NAD  HENT:  Mouth/Throat: Oropharynx is clear and moist.  Eyes: Conjunctivae are normal. Pupils are equal, round, and reactive to light.  Neck: Normal range of motion. No JVD present. No thyromegaly present.  Cardiovascular: Normal rate, regular rhythm, normal heart sounds and intact distal pulses.  Exam reveals no gallop and no friction rub.   No murmur heard. Pulmonary/Chest: Effort normal and breath sounds normal. No respiratory distress. He has no wheezes. He has no rales. He exhibits no tenderness.  Abdominal: Soft. Bowel sounds are normal. He exhibits no distension and no mass. There is no tenderness. There is no rebound and no guarding.  Musculoskeletal: Normal range of motion. He exhibits no edema or tenderness.  Lymphadenopathy:    He has no cervical adenopathy.  Neurological: He is alert and oriented to person, place, and time. He has normal reflexes. No cranial nerve deficit. He exhibits normal muscle tone. He displays a negative Romberg sign. Coordination and gait normal.  Skin: Skin is warm and dry. No  rash noted.  Psychiatric: He has a normal mood and affect. His behavior is normal. Judgment and thought content normal.  rectal - per Urology  Procedure: EKG Indication: DOE Impression: NSR. No acute changes.  Lab Results  Component Value Date   WBC 5.4 03/25/2014   HGB 13.6 03/25/2014   HCT 42.6 03/25/2014   PLT 135.0* 03/25/2014   GLUCOSE 97 03/25/2014   CHOL 191  03/25/2014   TRIG 103.0 03/25/2014   HDL 44.50 03/25/2014   LDLCALC 126* 03/25/2014   ALT 13 03/25/2014   AST 13 03/25/2014   NA 141 03/25/2014   K 4.3 03/25/2014   CL 105 03/25/2014   CREATININE 1.16 03/25/2014   BUN 15 03/25/2014   CO2 27 03/25/2014   TSH 1.48 03/25/2014   PSA 9.01* 06/21/2011    Dg Chest 2 View  03/25/2014  CLINICAL DATA:  Right posterior chest wall pain. EXAM: CHEST  2 VIEW COMPARISON:  Chest x-ray dated May 12, 2010 and CT scan of the chest of May 24, 2010. FINDINGS: The lungs are adequately inflated. There are several stable calcified nodules measuring up to 3 mm consistent with previous granulomatous infection. There is no alveolar infiltrate. There is no pleural effusion or pneumothorax. The heart and pulmonary vascularity are normal. The mediastinum is normal in width. The observed portions of the ribs are unremarkable. There is mild degenerative disc disease of the thoracic spine. IMPRESSION: There is no evidence of pneumonia nor other active cardiopulmonary disease. There is evidence of previous granulomatous infection. Electronically Signed   By: David  Martinique   On: 03/25/2014 11:20    Assessment & Plan:   There are no diagnoses linked to this encounter. I am having Mr. Hontz maintain his polyethylene glycol powder, Cholecalciferol, naproxen sodium, triamcinolone cream, Naproxen Sodium (ALEVE PO), valsartan-hydrochlorothiazide, fluticasone, and zolpidem.  No orders of the defined types were placed in this encounter.     Follow-up: No Follow-up on file.  Walker Kehr, MD

## 2015-06-22 NOTE — Progress Notes (Signed)
Pre visit review using our clinic review tool, if applicable. No additional management support is needed unless otherwise documented below in the visit note. 

## 2015-06-22 NOTE — Patient Instructions (Signed)

## 2015-06-23 LAB — HEPATITIS C ANTIBODY: HCV Ab: NEGATIVE

## 2015-07-26 ENCOUNTER — Encounter: Payer: Self-pay | Admitting: Internal Medicine

## 2015-08-02 ENCOUNTER — Telehealth (HOSPITAL_COMMUNITY): Payer: Self-pay | Admitting: *Deleted

## 2015-08-02 NOTE — Telephone Encounter (Signed)
Left message on voicemail in reference to upcoming appointment scheduled for 08/04/15. Phone number given for a call back so details instructions can be given. Hubbard Robinson, RN

## 2015-08-04 ENCOUNTER — Ambulatory Visit (HOSPITAL_COMMUNITY): Payer: Medicare Other | Attending: Cardiology

## 2015-08-04 DIAGNOSIS — R0609 Other forms of dyspnea: Secondary | ICD-10-CM | POA: Diagnosis not present

## 2015-08-04 DIAGNOSIS — R0789 Other chest pain: Secondary | ICD-10-CM | POA: Insufficient documentation

## 2015-08-04 DIAGNOSIS — I10 Essential (primary) hypertension: Secondary | ICD-10-CM | POA: Diagnosis not present

## 2015-08-04 LAB — MYOCARDIAL PERFUSION IMAGING
CHL CUP MPHR: 149 {beats}/min
CHL CUP NUCLEAR SSS: 3
CHL CUP RESTING HR STRESS: 81 {beats}/min
CSEPED: 6 min
CSEPEDS: 31 s
CSEPPHR: 139 {beats}/min
LVDIAVOL: 94 mL (ref 62–150)
LVSYSVOL: 38 mL
NUC STRESS TID: 1
Percent HR: 93 %
RATE: 0.33
RPE: 18
SDS: 0
SRS: 3

## 2015-08-04 MED ORDER — TECHNETIUM TC 99M TETROFOSMIN IV KIT
11.0000 | PACK | Freq: Once | INTRAVENOUS | Status: AC | PRN
Start: 2015-08-04 — End: 2015-08-04
  Administered 2015-08-04: 11 via INTRAVENOUS
  Filled 2015-08-04: qty 11

## 2015-08-04 MED ORDER — TECHNETIUM TC 99M TETROFOSMIN IV KIT
30.9000 | PACK | Freq: Once | INTRAVENOUS | Status: AC | PRN
  Administered 2015-08-04: 30.9 via INTRAVENOUS
  Filled 2015-08-04: qty 31

## 2015-11-24 DIAGNOSIS — D1801 Hemangioma of skin and subcutaneous tissue: Secondary | ICD-10-CM | POA: Diagnosis not present

## 2015-11-24 DIAGNOSIS — I788 Other diseases of capillaries: Secondary | ICD-10-CM | POA: Diagnosis not present

## 2015-11-24 DIAGNOSIS — L821 Other seborrheic keratosis: Secondary | ICD-10-CM | POA: Diagnosis not present

## 2015-12-21 ENCOUNTER — Other Ambulatory Visit: Payer: Self-pay | Admitting: Internal Medicine

## 2015-12-22 DIAGNOSIS — Z23 Encounter for immunization: Secondary | ICD-10-CM | POA: Diagnosis not present

## 2015-12-29 DIAGNOSIS — R350 Frequency of micturition: Secondary | ICD-10-CM | POA: Diagnosis not present

## 2015-12-29 DIAGNOSIS — R972 Elevated prostate specific antigen [PSA]: Secondary | ICD-10-CM | POA: Diagnosis not present

## 2015-12-29 DIAGNOSIS — N5201 Erectile dysfunction due to arterial insufficiency: Secondary | ICD-10-CM | POA: Diagnosis not present

## 2015-12-29 DIAGNOSIS — N401 Enlarged prostate with lower urinary tract symptoms: Secondary | ICD-10-CM | POA: Diagnosis not present

## 2015-12-29 MED ORDER — ZOLPIDEM TARTRATE 10 MG PO TABS: 10.0000 mg | ORAL_TABLET | Freq: Every day | ORAL | 0 refills | Status: DC

## 2015-12-29 NOTE — Addendum Note (Signed)
Addended by: Lyman Bishop on: 12/29/2015 09:10 AM   Modules accepted: Orders

## 2015-12-29 NOTE — Telephone Encounter (Signed)
Rx called in to pharmacy. 

## 2016-05-17 ENCOUNTER — Telehealth: Payer: Self-pay | Admitting: *Deleted

## 2016-05-17 NOTE — Telephone Encounter (Signed)
Rec'd fax pt requesting refills on his zolpidem.Marland KitchenJohny Chess

## 2016-05-17 NOTE — Telephone Encounter (Signed)
OK to fill this/these prescription(s) with additional refills x1 Needs to have an OV every 6 months Thank you!

## 2016-05-18 MED ORDER — ZOLPIDEM TARTRATE 10 MG PO TABS: 10.0000 mg | ORAL_TABLET | Freq: Every day | ORAL | 1 refills | Status: DC

## 2016-05-18 NOTE — Telephone Encounter (Signed)
Called refill into [facility] had to leave on pharmacy vm.Marland Kitchen/LMB

## 2016-06-21 ENCOUNTER — Ambulatory Visit (INDEPENDENT_AMBULATORY_CARE_PROVIDER_SITE_OTHER): Payer: Medicare Other | Admitting: Internal Medicine

## 2016-06-21 ENCOUNTER — Encounter: Payer: Self-pay | Admitting: Internal Medicine

## 2016-06-21 ENCOUNTER — Other Ambulatory Visit (INDEPENDENT_AMBULATORY_CARE_PROVIDER_SITE_OTHER): Payer: Medicare Other

## 2016-06-21 VITALS — BP 125/75 | HR 71 | Temp 97.7°F | Ht 68.0 in | Wt 190.0 lb

## 2016-06-21 DIAGNOSIS — D569 Thalassemia, unspecified: Secondary | ICD-10-CM | POA: Diagnosis not present

## 2016-06-21 DIAGNOSIS — Z0000 Encounter for general adult medical examination without abnormal findings: Secondary | ICD-10-CM

## 2016-06-21 DIAGNOSIS — E785 Hyperlipidemia, unspecified: Secondary | ICD-10-CM | POA: Diagnosis not present

## 2016-06-21 DIAGNOSIS — R209 Unspecified disturbances of skin sensation: Secondary | ICD-10-CM

## 2016-06-21 DIAGNOSIS — I781 Nevus, non-neoplastic: Secondary | ICD-10-CM

## 2016-06-21 DIAGNOSIS — D1801 Hemangioma of skin and subcutaneous tissue: Secondary | ICD-10-CM | POA: Diagnosis not present

## 2016-06-21 DIAGNOSIS — N401 Enlarged prostate with lower urinary tract symptoms: Secondary | ICD-10-CM

## 2016-06-21 LAB — HEPATIC FUNCTION PANEL
ALBUMIN: 4.5 g/dL (ref 3.5–5.2)
ALT: 13 U/L (ref 0–53)
AST: 14 U/L (ref 0–37)
Alkaline Phosphatase: 56 U/L (ref 39–117)
BILIRUBIN TOTAL: 1.3 mg/dL — AB (ref 0.2–1.2)
Bilirubin, Direct: 0.2 mg/dL (ref 0.0–0.3)
TOTAL PROTEIN: 7.1 g/dL (ref 6.0–8.3)

## 2016-06-21 LAB — URINALYSIS
Bilirubin Urine: NEGATIVE
HGB URINE DIPSTICK: NEGATIVE
Ketones, ur: NEGATIVE
LEUKOCYTES UA: NEGATIVE
NITRITE: NEGATIVE
Specific Gravity, Urine: 1.02 (ref 1.000–1.030)
Total Protein, Urine: NEGATIVE
UROBILINOGEN UA: 0.2 (ref 0.0–1.0)
Urine Glucose: NEGATIVE
pH: 6 (ref 5.0–8.0)

## 2016-06-21 LAB — CBC WITH DIFFERENTIAL/PLATELET
BASOS ABS: 0.1 10*3/uL (ref 0.0–0.1)
Basophils Relative: 1.2 % (ref 0.0–3.0)
EOS ABS: 0.1 10*3/uL (ref 0.0–0.7)
Eosinophils Relative: 2.1 % (ref 0.0–5.0)
HCT: 43.2 % (ref 39.0–52.0)
Hemoglobin: 13.7 g/dL (ref 13.0–17.0)
Lymphocytes Relative: 20.5 % (ref 12.0–46.0)
Lymphs Abs: 1.2 10*3/uL (ref 0.7–4.0)
MCHC: 31.8 g/dL (ref 30.0–36.0)
MCV: 58.1 fl — ABNORMAL LOW (ref 78.0–100.0)
MONO ABS: 0.5 10*3/uL (ref 0.1–1.0)
Monocytes Relative: 8.2 % (ref 3.0–12.0)
Neutro Abs: 3.8 10*3/uL (ref 1.4–7.7)
Neutrophils Relative %: 68 % (ref 43.0–77.0)
PLATELETS: 136 10*3/uL — AB (ref 150.0–400.0)
RBC: 7.43 Mil/uL — ABNORMAL HIGH (ref 4.22–5.81)
RDW: 17 % — ABNORMAL HIGH (ref 11.5–15.5)
WBC: 5.6 10*3/uL (ref 4.0–10.5)

## 2016-06-21 LAB — LIPID PANEL
CHOLESTEROL: 196 mg/dL (ref 0–200)
HDL: 45.5 mg/dL (ref >39.00)
LDL CALC: 131 mg/dL — AB (ref 0–99)
NonHDL: 150.65
Total CHOL/HDL Ratio: 4
Triglycerides: 98 mg/dL (ref 0.0–149.0)
VLDL: 19.6 mg/dL (ref 0.0–40.0)

## 2016-06-21 LAB — BASIC METABOLIC PANEL
BUN: 21 mg/dL (ref 6–23)
CALCIUM: 9.4 mg/dL (ref 8.4–10.5)
CHLORIDE: 104 meq/L (ref 96–112)
CO2: 32 mEq/L (ref 19–32)
CREATININE: 1.22 mg/dL (ref 0.40–1.50)
GFR: 61.92 mL/min (ref >60.00)
Glucose, Bld: 112 mg/dL — ABNORMAL HIGH (ref 70–99)
Potassium: 4.1 mEq/L (ref 3.5–5.1)
Sodium: 141 mEq/L (ref 135–145)

## 2016-06-21 LAB — VITAMIN B12: VITAMIN B 12: 703 pg/mL (ref 211–911)

## 2016-06-21 LAB — TSH: TSH: 1.64 u[IU]/mL (ref 0.35–4.50)

## 2016-06-21 MED ORDER — LORATADINE 10 MG PO TABS: 10.0000 mg | ORAL_TABLET | Freq: Every day | ORAL | 3 refills | Status: DC

## 2016-06-21 NOTE — Assessment & Plan Note (Signed)
Derm f/u q 12 months

## 2016-06-21 NOTE — Assessment & Plan Note (Signed)
CBC

## 2016-06-21 NOTE — Patient Instructions (Addendum)
C Well w/Chad Palmer in 1 year Health Maintenance, Male A healthy lifestyle and preventive care is important for your health and wellness. Ask your health care provider about what schedule of regular examinations is right for you. What should I know about weight and diet?  Eat a Healthy Diet  Eat plenty of vegetables, fruits, whole grains, low-fat dairy products, and lean protein.  Do not eat a lot of foods high in solid fats, added sugars, or salt. Maintain a Healthy Weight  Regular exercise can help you achieve or maintain a healthy weight. You should:  Do at least 150 minutes of exercise each week. The exercise should increase your heart rate and make you sweat (moderate-intensity exercise).  Do strength-training exercises at least twice a week. Watch Your Levels of Cholesterol and Blood Lipids  Have your blood tested for lipids and cholesterol every 5 years starting at 73 years of age. If you are at high risk for heart disease, you should start having your blood tested when you are 73 years old. You may need to have your cholesterol levels checked more often if:  Your lipid or cholesterol levels are high.  You are older than 73 years of age.  You are at high risk for heart disease. What should I know about cancer screening? Many types of cancers can be detected early and may often be prevented. Lung Cancer  You should be screened every year for lung cancer if:  You are a current smoker who has smoked for at least 30 years.  You are a former smoker who has quit within the past 15 years.  Talk to your health care provider about your screening options, when you should start screening, and how often you should be screened. Colorectal Cancer  Routine colorectal cancer screening usually begins at 73 years of age and should be repeated every 5-10 years until you are 73 years old. You may need to be screened more often if early forms of precancerous polyps or small growths are found. Your  health care provider may recommend screening at an earlier age if you have risk factors for colon cancer.  Your health care provider may recommend using home test kits to check for hidden blood in the stool.  A small camera at the end of a tube can be used to examine your colon (sigmoidoscopy or colonoscopy). This checks for the earliest forms of colorectal cancer. Prostate and Testicular Cancer  Depending on your age and overall health, your health care provider may do certain tests to screen for prostate and testicular cancer.  Talk to your health care provider about any symptoms or concerns you have about testicular or prostate cancer. Skin Cancer  Check your skin from head to toe regularly.  Tell your health care provider about any new moles or changes in moles, especially if:  There is a change in a mole's size, shape, or color.  You have a mole that is larger than a pencil eraser.  Always use sunscreen. Apply sunscreen liberally and repeat throughout the day.  Protect yourself by wearing long sleeves, pants, a wide-brimmed hat, and sunglasses when outside. What should I know about heart disease, diabetes, and high blood pressure?  If you are 65-41 years of age, have your blood pressure checked every 3-5 years. If you are 68 years of age or older, have your blood pressure checked every year. You should have your blood pressure measured twice-once when you are at a hospital or clinic, and once  when you are not at a hospital or clinic. Record the average of the two measurements. To check your blood pressure when you are not at a hospital or clinic, you can use:  An automated blood pressure machine at a pharmacy.  A home blood pressure monitor.  Talk to your health care provider about your target blood pressure.  If you are between 48-33 years old, ask your health care provider if you should take aspirin to prevent heart disease.  Have regular diabetes screenings by checking your  fasting blood sugar level.  If you are at a normal weight and have a low risk for diabetes, have this test once every three years after the age of 63.  If you are overweight and have a high risk for diabetes, consider being tested at a younger age or more often.  A one-time screening for abdominal aortic aneurysm (AAA) by ultrasound is recommended for men aged 81-75 years who are current or former smokers. What should I know about preventing infection? Hepatitis B  If you have a higher risk for hepatitis B, you should be screened for this virus. Talk with your health care provider to find out if you are at risk for hepatitis B infection. Hepatitis C  Blood testing is recommended for:  Everyone born from [birth year] through 1967.  Anyone with known risk factors for hepatitis C. Sexually Transmitted Diseases (STDs)  You should be screened each year for STDs including gonorrhea and chlamydia if:  You are sexually active and are younger than 73 years of age.  You are older than 73 years of age and your health care provider tells you that you are at risk for this type of infection.  Your sexual activity has changed since you were last screened and you are at an increased risk for chlamydia or gonorrhea. Ask your health care provider if you are at risk.  Talk with your health care provider about whether you are at high risk of being infected with HIV. Your health care provider may recommend a prescription medicine to help prevent HIV infection. What else can I do?  Schedule regular health, dental, and eye exams.  Stay current with your vaccines (immunizations).  Do not use any tobacco products, such as cigarettes, chewing tobacco, and e-cigarettes. If you need help quitting, ask your health care provider.  Limit alcohol intake to no more than 2 drinks per day. One drink equals 12 ounces of beer, 5 ounces of wine, or 1 ounces of hard liquor.  Do not use street drugs.  Do not share  needles.  Ask your health care provider for help if you need support or information about quitting drugs.  Tell your health care provider if you often feel depressed.  Tell your health care provider if you have ever been abused or do not feel safe at home. This information is not intended to replace advice given to you by your health care provider. Make sure you discuss any questions you have with your health care provider. Document Released: 07/22/2007 Document Revised: 09/22/2015 Document Reviewed: 10/27/2014 Elsevier Interactive Patient Education  2017 [employer].

## 2016-06-21 NOTE — Assessment & Plan Note (Signed)
Low B12 - labs

## 2016-06-21 NOTE — Assessment & Plan Note (Signed)
PSA w/Dr Diona Fanti

## 2016-06-21 NOTE — Progress Notes (Signed)
Subjective:  Patient ID: Chad Palmer, male    DOB: 07-24-1945  Age: 71 y.o. MRN: [id number]  CC: No chief complaint on file.   HPI MALIKAH LAKEY presents for a [facility] well exam F/u insomnia C/o allergies  Outpatient Medications Prior to Visit  Medication Sig Dispense Refill  . aspirin EC 81 MG tablet Take 2 tablets (162 mg total) by mouth daily. 100 tablet 3  . Cholecalciferol 1000 UNITS tablet Take 1,000 Units by mouth daily. Reported on 06/22/2015    . fluticasone (FLONASE) 50 MCG/ACT nasal spray Place 2 sprays into both nostrils daily. 48 g 3  . loratadine (CLARITIN) 10 MG tablet Take 1 tablet (10 mg total) by mouth daily. 100 tablet 3  . naproxen sodium (ANAPROX) 220 MG tablet Take 1 tablet (220 mg total) by mouth daily as needed. 60 tablet 6  . polyethylene glycol (GLYCOLAX/MIRALAX) powder Take 17 g by mouth daily as needed. For constipation     . triamcinolone cream (KENALOG) 0.5 % Apply 1 application topically 3 (three) times daily. 45 g 1  . valsartan-hydrochlorothiazide (DIOVAN-HCT) 320-25 MG tablet Take 1 tablet by mouth daily. 90 tablet 3  . vitamin B-12 (CYANOCOBALAMIN) 1000 MCG tablet Take 1 tablet (1,000 mcg total) by mouth daily. 100 tablet 3  . zolpidem (AMBIEN) 10 MG tablet Take 1 tablet (10 mg total) by mouth at bedtime. 90 tablet 1   No facility-administered medications prior to visit.     ROS Review of Systems  Constitutional: Negative for appetite change, fatigue and unexpected weight change.  HENT: Negative for congestion, nosebleeds, sneezing, sore throat and trouble swallowing.   Eyes: Negative for itching and visual disturbance.  Respiratory: Negative for cough.   Cardiovascular: Negative for chest pain, palpitations and leg swelling.  Gastrointestinal: Negative for abdominal distention, blood in stool, diarrhea and nausea.  Genitourinary: Negative for frequency and hematuria.  Musculoskeletal: Negative for back pain, gait problem, joint swelling and neck  pain.  Skin: Negative for rash.  Neurological: Negative for dizziness, tremors, speech difficulty and weakness.  Psychiatric/Behavioral: Negative for agitation, dysphoric mood, sleep disturbance and suicidal ideas. The patient is not nervous/anxious.     Objective:  BP 125/75 (BP Location: Left Arm, Patient Position: Sitting, Cuff Size: Normal)   Pulse 71   Temp 97.7 F (36.5 C) (Oral)   Ht 5\' 8"  (1.727 m)   Wt 190 lb (86.2 kg)   SpO2 97%   BMI 28.89 kg/m   BP Readings from Last 3 Encounters:  06/21/16 125/75  06/22/15 130/84  03/25/14 (!) 150/94    Wt Readings from Last 3 Encounters:  06/21/16 190 lb (86.2 kg)  08/04/15 193 lb (87.5 kg)  06/22/15 193 lb (87.5 kg)    Physical Exam  Constitutional: He is oriented to person, place, and time. He appears well-developed and well-nourished. No distress.  NAD  HENT:  Head: Normocephalic and atraumatic.  Right Ear: External ear normal.  Left Ear: External ear normal.  Nose: Nose normal.  Mouth/Throat: Oropharynx is clear and moist. No oropharyngeal exudate.  Eyes: Conjunctivae and EOM are normal. Pupils are equal, round, and reactive to light. Right eye exhibits no discharge. Left eye exhibits no discharge. No scleral icterus.  Neck: Normal range of motion. Neck supple. No JVD present. No tracheal deviation present. No thyromegaly present.  Cardiovascular: Normal rate, regular rhythm, normal heart sounds and intact distal pulses.  Exam reveals no gallop and no friction rub.   No murmur heard. Pulmonary/Chest:  Effort normal and breath sounds normal. No stridor. No respiratory distress. He has no wheezes. He has no rales. He exhibits no tenderness.  Abdominal: Soft. Bowel sounds are normal. He exhibits no distension and no mass. There is no tenderness. There is no rebound and no guarding.  Genitourinary: Rectum normal, prostate normal and penis normal. Rectal exam shows guaiac negative stool. No penile tenderness.  Musculoskeletal:  Normal range of motion. He exhibits no edema or tenderness.  Lymphadenopathy:    He has no cervical adenopathy.  Neurological: He is alert and oriented to person, place, and time. He has normal reflexes. No cranial nerve deficit. He exhibits normal muscle tone. He displays a negative Romberg sign. Coordination and gait normal.  Skin: Skin is warm and dry. No rash noted. He is not diaphoretic. No erythema. No pallor.  Psychiatric: He has a normal mood and affect. His behavior is normal. Judgment and thought content normal.  rectal per Urology [facility] angiomas  Lab Results  Component Value Date   WBC 5.4 06/22/2015   HGB 13.9 06/22/2015   HCT 44.2 06/22/2015   PLT 184.0 06/22/2015   GLUCOSE 98 06/22/2015   CHOL 192 06/22/2015   TRIG 113.0 06/22/2015   HDL 32.90 (L) 06/22/2015   LDLCALC 137 (H) 06/22/2015   ALT 14 06/22/2015   AST 14 06/22/2015   NA 142 06/22/2015   K 4.3 06/22/2015   CL 104 06/22/2015   CREATININE 1.14 06/22/2015   BUN 14 06/22/2015   CO2 29 06/22/2015   TSH 2.05 06/22/2015   PSA 9.01 (H) 06/21/2011    Dg Chest 2 View  Result Date: 06/22/2015 CLINICAL DATA:  Dyspnea on exertion for the past 3 months EXAM: CHEST  2 VIEW COMPARISON:  PA and lateral chest x-ray of March 25, 2014 FINDINGS: The lungs are adequately inflated. There is no focal infiltrate. There is no pleural effusion. There is a stable 3 mm diameter left upper lobe nodule. The heart and pulmonary vascularity are normal. The mediastinum is normal in width. There is tortuosity of the descending thoracic aorta. There is mild multilevel degenerative disc disease of the thoracic spine. IMPRESSION: Chronic bronchitic changes. There is no evidence of pneumonia, CHF, nor other acute cardiopulmonary abnormality. Electronically Signed   By: David  Martinique M.D.   On: 06/22/2015 10:38    Assessment & Plan:   There are no diagnoses linked to this encounter. I am having Mr. Zeien maintain his polyethylene glycol  powder, Cholecalciferol, naproxen sodium, triamcinolone cream, fluticasone, valsartan-hydrochlorothiazide, loratadine, aspirin EC, vitamin B-12, and zolpidem.  No orders of the defined types were placed in this encounter.    Follow-up: No Follow-up on file.  Walker Kehr, MD

## 2016-06-21 NOTE — Assessment & Plan Note (Addendum)
Here for medicare wellness/physical  Diet: heart healthy  Physical activity: not sedentary  Depression/mood screen: negative  Hearing: intact to whispered voice  Visual acuity: grossly normal, performs annual eye exam  ADLs: capable  Fall risk: low to none  Home safety: good  Cognitive evaluation: intact to orientation, naming, recall and repetition  EOL planning: adv directives, full code/ I agree  I have personally reviewed and have noted  1. The patient's medical, surgical and social history  2. Their use of alcohol, tobacco or illicit drugs  3. Their current medications and supplements  4. The patient's functional ability including ADL's, fall risks, home safety risks and hearing or visual impairment.  5. Diet and physical activities  6. Evidence for depression or mood disorders 7. The roster of all physicians providing medical care to patient - is listed in the Snapshot section of the chart and reviewed today.    Today patient counseled on age appropriate routine health concerns for screening and prevention, each reviewed and up to date or declined. Immunizations reviewed and up to date or declined. Labs ordered and reviewed. Risk factors for depression reviewed and negative. Hearing function and visual acuity are intact. ADLs screened and addressed as needed. Functional ability and level of safety reviewed and appropriate. Education, counseling and referrals performed based on assessed risks today. Patient provided with a copy of personalized plan for preventive services.   Colon 2016 Labs Sch eye exam Derm f/u q 12 months

## 2016-08-29 ENCOUNTER — Other Ambulatory Visit: Payer: Self-pay

## 2016-09-01 ENCOUNTER — Other Ambulatory Visit: Payer: Self-pay

## 2016-09-01 MED ORDER — FLUTICASONE PROPIONATE 50 MCG/ACT NA SUSP: 2.0000 | Freq: Every day | NASAL | 3 refills | Status: DC

## 2016-11-23 ENCOUNTER — Telehealth: Payer: Self-pay

## 2016-11-23 ENCOUNTER — Other Ambulatory Visit: Payer: Self-pay | Admitting: Internal Medicine

## 2016-11-23 MED ORDER — ZOLPIDEM TARTRATE 10 MG PO TABS: 10.0000 mg | ORAL_TABLET | Freq: Every day | ORAL | 0 refills | Status: DC

## 2016-11-23 NOTE — Telephone Encounter (Signed)
[facility] rq rf of zolpidem.

## 2016-11-24 NOTE — Telephone Encounter (Signed)
Faxed Zolpidem to [facility]  [phone]

## 2016-11-28 DIAGNOSIS — D225 Melanocytic nevi of trunk: Secondary | ICD-10-CM | POA: Diagnosis not present

## 2016-11-28 DIAGNOSIS — L821 Other seborrheic keratosis: Secondary | ICD-10-CM | POA: Diagnosis not present

## 2016-11-28 DIAGNOSIS — D2262 Melanocytic nevi of left upper limb, including shoulder: Secondary | ICD-10-CM | POA: Diagnosis not present

## 2016-11-28 DIAGNOSIS — L570 Actinic keratosis: Secondary | ICD-10-CM | POA: Diagnosis not present

## 2016-11-28 DIAGNOSIS — Z85828 Personal history of other malignant neoplasm of skin: Secondary | ICD-10-CM | POA: Diagnosis not present

## 2016-11-28 DIAGNOSIS — Z23 Encounter for immunization: Secondary | ICD-10-CM | POA: Diagnosis not present

## 2016-11-28 DIAGNOSIS — D1801 Hemangioma of skin and subcutaneous tissue: Secondary | ICD-10-CM | POA: Diagnosis not present

## 2016-12-04 ENCOUNTER — Other Ambulatory Visit: Payer: Self-pay

## 2016-12-04 MED ORDER — VALSARTAN-HYDROCHLOROTHIAZIDE 320-25 MG PO TABS: 1.0000 | ORAL_TABLET | Freq: Every day | ORAL | 1 refills | Status: DC

## 2016-12-27 DIAGNOSIS — R972 Elevated prostate specific antigen [PSA]: Secondary | ICD-10-CM | POA: Diagnosis not present

## 2017-01-03 DIAGNOSIS — R972 Elevated prostate specific antigen [PSA]: Secondary | ICD-10-CM | POA: Diagnosis not present

## 2017-01-03 DIAGNOSIS — R350 Frequency of micturition: Secondary | ICD-10-CM | POA: Diagnosis not present

## 2017-01-03 DIAGNOSIS — N401 Enlarged prostate with lower urinary tract symptoms: Secondary | ICD-10-CM | POA: Diagnosis not present

## 2017-01-22 ENCOUNTER — Other Ambulatory Visit: Payer: Self-pay | Admitting: Internal Medicine

## 2017-03-19 ENCOUNTER — Other Ambulatory Visit: Payer: Self-pay | Admitting: Internal Medicine

## 2017-03-22 ENCOUNTER — Other Ambulatory Visit: Payer: Self-pay | Admitting: Internal Medicine

## 2017-03-22 NOTE — Telephone Encounter (Signed)
Routing to dr plotnikov, please advise, thanks 

## 2017-06-21 NOTE — Progress Notes (Addendum)
Subjective:   Chad Palmer is a 72 y.o. male who presents for Medicare Annual/Subsequent preventive examination.  Review of Systems:  No ROS.  Medicare Wellness Visit. Additional risk factors are reflected in the social history.  Cardiac Risk Factors include: advanced age (>57men, >37 women);male gender;hypertension Sleep patterns: feels rested on waking, gets up 0 times nightly to void and sleeps 7-8 hours nightly.   Home Safety/Smoke Alarms: Feels safe in home. Smoke alarms in place.  Living environment; residence and Firearm Safety: 2-story house, no firearms. Seat Belt Safety/Bike Helmet: Wears seat belt.     Objective:    Vitals: BP 134/82   Pulse 65   Resp 18   Ht 5\' 8"  (1.727 m)   Wt 191 lb (86.6 kg)   SpO2 99%   BMI 29.04 kg/m   Body mass index is 29.04 kg/m.  Advanced Directives 06/22/2017  Does Patient Have a Medical Advance Directive? Yes  Type of Paramedic of [place];Living will  Copy of [place] in Chart? No - copy requested    Tobacco Social History   Tobacco Use  Smoking Status Never Smoker  Smokeless Tobacco Never Used     Counseling given: Not Answered  Past Medical History:  Diagnosis Date  . Allergic rhinitis   . Arthritis   . BPH (benign prostatic hyperplasia)   . Cataract   . Cervical spinal stenosis    Dr Beacher May  . Colon polyp   . Diverticulosis of colon   . ED (erectile dysfunction)   . Elevated PSA   . Hemorrhoids   . HTN (hypertension)   . Microcytosis    Past Surgical History:  Procedure Laterality Date  . cataract surg     BIL  . HEMORRHOID SURGERY    . TOENAIL EXCISION  2009   ALL  . TONSILLECTOMY    . VASECTOMY    . WISDOM TOOTH EXTRACTION     Family History  Problem Relation Age of Onset  . Alzheimer's disease Mother   . Heart disease Father   . Alzheimer's disease Brother   . Hypertension Other    Social History   Socioeconomic History  . Marital status:  Married    Spouse name: Not on file  . Number of children: 2  . Years of education: Not on file  . Highest education level: Not on file  Occupational History  . Occupation: Neurosurgeon    Comment: Racing cars  Social Needs  . Financial resource strain: Not hard at all  . Food insecurity:    Worry: Never true    Inability: Never true  . Transportation needs:    Medical: No    Non-medical: No  Tobacco Use  . Smoking status: Never Smoker  . Smokeless tobacco: Never Used  Substance and Sexual Activity  . Alcohol use: No    Alcohol/week: 0.0 oz  . Drug use: No  . Sexual activity: Yes  Lifestyle  . Physical activity:    Days per week: 6 days    Minutes per session: 40 min  . Stress: Not at all  Relationships  . Social connections:    Talks on phone: More than three times a week    Gets together: More than three times a week    Attends religious service: More than 4 times per year    Active member of club or organization: Yes    Attends meetings of clubs or organizations: More than 4 times  per year    Relationship status: Married  Other Topics Concern  . Not on file  Social History Narrative       Outpatient Encounter Medications as of 06/22/2017  Medication Sig  . Cholecalciferol 1000 UNITS tablet Take 1,000 Units by mouth daily. Reported on 06/22/2015  . fluticasone (FLONASE) 50 MCG/ACT nasal spray Place 2 sprays into both nostrils daily.  Marland Kitchen loratadine (CLARITIN) 10 MG tablet Take 1 tablet (10 mg total) by mouth daily.  . naproxen sodium (ANAPROX) 220 MG tablet Take 1 tablet (220 mg total) by mouth daily as needed.  . triamcinolone cream (KENALOG) 0.5 % Apply 1 application topically 3 (three) times daily.  . valsartan-hydrochlorothiazide (DIOVAN-HCT) 320-25 MG tablet Take 1 tablet by mouth daily.  . vitamin B-12 (CYANOCOBALAMIN) 1000 MCG tablet Take 1 tablet (1,000 mcg total) by mouth daily.  Marland Kitchen zolpidem (AMBIEN) 10 MG tablet TAKE 1 TABLET BY MOUTH AT BEDTIME  .  [DISCONTINUED] valsartan-hydrochlorothiazide (DIOVAN-HCT) 320-25 MG tablet Take 1 tablet by mouth daily.  . [DISCONTINUED] aspirin EC 81 MG tablet Take 2 tablets (162 mg total) by mouth daily. (Patient not taking: Reported on 06/22/2017)  . [DISCONTINUED] polyethylene glycol (GLYCOLAX/MIRALAX) powder Take 17 g by mouth daily as needed. For constipation    No facility-administered encounter medications on file as of 06/22/2017.     Activities of Daily Living In your present state of health, do you have any difficulty performing the following activities: 06/22/2017  Hearing? N  Vision? N  Difficulty concentrating or making decisions? N  Walking or climbing stairs? N  Dressing or bathing? N  Doing errands, shopping? N  Preparing Food and eating ? N  Using the Toilet? N  In the past six months, have you accidently leaked urine? N  Do you have problems with loss of bowel control? N  Managing your Medications? N  Managing your Finances? N  Housekeeping or managing your Housekeeping? N  Some recent data might be hidden    Patient Care Team: Plotnikov, Evie Lacks, MD as PCP - General Franchot Gallo, MD (Urology) Rutherford Guys, MD (Ophthalmology) Sable Feil, MD as Attending Physician (Gastroenterology)   Assessment:   This is a routine wellness examination for Chad Palmer. Physical assessment deferred to PCP.   Exercise Activities and Dietary recommendations Current Exercise Habits: Home exercise routine, Type of exercise: walking, Time (Minutes): 30, Intensity: Mild, Exercise limited by: None identified  Diet (meal preparation, eat out, water intake, caffeinated beverages, dairy products, fruits and vegetables): in general, a "healthy" diet  , well balanced, eats a variety of fruits and vegetables daily, limits salt, fat/cholesterol, sugar,carbohydrates,caffeine, drinks 6-8 glasses of water daily.  Reviewed heart healthy diet. Diet education was attached to patient's AVS.  Goals      . Patient Stated     Increase my physical activity by stretching after I walk.        Fall Risk Fall Risk  06/22/2017 08/29/2016 06/22/2015 03/25/2014  Falls in the past year? Yes No No No  Comment - Emmi Telephone Survey: data to providers prior to load - -  Number falls in past yr: 1 - - -  Injury with Fall? No - - -  Follow up Falls prevention discussed - - -    Depression Screen PHQ 2/9 Scores 06/22/2017 06/22/2015 03/25/2014  PHQ - 2 Score 0 0 0    Cognitive Function MMSE - Mini Mental State Exam 06/22/2017  Orientation to time 5  Orientation to Place 5  Registration 3  Attention/ Calculation 5  Recall 3  Language- name 2 objects 2  Language- repeat 1  Language- follow 3 step command 3  Language- read & follow direction 1  Write a sentence 1  Copy design 1  Total score 30        Immunization History  Administered Date(s) Administered  . Influenza, High Dose Seasonal PF 12/12/2014  . Influenza,inj,Quad PF,6+ Mos 11/28/2016  . Influenza-Unspecified 11/06/2012, 11/06/2013  . Pneumococcal Conjugate-13 03/24/2013  . Pneumococcal Polysaccharide-23 04/23/2009, 06/22/2015  . Td 05/16/2010  . Zoster 09/06/2007    Screening Tests Health Maintenance  Topic Date Due  . INFLUENZA VACCINE  09/06/2017  . COLONOSCOPY  01/28/2019  . TETANUS/TDAP  05/15/2020  . Hepatitis C Screening  Completed  . PNA vac Low Risk Adult  Completed        Plan:    Continue doing brain stimulating activities (puzzles, reading, adult coloring books, staying active) to keep memory sharp.   Continue to eat heart healthy diet (full of fruits, vegetables, whole grains, lean protein, water--limit salt, fat, and sugar intake) and increase physical activity as tolerated.  I have personally reviewed and noted the following in the patient's chart:   . Medical and social history . Use of alcohol, tobacco or illicit drugs  . Current medications and supplements . Functional ability and  status . Nutritional status . Physical activity . Advanced directives . List of other physicians . Vitals . Screenings to include cognitive, depression, and falls . Referrals and appointments  In addition, I have reviewed and discussed with patient certain preventive protocols, quality metrics, and best practice recommendations. A written personalized care plan for preventive services as well as general preventive health recommendations were provided to patient.     Michiel Cowboy, RN  06/22/2017  Medical screening examination/treatment/procedure(s) were performed by non-physician practitioner and as supervising physician I was immediately available for consultation/collaboration. I agree with above. Lew Dawes, MD

## 2017-06-22 ENCOUNTER — Ambulatory Visit (INDEPENDENT_AMBULATORY_CARE_PROVIDER_SITE_OTHER): Payer: Medicare Other | Admitting: *Deleted

## 2017-06-22 ENCOUNTER — Telehealth: Payer: Self-pay | Admitting: *Deleted

## 2017-06-22 VITALS — BP 134/82 | HR 65 | Resp 18 | Ht 68.0 in | Wt 191.0 lb

## 2017-06-22 DIAGNOSIS — Z0000 Encounter for general adult medical examination without abnormal findings: Secondary | ICD-10-CM | POA: Diagnosis not present

## 2017-06-22 MED ORDER — VALSARTAN-HYDROCHLOROTHIAZIDE 320-25 MG PO TABS: 1.0000 | ORAL_TABLET | Freq: Every day | ORAL | 1 refills | Status: DC

## 2017-06-22 NOTE — Telephone Encounter (Signed)
During AWV, patient stated that he needed a prescription refill for ambien.

## 2017-06-22 NOTE — Patient Instructions (Addendum)
Continue doing brain stimulating activities (puzzles, reading, adult coloring books, staying active) to keep memory sharp.   Continue to eat heart healthy diet (full of fruits, vegetables, whole grains, lean protein, water--limit salt, fat, and sugar intake) and increase physical activity as tolerated.   Chad Palmer , Thank you for taking time to come for your Medicare Wellness Visit. I appreciate your ongoing commitment to your health goals. Please review the following plan we discussed and let me know if I can assist you in the future.   These are the goals we discussed: Goals    . Patient Stated     Increase my physical activity by stretching after I walk.        This is a list of the screening recommended for you and due dates:  Health Maintenance  Topic Date Due  . Flu Shot  09/06/2017  . Colon Cancer Screening  01/28/2019  . Tetanus Vaccine  05/15/2020  .  Hepatitis C: One time screening is recommended by Center for Disease Control  (CDC) for  adults born from [birth year] through 1967.   Completed  . Pneumonia vaccines  Completed    Fat and Cholesterol Restricted Diet Getting too much fat and cholesterol in your diet may cause health problems. Following this diet helps keep your fat and cholesterol at normal levels. This can keep you from getting sick. What types of fat should I choose?  Choose monosaturated and polyunsaturated fats. These are found in foods such as olive oil, canola oil, flaxseeds, walnuts, almonds, and seeds.  Eat more omega-3 fats. Good choices include salmon, mackerel, sardines, tuna, flaxseed oil, and ground flaxseeds.  Limit saturated fats. These are in animal products such as meats, butter, and cream. They can also be in plant products such as palm oil, palm kernel oil, and coconut oil.  Avoid foods with partially hydrogenated oils in them. These contain trans fats. Examples of foods that have trans fats are stick margarine, some tub margarines, cookies,  crackers, and other baked goods. What general guidelines do I need to follow?  Check food labels. Look for the words "trans fat" and "saturated fat."  When preparing a meal: ? Fill half of your plate with vegetables and green salads. ? Fill one fourth of your plate with whole grains. Look for the word "whole" as the first word in the ingredient list. ? Fill one fourth of your plate with lean protein foods.  Eat more foods that have fiber, like apples, carrots, beans, peas, and barley.  Eat more home-cooked foods. Eat less at restaurants and buffets.  Limit or avoid alcohol.  Limit foods high in starch and sugar.  Limit fried foods.  Cook foods without frying them. Baking, boiling, grilling, and broiling are all great options.  Lose weight if you are overweight. Losing even a small amount of weight can help your overall health. It can also help prevent diseases such as diabetes and heart disease. What foods can I eat? Grains Whole grains, such as whole wheat or whole grain breads, crackers, cereals, and pasta. Unsweetened oatmeal, bulgur, barley, quinoa, or brown rice. Corn or whole wheat flour tortillas. Vegetables Fresh or frozen vegetables (raw, steamed, roasted, or grilled). Green salads. Fruits All fresh, canned (in natural juice), or frozen fruits. Meat and Other Protein Products Ground beef (85% or leaner), grass-fed beef, or beef trimmed of fat. Skinless chicken or [place]. Ground chicken or [place]. Pork trimmed of fat. All fish and seafood. Eggs. Dried beans, peas,  or lentils. Unsalted nuts or seeds. Unsalted canned or dry beans. Dairy Low-fat dairy products, such as skim or 1% milk, 2% or reduced-fat cheeses, low-fat ricotta or cottage cheese, or plain low-fat yogurt. Fats and Oils Tub margarines without trans fats. Light or reduced-fat mayonnaise and salad dressings. Avocado. Olive, canola, sesame, or safflower oils. Natural peanut or almond butter (choose ones without  added sugar and oil). The items listed above may not be a complete list of recommended foods or beverages. Contact your dietitian for more options. What foods are not recommended? Grains White bread. White pasta. White rice. Cornbread. Bagels, pastries, and croissants. Crackers that contain trans fat. Vegetables White potatoes. Corn. Creamed or fried vegetables. Vegetables in a cheese sauce. Fruits Dried fruits. Canned fruit in light or heavy syrup. Fruit juice. Meat and Other Protein Products Fatty cuts of meat. Ribs, chicken wings, bacon, sausage, bologna, salami, chitterlings, fatback, hot dogs, bratwurst, and packaged luncheon meats. Liver and organ meats. Dairy Whole or 2% milk, cream, half-and-half, and cream cheese. Whole milk cheeses. Whole-fat or sweetened yogurt. Full-fat cheeses. Nondairy creamers and whipped toppings. Processed cheese, cheese spreads, or cheese curds. Sweets and Desserts Corn syrup, sugars, honey, and molasses. Candy. Jam and jelly. Syrup. Sweetened cereals. Cookies, pies, cakes, donuts, muffins, and ice cream. Fats and Oils Butter, stick margarine, lard, shortening, ghee, or bacon fat. Coconut, palm kernel, or palm oils. Beverages Alcohol. Sweetened drinks (such as sodas, lemonade, and fruit drinks or punches). The items listed above may not be a complete list of foods and beverages to avoid. Contact your dietitian for more information. This information is not intended to replace advice given to you by your health care provider. Make sure you discuss any questions you have with your health care provider. Document Released: 07/25/2011 Document Revised: 09/30/2015 Document Reviewed: 04/24/2013 Elsevier Interactive Patient Education  2018 [place] DASH stands for "Dietary Approaches to Stop Hypertension." The DASH eating plan is a healthy eating plan that has been shown to reduce high blood pressure (hypertension). It may also reduce your  risk for type 2 diabetes, heart disease, and stroke. The DASH eating plan may also help with weight loss. What are tips for following this plan? General guidelines  Avoid eating more than 2,300 mg (milligrams) of salt (sodium) a day. If you have hypertension, you may need to reduce your sodium intake to 1,500 mg a day.  Limit alcohol intake to no more than 1 drink a day for nonpregnant women and 2 drinks a day for men. One drink equals 12 oz of beer, 5 oz of Akya Fiorello, or 1 oz of hard liquor.  Work with your health care provider to maintain a healthy body weight or to lose weight. Ask what an ideal weight is for you.  Get at least 30 minutes of exercise that causes your heart to beat faster (aerobic exercise) most days of the week. Activities may include walking, swimming, or biking.  Work with your health care provider or diet and nutrition specialist (dietitian) to adjust your eating plan to your individual calorie needs. Reading food labels  Check food labels for the amount of sodium per serving. Choose foods with less than 5 percent of the Daily Value of sodium. Generally, foods with less than 300 mg of sodium per serving fit into this eating plan.  To find whole grains, look for the word "whole" as the first word in the ingredient list. Shopping  Buy products labeled as "low-sodium"  or "no salt added."  Buy fresh foods. Avoid canned foods and premade or frozen meals. Cooking  Avoid adding salt when cooking. Use salt-free seasonings or herbs instead of table salt or sea salt. Check with your health care provider or pharmacist before using salt substitutes.  Do not fry foods. Cook foods using healthy methods such as baking, boiling, grilling, and broiling instead.  Cook with heart-healthy oils, such as olive, canola, soybean, or sunflower oil. Meal planning   Eat a balanced diet that includes: ? 5 or more servings of fruits and vegetables each day. At each meal, try to fill half of  your plate with fruits and vegetables. ? Up to 6-8 servings of whole grains each day. ? Less than 6 oz of lean meat, poultry, or fish each day. A 3-oz serving of meat is about the same size as a deck of cards. One egg equals 1 oz. ? 2 servings of low-fat dairy each day. ? A serving of nuts, seeds, or beans 5 times each week. ? Heart-healthy fats. Healthy fats called Omega-3 fatty acids are found in foods such as flaxseeds and coldwater fish, like sardines, salmon, and mackerel.  Limit how much you eat of the following: ? Canned or prepackaged foods. ? Food that is high in trans fat, such as fried foods. ? Food that is high in saturated fat, such as fatty meat. ? Sweets, desserts, sugary drinks, and other foods with added sugar. ? Full-fat dairy products.  Do not salt foods before eating.  Try to eat at least 2 vegetarian meals each week.  Eat more home-cooked food and less restaurant, buffet, and fast food.  When eating at a restaurant, ask that your food be prepared with less salt or no salt, if possible. What foods are recommended? The items listed may not be a complete list. Talk with your dietitian about what dietary choices are best for you. Grains Whole-grain or whole-wheat bread. Whole-grain or whole-wheat pasta. Brown rice. [place] Morrow. Bulgur. Whole-grain and low-sodium cereals. Pita bread. Low-fat, low-sodium crackers. Whole-wheat flour tortillas. Vegetables Fresh or frozen vegetables (raw, steamed, roasted, or grilled). Low-sodium or reduced-sodium tomato and vegetable juice. Low-sodium or reduced-sodium tomato sauce and tomato paste. Low-sodium or reduced-sodium canned vegetables. Fruits All fresh, dried, or frozen fruit. Canned fruit in natural juice (without added sugar). Meat and other protein foods Skinless chicken or [place]. Ground chicken or [place]. Pork with fat trimmed off. Fish and seafood. Egg whites. Dried beans, peas, or lentils. Unsalted nuts, nut butters,  and seeds. Unsalted canned beans. Lean cuts of beef with fat trimmed off. Low-sodium, lean deli meat. Dairy Low-fat (1%) or fat-free (skim) milk. Fat-free, low-fat, or reduced-fat cheeses. Nonfat, low-sodium ricotta or cottage cheese. Low-fat or nonfat yogurt. Low-fat, low-sodium cheese. Fats and oils Soft margarine without trans fats. Vegetable oil. Low-fat, reduced-fat, or light mayonnaise and salad dressings (reduced-sodium). Canola, safflower, olive, soybean, and sunflower oils. Avocado. Seasoning and other foods Herbs. Spices. Seasoning mixes without salt. Unsalted popcorn and pretzels. Fat-free sweets. What foods are not recommended? The items listed may not be a complete list. Talk with your dietitian about what dietary choices are best for you. Grains Baked goods made with fat, such as croissants, muffins, or some breads. Dry pasta or rice meal packs. Vegetables Creamed or fried vegetables. Vegetables in a cheese sauce. Regular canned vegetables (not low-sodium or reduced-sodium). Regular canned tomato sauce and paste (not low-sodium or reduced-sodium). Regular tomato and vegetable juice (not low-sodium or reduced-sodium). Angie Fava.  Olives. Fruits Canned fruit in a light or heavy syrup. Fried fruit. Fruit in cream or butter sauce. Meat and other protein foods Fatty cuts of meat. Ribs. Fried meat. Berniece Salines. Sausage. Bologna and other processed lunch meats. Salami. Fatback. Hotdogs. Bratwurst. Salted nuts and seeds. Canned beans with added salt. Canned or smoked fish. Whole eggs or egg yolks. Chicken or [place] with skin. Dairy Whole or 2% milk, cream, and half-and-half. Whole or full-fat cream cheese. Whole-fat or sweetened yogurt. Full-fat cheese. Nondairy creamers. Whipped toppings. Processed cheese and cheese spreads. Fats and oils Butter. Stick margarine. Lard. Shortening. Ghee. Bacon fat. Tropical oils, such as coconut, palm kernel, or palm oil. Seasoning and other foods Salted popcorn  and pretzels. Onion salt, garlic salt, seasoned salt, table salt, and sea salt. Worcestershire sauce. Tartar sauce. Barbecue sauce. Teriyaki sauce. Soy sauce, including reduced-sodium. Steak sauce. Canned and packaged gravies. Fish sauce. Oyster sauce. Cocktail sauce. Horseradish that you find on the shelf. Ketchup. Mustard. Meat flavorings and tenderizers. Bouillon cubes. Hot sauce and Tabasco sauce. Premade or packaged marinades. Premade or packaged taco seasonings. Relishes. Regular salad dressings. Where to find more information:  National Heart, Lung, and [place]: https://wilson-eaton.com/  American Heart Association: www.heart.org Summary  The DASH eating plan is a healthy eating plan that has been shown to reduce high blood pressure (hypertension). It may also reduce your risk for type 2 diabetes, heart disease, and stroke.  With the DASH eating plan, you should limit salt (sodium) intake to 2,300 mg a day. If you have hypertension, you may need to reduce your sodium intake to 1,500 mg a day.  When on the DASH eating plan, aim to eat more fresh fruits and vegetables, whole grains, lean proteins, low-fat dairy, and heart-healthy fats.  Work with your health care provider or diet and nutrition specialist (dietitian) to adjust your eating plan to your individual calorie needs. This information is not intended to replace advice given to you by your health care provider. Make sure you discuss any questions you have with your health care provider. Document Released: 01/12/2011 Document Revised: 01/17/2016 Document Reviewed: 01/17/2016 Elsevier Interactive Patient Education  2018 [place] Maintenance, Male A healthy lifestyle and preventive care is important for your health and wellness. Ask your health care provider about what schedule of regular examinations is right for you. What should I know about weight and diet? Eat a Healthy Diet  Eat plenty of vegetables, fruits,  whole grains, low-fat dairy products, and lean protein.  Do not eat a lot of foods high in solid fats, added sugars, or salt.  Maintain a Healthy Weight Regular exercise can help you achieve or maintain a healthy weight. You should:  Do at least 150 minutes of exercise each week. The exercise should increase your heart rate and make you sweat (moderate-intensity exercise).  Do strength-training exercises at least twice a week.  Watch Your Levels of Cholesterol and Blood Lipids  Have your blood tested for lipids and cholesterol every 5 years starting at 74 years of age. If you are at high risk for heart disease, you should start having your blood tested when you are 74 years old. You may need to have your cholesterol levels checked more often if: ? Your lipid or cholesterol levels are high. ? You are older than 73 years of age. ? You are at high risk for heart disease.  What should I know about cancer screening? Many types of cancers can be detected early  and may often be prevented. Lung Cancer  You should be screened every year for lung cancer if: ? You are a current smoker who has smoked for at least 30 years. ? You are a former smoker who has quit within the past 15 years.  Talk to your health care provider about your screening options, when you should start screening, and how often you should be screened.  Colorectal Cancer  Routine colorectal cancer screening usually begins at 74 years of age and should be repeated every 5-10 years until you are 74 years old. You may need to be screened more often if early forms of precancerous polyps or small growths are found. Your health care provider may recommend screening at an earlier age if you have risk factors for colon cancer.  Your health care provider may recommend using home test kits to check for hidden blood in the stool.  A small camera at the end of a tube can be used to examine your colon (sigmoidoscopy or colonoscopy). This  checks for the earliest forms of colorectal cancer.  Prostate and Testicular Cancer  Depending on your age and overall health, your health care provider may do certain tests to screen for prostate and testicular cancer.  Talk to your health care provider about any symptoms or concerns you have about testicular or prostate cancer.  Skin Cancer  Check your skin from head to toe regularly.  Tell your health care provider about any new moles or changes in moles, especially if: ? There is a change in a mole's size, shape, or color. ? You have a mole that is larger than a pencil eraser.  Always use sunscreen. Apply sunscreen liberally and repeat throughout the day.  Protect yourself by wearing long sleeves, pants, a wide-brimmed hat, and sunglasses when outside.  What should I know about heart disease, diabetes, and high blood pressure?  If you are 20-29 years of age, have your blood pressure checked every 3-5 years. If you are 78 years of age or older, have your blood pressure checked every year. You should have your blood pressure measured twice-once when you are at a hospital or clinic, and once when you are not at a hospital or clinic. Record the average of the two measurements. To check your blood pressure when you are not at a hospital or clinic, you can use: ? An automated blood pressure machine at a pharmacy. ? A home blood pressure monitor.  Talk to your health care provider about your target blood pressure.  If you are between 69-31 years old, ask your health care provider if you should take aspirin to prevent heart disease.  Have regular diabetes screenings by checking your fasting blood sugar level. ? If you are at a normal weight and have a low risk for diabetes, have this test once every three years after the age of 53. ? If you are overweight and have a high risk for diabetes, consider being tested at a younger age or more often.  A one-time screening for abdominal aortic  aneurysm (AAA) by ultrasound is recommended for men aged 64-75 years who are current or former smokers. What should I know about preventing infection? Hepatitis B If you have a higher risk for hepatitis B, you should be screened for this virus. Talk with your health care provider to find out if you are at risk for hepatitis B infection. Hepatitis C Blood testing is recommended for:  Everyone born from [birth year] through 1967.  Anyone  with known risk factors for hepatitis C.  Sexually Transmitted Diseases (STDs)  You should be screened each year for STDs including gonorrhea and chlamydia if: ? You are sexually active and are younger than 74 years of age. ? You are older than 74 years of age and your health care provider tells you that you are at risk for this type of infection. ? Your sexual activity has changed since you were last screened and you are at an increased risk for chlamydia or gonorrhea. Ask your health care provider if you are at risk.  Talk with your health care provider about whether you are at high risk of being infected with HIV. Your health care provider may recommend a prescription medicine to help prevent HIV infection.  What else can I do?  Schedule regular health, dental, and eye exams.  Stay current with your vaccines (immunizations).  Do not use any tobacco products, such as cigarettes, chewing tobacco, and e-cigarettes. If you need help quitting, ask your health care provider.  Limit alcohol intake to no more than 2 drinks per day. One drink equals 12 ounces of beer, 5 ounces of Brian Kocourek, or 1 ounces of hard liquor.  Do not use street drugs.  Do not share needles.  Ask your health care provider for help if you need support or information about quitting drugs.  Tell your health care provider if you often feel depressed.  Tell your health care provider if you have ever been abused or do not feel safe at home. This information is not intended to replace advice  given to you by your health care provider. Make sure you discuss any questions you have with your health care provider. Document Released: 07/22/2007 Document Revised: 09/22/2015 Document Reviewed: 10/27/2014 Elsevier Interactive Patient Education  [employer].

## 2017-06-25 MED ORDER — ZOLPIDEM TARTRATE 10 MG PO TABS: 10.0000 mg | ORAL_TABLET | Freq: Every day | ORAL | 1 refills | Status: DC

## 2017-06-25 NOTE — Telephone Encounter (Signed)
Done  RTC 6 mo Thx

## 2017-06-27 ENCOUNTER — Ambulatory Visit (INDEPENDENT_AMBULATORY_CARE_PROVIDER_SITE_OTHER): Payer: Medicare Other | Admitting: Internal Medicine

## 2017-06-27 ENCOUNTER — Encounter: Payer: Self-pay | Admitting: Internal Medicine

## 2017-06-27 ENCOUNTER — Other Ambulatory Visit (INDEPENDENT_AMBULATORY_CARE_PROVIDER_SITE_OTHER): Payer: Medicare Other

## 2017-06-27 VITALS — BP 134/82 | HR 64 | Temp 98.0°F | Ht 68.0 in | Wt 189.0 lb

## 2017-06-27 DIAGNOSIS — N320 Bladder-neck obstruction: Secondary | ICD-10-CM

## 2017-06-27 DIAGNOSIS — D569 Thalassemia, unspecified: Secondary | ICD-10-CM | POA: Diagnosis not present

## 2017-06-27 DIAGNOSIS — I10 Essential (primary) hypertension: Secondary | ICD-10-CM

## 2017-06-27 DIAGNOSIS — W57XXXA Bitten or stung by nonvenomous insect and other nonvenomous arthropods, initial encounter: Secondary | ICD-10-CM | POA: Diagnosis not present

## 2017-06-27 DIAGNOSIS — N401 Enlarged prostate with lower urinary tract symptoms: Secondary | ICD-10-CM

## 2017-06-27 LAB — TSH: TSH: 1.89 u[IU]/mL (ref 0.35–4.50)

## 2017-06-27 LAB — URINALYSIS
Bilirubin Urine: NEGATIVE
Hgb urine dipstick: NEGATIVE
Ketones, ur: NEGATIVE
LEUKOCYTES UA: NEGATIVE
NITRITE: NEGATIVE
SPECIFIC GRAVITY, URINE: 1.01 (ref 1.000–1.030)
Total Protein, Urine: NEGATIVE
UROBILINOGEN UA: 0.2 (ref 0.0–1.0)
Urine Glucose: NEGATIVE
pH: 6 (ref 5.0–8.0)

## 2017-06-27 LAB — CBC WITH DIFFERENTIAL/PLATELET
BASOS ABS: 0 10*3/uL (ref 0.0–0.1)
Basophils Relative: 0.8 % (ref 0.0–3.0)
EOS ABS: 0.1 10*3/uL (ref 0.0–0.7)
Eosinophils Relative: 1.4 % (ref 0.0–5.0)
HEMATOCRIT: 43.4 % (ref 39.0–52.0)
HEMOGLOBIN: 13.7 g/dL (ref 13.0–17.0)
Lymphocytes Relative: 19.2 % (ref 12.0–46.0)
Lymphs Abs: 1.3 10*3/uL (ref 0.7–4.0)
MCHC: 31.5 g/dL (ref 30.0–36.0)
MCV: 59.1 fl — ABNORMAL LOW (ref 78.0–100.0)
MONOS PCT: 7.8 % (ref 3.0–12.0)
Monocytes Absolute: 0.5 10*3/uL (ref 0.1–1.0)
Neutro Abs: 4.6 10*3/uL (ref 1.4–7.7)
Neutrophils Relative %: 70.8 % (ref 43.0–77.0)
Platelets: 156 10*3/uL (ref 150.0–400.0)
RDW: 16.3 % — ABNORMAL HIGH (ref 11.5–15.5)
WBC: 6.5 10*3/uL (ref 4.0–10.5)

## 2017-06-27 LAB — LIPID PANEL
CHOL/HDL RATIO: 5
Cholesterol: 201 mg/dL — ABNORMAL HIGH (ref 0–200)
HDL: 44.1 mg/dL (ref >39.00)
LDL CALC: 132 mg/dL — AB (ref 0–99)
NonHDL: 157.08
Triglycerides: 127 mg/dL (ref 0.0–149.0)
VLDL: 25.4 mg/dL (ref 0.0–40.0)

## 2017-06-27 LAB — BASIC METABOLIC PANEL
BUN: 14 mg/dL (ref 6–23)
CALCIUM: 9.6 mg/dL (ref 8.4–10.5)
CO2: 30 meq/L (ref 19–32)
CREATININE: 1.12 mg/dL (ref 0.40–1.50)
Chloride: 103 mEq/L (ref 96–112)
GFR: 68.15 mL/min (ref >60.00)
GLUCOSE: 97 mg/dL (ref 70–99)
Potassium: 4.3 mEq/L (ref 3.5–5.1)
Sodium: 143 mEq/L (ref 135–145)

## 2017-06-27 LAB — PSA: PSA: 10.49 ng/mL — ABNORMAL HIGH (ref 0.10–4.00)

## 2017-06-27 LAB — HEPATIC FUNCTION PANEL
ALBUMIN: 4.5 g/dL (ref 3.5–5.2)
ALK PHOS: 61 U/L (ref 39–117)
ALT: 14 U/L (ref 0–53)
AST: 14 U/L (ref 0–37)
BILIRUBIN TOTAL: 1.2 mg/dL (ref 0.2–1.2)
Bilirubin, Direct: 0.2 mg/dL (ref 0.0–0.3)
Total Protein: 6.9 g/dL (ref 6.0–8.3)

## 2017-06-27 MED ORDER — VALSARTAN-HYDROCHLOROTHIAZIDE 320-25 MG PO TABS: 1.0000 | ORAL_TABLET | Freq: Every day | ORAL | 3 refills | Status: DC

## 2017-06-27 MED ORDER — ZOLPIDEM TARTRATE 10 MG PO TABS: 10.0000 mg | ORAL_TABLET | Freq: Every day | ORAL | 1 refills | Status: DC

## 2017-06-27 NOTE — Progress Notes (Signed)
Subjective:  Patient ID: [name], male    DOB: Jul 06, 1945  Age: 72 y.o. MRN: [id number]  CC: No chief complaint on file.   HPI Chad Palmer for insomnia, HTN C/o a lot of tick bites at the [place] - needs to be checked for Lyme  Outpatient Medications Prior to Visit  Medication Sig Dispense Refill  . Cholecalciferol 1000 UNITS tablet Take 1,000 Units by mouth daily. Reported on 06/22/2015    . fluticasone (FLONASE) 50 MCG/ACT nasal spray Place 2 sprays into both nostrils daily. 48 g 3  . loratadine (CLARITIN) 10 MG tablet Take 1 tablet (10 mg total) by mouth daily. 100 tablet 3  . naproxen sodium (ANAPROX) 220 MG tablet Take 1 tablet (220 mg total) by mouth daily as needed. 60 tablet 6  . triamcinolone cream (KENALOG) 0.5 % Apply 1 application topically 3 (three) times daily. 45 g 1  . valsartan-hydrochlorothiazide (DIOVAN-HCT) 320-25 MG tablet Take 1 tablet by mouth daily. 90 tablet 1  . vitamin B-12 (CYANOCOBALAMIN) 1000 MCG tablet Take 1 tablet (1,000 mcg total) by mouth daily. 100 tablet 3  . zolpidem (AMBIEN) 10 MG tablet Take 1 tablet (10 mg total) by mouth at bedtime. 30 tablet 1   No facility-administered medications prior to visit.     ROS Review of Systems  Constitutional: Negative for appetite change, fatigue and unexpected weight change.  HENT: Negative for congestion, nosebleeds, sneezing, sore throat and trouble swallowing.   Eyes: Negative for itching and visual disturbance.  Respiratory: Negative for cough.   Cardiovascular: Negative for chest pain, palpitations and leg swelling.  Gastrointestinal: Negative for abdominal distention, blood in stool, diarrhea and nausea.  Genitourinary: Negative for frequency and hematuria.  Musculoskeletal: Negative for back pain, gait problem, joint swelling and neck pain.  Skin: Negative for rash.  Neurological: Negative for dizziness, tremors, speech difficulty and weakness.  Psychiatric/Behavioral: Positive  for sleep disturbance. Negative for agitation, dysphoric mood and suicidal ideas. The patient is not nervous/anxious.     Objective:  BP 134/82 (BP Location: Left Arm, Patient Position: Sitting, Cuff Size: Normal)   Pulse 64   Temp 98 F (36.7 C) (Oral)   Ht 5\' 8"  (1.727 m)   Wt 189 lb (85.7 kg)   SpO2 100%   BMI 28.74 kg/m   BP Readings from Last 3 Encounters:  06/27/17 134/82  06/22/17 134/82  06/21/16 125/75    Wt Readings from Last 3 Encounters:  06/27/17 189 lb (85.7 kg)  06/22/17 191 lb (86.6 kg)  06/21/16 190 lb (86.2 kg)    Physical Exam  Constitutional: He is oriented to person, place, and time. He appears well-developed. No distress.  NAD  HENT:  Mouth/Throat: Oropharynx is clear and moist.  Eyes: Pupils are equal, round, and reactive to light. Conjunctivae are normal.  Neck: Normal range of motion. No JVD present. No thyromegaly present.  Cardiovascular: Normal rate, regular rhythm, normal heart sounds and intact distal pulses. Exam reveals no gallop and no friction rub.  No murmur heard. Pulmonary/Chest: Effort normal and breath sounds normal. No respiratory distress. He has no wheezes. He has no rales. He exhibits no tenderness.  Abdominal: Soft. Bowel sounds are normal. He exhibits no distension and no mass. There is no tenderness. There is no rebound and no guarding.  Musculoskeletal: Normal range of motion. He exhibits no edema or tenderness.  Lymphadenopathy:    He has no cervical adenopathy.  Neurological: He is alert and oriented  to person, place, and time. He has normal reflexes. No cranial nerve deficit. He exhibits normal muscle tone. He displays a negative Romberg sign. Coordination and gait normal.  Skin: Skin is warm and dry. No rash noted.  Psychiatric: He has a normal mood and affect. His behavior is normal. Judgment and thought content normal.   Rectal - per Urology Lab Results  Component Value Date   WBC 5.6 06/21/2016   HGB 13.7 06/21/2016    HCT 43.2 06/21/2016   PLT 136.0 (L) 06/21/2016   GLUCOSE 112 (H) 06/21/2016   CHOL 196 06/21/2016   TRIG 98.0 06/21/2016   HDL 45.50 06/21/2016   LDLCALC 131 (H) 06/21/2016   ALT 13 06/21/2016   AST 14 06/21/2016   NA 141 06/21/2016   K 4.1 06/21/2016   CL 104 06/21/2016   CREATININE 1.22 06/21/2016   BUN 21 06/21/2016   CO2 32 06/21/2016   TSH 1.64 06/21/2016   PSA 9.01 (H) 06/21/2011    Dg Chest 2 View  Result Date: 06/22/2015 CLINICAL DATA:  Dyspnea on exertion for the past 3 months EXAM: CHEST  2 VIEW COMPARISON:  PA and lateral chest x-ray of March 25, 2014 FINDINGS: The lungs are adequately inflated. There is no focal infiltrate. There is no pleural effusion. There is a stable 3 mm diameter left upper lobe nodule. The heart and pulmonary vascularity are normal. The mediastinum is normal in width. There is tortuosity of the descending thoracic aorta. There is mild multilevel degenerative disc disease of the thoracic spine. IMPRESSION: Chronic bronchitic changes. There is no evidence of pneumonia, CHF, nor other acute cardiopulmonary abnormality. Electronically Signed   By: David  Martinique M.D.   On: 06/22/2015 10:38    Assessment & Plan:   There are no diagnoses linked to this encounter. I am having [name] maintain his Cholecalciferol, naproxen sodium, triamcinolone cream, vitamin B-12, loratadine, fluticasone, valsartan-hydrochlorothiazide, and zolpidem.  No orders of the defined types were placed in this encounter.    Follow-up: No follow-ups on file.  Walker Kehr, MD

## 2017-06-27 NOTE — Assessment & Plan Note (Signed)
[facility] 

## 2017-06-27 NOTE — Assessment & Plan Note (Signed)
PSA was 9.6 in Nov F/u w/Urol

## 2017-06-27 NOTE — Assessment & Plan Note (Signed)
Labs

## 2017-06-27 NOTE — Assessment & Plan Note (Signed)
CBC

## 2017-06-29 ENCOUNTER — Ambulatory Visit: Payer: Medicare Other | Admitting: Internal Medicine

## 2017-06-29 LAB — LYME DISEASE, WESTERN BLOT
IGG P18 AB.: ABSENT
IGG P30 AB.: ABSENT
IGG P41 AB.: ABSENT
IGG P93 AB.: ABSENT
IGM P41 AB.: ABSENT
IgG P23 Ab.: ABSENT
IgG P45 Ab.: ABSENT
IgG P58 Ab.: ABSENT
IgG P66 Ab.: ABSENT
IgM P23 Ab.: ABSENT
LYME IGG WB: NEGATIVE
LYME IGM WB: NEGATIVE

## 2017-09-14 ENCOUNTER — Other Ambulatory Visit: Payer: Self-pay | Admitting: *Deleted

## 2017-09-14 MED ORDER — FLUTICASONE PROPIONATE 50 MCG/ACT NA SUSP: 2.0000 | Freq: Every day | NASAL | 3 refills | Status: DC

## 2017-12-11 DIAGNOSIS — L570 Actinic keratosis: Secondary | ICD-10-CM | POA: Diagnosis not present

## 2017-12-11 DIAGNOSIS — Z23 Encounter for immunization: Secondary | ICD-10-CM | POA: Diagnosis not present

## 2017-12-11 DIAGNOSIS — I788 Other diseases of capillaries: Secondary | ICD-10-CM | POA: Diagnosis not present

## 2017-12-11 DIAGNOSIS — L821 Other seborrheic keratosis: Secondary | ICD-10-CM | POA: Diagnosis not present

## 2018-01-23 DIAGNOSIS — N401 Enlarged prostate with lower urinary tract symptoms: Secondary | ICD-10-CM | POA: Diagnosis not present

## 2018-01-23 DIAGNOSIS — N5201 Erectile dysfunction due to arterial insufficiency: Secondary | ICD-10-CM | POA: Diagnosis not present

## 2018-01-23 DIAGNOSIS — R972 Elevated prostate specific antigen [PSA]: Secondary | ICD-10-CM | POA: Diagnosis not present

## 2018-05-09 ENCOUNTER — Other Ambulatory Visit: Payer: Self-pay

## 2018-05-09 MED ORDER — ZOLPIDEM TARTRATE 10 MG PO TABS: 10.0000 mg | ORAL_TABLET | Freq: Every day | ORAL | 0 refills | Status: DC

## 2018-07-02 ENCOUNTER — Ambulatory Visit (INDEPENDENT_AMBULATORY_CARE_PROVIDER_SITE_OTHER): Payer: Medicare Other | Admitting: *Deleted

## 2018-07-02 ENCOUNTER — Encounter: Payer: Self-pay | Admitting: Internal Medicine

## 2018-07-02 ENCOUNTER — Ambulatory Visit (INDEPENDENT_AMBULATORY_CARE_PROVIDER_SITE_OTHER): Payer: Medicare Other | Admitting: Internal Medicine

## 2018-07-02 ENCOUNTER — Other Ambulatory Visit (INDEPENDENT_AMBULATORY_CARE_PROVIDER_SITE_OTHER): Payer: Medicare Other

## 2018-07-02 VITALS — BP 126/68 | HR 71 | Temp 98.5°F | Resp 17 | Ht 68.0 in | Wt 195.0 lb

## 2018-07-02 VITALS — BP 126/68 | HR 71 | Temp 98.5°F | Ht 68.0 in | Wt 195.0 lb

## 2018-07-02 DIAGNOSIS — M7522 Bicipital tendinitis, left shoulder: Secondary | ICD-10-CM

## 2018-07-02 DIAGNOSIS — Z0000 Encounter for general adult medical examination without abnormal findings: Secondary | ICD-10-CM

## 2018-07-02 DIAGNOSIS — D569 Thalassemia, unspecified: Secondary | ICD-10-CM

## 2018-07-02 DIAGNOSIS — M7520 Bicipital tendinitis, unspecified shoulder: Secondary | ICD-10-CM | POA: Insufficient documentation

## 2018-07-02 DIAGNOSIS — N401 Enlarged prostate with lower urinary tract symptoms: Secondary | ICD-10-CM

## 2018-07-02 DIAGNOSIS — R1310 Dysphagia, unspecified: Secondary | ICD-10-CM

## 2018-07-02 DIAGNOSIS — I10 Essential (primary) hypertension: Secondary | ICD-10-CM

## 2018-07-02 DIAGNOSIS — E785 Hyperlipidemia, unspecified: Secondary | ICD-10-CM

## 2018-07-02 LAB — CBC WITH DIFFERENTIAL/PLATELET
Basophils Absolute: 0.1 10*3/uL (ref 0.0–0.1)
Basophils Relative: 1 % (ref 0.0–3.0)
Eosinophils Absolute: 0.1 10*3/uL (ref 0.0–0.7)
Eosinophils Relative: 1.8 % (ref 0.0–5.0)
HCT: 42.5 % (ref 39.0–52.0)
Hemoglobin: 13.9 g/dL (ref 13.0–17.0)
Lymphocytes Relative: 19 % (ref 12.0–46.0)
Lymphs Abs: 1.1 10*3/uL (ref 0.7–4.0)
MCHC: 32.6 g/dL (ref 30.0–36.0)
MCV: 59.1 fl — ABNORMAL LOW (ref 78.0–100.0)
Monocytes Absolute: 0.5 10*3/uL (ref 0.1–1.0)
Monocytes Relative: 9.2 % (ref 3.0–12.0)
Neutro Abs: 3.9 10*3/uL (ref 1.4–7.7)
Neutrophils Relative %: 69 % (ref 43.0–77.0)
Platelets: 165 10*3/uL (ref 150.0–400.0)
RBC: 7.2 Mil/uL — ABNORMAL HIGH (ref 4.22–5.81)
RDW: 17.3 % — ABNORMAL HIGH (ref 11.5–15.5)
WBC: 5.7 10*3/uL (ref 4.0–10.5)

## 2018-07-02 LAB — LIPID PANEL
Cholesterol: 220 mg/dL — ABNORMAL HIGH (ref 0–200)
HDL: 43.4 mg/dL (ref >39.00)
LDL Cholesterol: 153 mg/dL — ABNORMAL HIGH (ref 0–99)
NonHDL: 176.35
Total CHOL/HDL Ratio: 5
Triglycerides: 119 mg/dL (ref 0.0–149.0)
VLDL: 23.8 mg/dL (ref 0.0–40.0)

## 2018-07-02 LAB — BASIC METABOLIC PANEL WITH GFR
BUN: 17 mg/dL (ref 6–23)
CO2: 29 meq/L (ref 19–32)
Calcium: 9.5 mg/dL (ref 8.4–10.5)
Chloride: 102 meq/L (ref 96–112)
Creatinine, Ser: 1.25 mg/dL (ref 0.40–1.50)
GFR: 56.33 mL/min — ABNORMAL LOW
Glucose, Bld: 111 mg/dL — ABNORMAL HIGH (ref 70–99)
Potassium: 4 meq/L (ref 3.5–5.1)
Sodium: 140 meq/L (ref 135–145)

## 2018-07-02 LAB — HEPATIC FUNCTION PANEL
ALT: 17 U/L (ref 0–53)
AST: 16 U/L (ref 0–37)
Albumin: 4.5 g/dL (ref 3.5–5.2)
Alkaline Phosphatase: 61 U/L (ref 39–117)
Bilirubin, Direct: 0.2 mg/dL (ref 0.0–0.3)
Total Bilirubin: 1.4 mg/dL — ABNORMAL HIGH (ref 0.2–1.2)
Total Protein: 7.1 g/dL (ref 6.0–8.3)

## 2018-07-02 LAB — URINALYSIS
Bilirubin Urine: NEGATIVE
Hgb urine dipstick: NEGATIVE
Ketones, ur: NEGATIVE
Leukocytes,Ua: NEGATIVE
Nitrite: NEGATIVE
Specific Gravity, Urine: 1.01 (ref 1.000–1.030)
Total Protein, Urine: NEGATIVE
Urine Glucose: NEGATIVE
Urobilinogen, UA: 0.2 (ref 0.0–1.0)
pH: 6.5 (ref 5.0–8.0)

## 2018-07-02 LAB — TSH: TSH: 1.42 u[IU]/mL (ref 0.35–4.50)

## 2018-07-02 MED ORDER — ZOLPIDEM TARTRATE 10 MG PO TABS: 10.0000 mg | ORAL_TABLET | Freq: Every day | ORAL | 1 refills | Status: DC

## 2018-07-02 MED ORDER — FLUTICASONE PROPIONATE 50 MCG/ACT NA SUSP: 2.0000 | Freq: Every day | NASAL | 3 refills | Status: DC

## 2018-07-02 MED ORDER — VALSARTAN-HYDROCHLOROTHIAZIDE 320-25 MG PO TABS: 1.0000 | ORAL_TABLET | Freq: Every day | ORAL | 3 refills | Status: DC

## 2018-07-02 MED ORDER — PANTOPRAZOLE SODIUM 40 MG PO TBEC: 40.0000 mg | DELAYED_RELEASE_TABLET | Freq: Every day | ORAL | 3 refills | Status: DC

## 2018-07-02 MED ORDER — DICLOFENAC SODIUM 1 % TD GEL: 1.0000 "application " | Freq: Four times a day (QID) | TRANSDERMAL | 3 refills | Status: DC

## 2018-07-02 NOTE — Patient Instructions (Signed)
Continue doing brain stimulating activities (puzzles, reading, adult coloring books, staying active) to keep memory sharp.   Continue to eat heart healthy diet (full of fruits, vegetables, whole grains, lean protein, water--limit salt, fat, and sugar intake) and increase physical activity as tolerated.   Chad Palmer , Thank you for taking time to come for your Medicare Wellness Visit. I appreciate your ongoing commitment to your health goals. Please review the following plan we discussed and let me know if I can assist you in the future.   These are the goals we discussed: Goals    . Patient Stated     Increase my physical activity by stretching after I walk.     . Patient Stated     Maintain current health status. Continue to work on my cars.        This is a list of the screening recommended for you and due dates:  Health Maintenance  Topic Date Due  . Flu Shot  09/07/2018  . Colon Cancer Screening  01/28/2019  . Tetanus Vaccine  05/15/2020  .  Hepatitis C: One time screening is recommended by Center for Disease Control  (CDC) for  adults born from [birth year] through 1967.   Completed  . Pneumonia vaccines  Completed    Preventive Care 65 Years and Older, Male Preventive care refers to lifestyle choices and visits with your health care provider that can promote health and wellness. What does preventive care include?   A yearly physical exam. This is also called an annual well check.  Dental exams once or twice a year.  Routine eye exams. Ask your health care provider how often you should have your eyes checked.  Personal lifestyle choices, including: ? Daily care of your teeth and gums. ? Regular physical activity. ? Eating a healthy diet. ? Avoiding tobacco and drug use. ? Limiting alcohol use. ? Practicing safe sex. ? Taking low doses of aspirin every day. ? Taking vitamin and mineral supplements as recommended by your health care provider. What happens during an annual  well check? The services and screenings done by your health care provider during your annual well check will depend on your age, overall health, lifestyle risk factors, and family history of disease. Counseling Your health care provider may ask you questions about your:  Alcohol use.  Tobacco use.  Drug use.  Emotional well-being.  Home and relationship well-being.  Sexual activity.  Eating habits.  History of falls.  Memory and ability to understand (cognition).  Work and work Statistician. Screening You may have the following tests or measurements:  Height, weight, and BMI.  Blood pressure.  Lipid and cholesterol levels. These may be checked every 5 years, or more frequently if you are over 75 years old.  Skin check.  Lung cancer screening. You may have this screening every year starting at age 75 if you have a 30-pack-year history of smoking and currently smoke or have quit within the past 15 years.  Colorectal cancer screening. All adults should have this screening starting at age 75 and continuing until age 67. You will have tests every 1-10 years, depending on your results and the type of screening test. People at increased risk should start screening at an earlier age. Screening tests may include: ? Guaiac-based fecal occult blood testing. ? Fecal immunochemical test (FIT). ? Stool DNA test. ? Virtual colonoscopy. ? Sigmoidoscopy. During this test, a flexible tube with a tiny camera (sigmoidoscope) is used to examine your rectum  and lower colon. The sigmoidoscope is inserted through your anus into your rectum and lower colon. ? Colonoscopy. During this test, a long, thin, flexible tube with a tiny camera (colonoscope) is used to examine your entire colon and rectum.  Prostate cancer screening. Recommendations will vary depending on your family history and other risks.  Hepatitis C blood test.  Hepatitis B blood test.  Sexually transmitted disease (STD)  testing.  Diabetes screening. This is done by checking your blood sugar (glucose) after you have not eaten for a while (fasting). You may have this done every 1-3 years.  Abdominal aortic aneurysm (AAA) screening. You may need this if you are a current or former smoker.  Osteoporosis. You may be screened starting at age 75 if you are at high risk. Talk with your health care provider about your test results, treatment options, and if necessary, the need for more tests. Vaccines Your health care provider may recommend certain vaccines, such as:  Influenza vaccine. This is recommended every year.  Tetanus, diphtheria, and acellular pertussis (Tdap, Td) vaccine. You may need a Td booster every 10 years.  Varicella vaccine. You may need this if you have not been vaccinated.  Zoster vaccine. You may need this after age 75.  Measles, mumps, and rubella (MMR) vaccine. You may need at least one dose of MMR if you were born in 1957 or later. You may also need a second dose.  Pneumococcal 13-valent conjugate (PCV13) vaccine. One dose is recommended after age 75.  Pneumococcal polysaccharide (PPSV23) vaccine. One dose is recommended after age 75.  Meningococcal vaccine. You may need this if you have certain conditions.  Hepatitis A vaccine. You may need this if you have certain conditions or if you travel or work in places where you may be exposed to hepatitis A.  Hepatitis B vaccine. You may need this if you have certain conditions or if you travel or work in places where you may be exposed to hepatitis B.  Haemophilus influenzae type b (Hib) vaccine. You may need this if you have certain risk factors. Talk to your health care provider about which screenings and vaccines you need and how often you need them. This information is not intended to replace advice given to you by your health care provider. Make sure you discuss any questions you have with your health care provider. Document  Released: 02/19/2015 Document Revised: 03/15/2017 Document Reviewed: 11/24/2014 Elsevier Interactive Patient Education  2019 [employer].

## 2018-07-02 NOTE — Assessment & Plan Note (Signed)
Derm f/u q 12 months Colon 2016 PSA w/Dr Dahlstedt q 12 mo [facility] well w/Jill q 12 mo CL stress test 2017 Pt declined Shingrix

## 2018-07-02 NOTE — Assessment & Plan Note (Signed)
Voltaren gel Ice Exercise

## 2018-07-02 NOTE — Assessment & Plan Note (Signed)
Protonix GI ref

## 2018-07-02 NOTE — Patient Instructions (Signed)
Biceps Tendon Tendinitis (Distal)    Distal biceps tendon tendinitis is inflammation of the distal biceps tendon. The distal biceps tendon is a strong cord of tissue that connects the biceps muscle, on the front of the upper arm, to a bone (radius) in the elbow. Distal biceps tendon tendinitis can interfere with the ability to bend the elbow and turn the hand palm-up (supination). This condition is usually caused by overusing the elbow joint and the biceps muscle, and it usually heals within 6 weeks.  Distal biceps tendon tendinitis may include a grade 1 or grade 2 strain of the tendon. A grade 1 strain is mild, and it involves a slight pull of the tendon without any stretching or noticeable tearing of the tendon. There is usually no loss of biceps muscle strength. A grade 2 strain is moderate, and it involves a small tear in the tendon. The tendon is stretched, and biceps muscle strength is usually decreased.  What are the causes?  This condition may be caused by:   A sudden increase in the frequency or intensity of activity that involves the elbow and the biceps muscle.   Overuse of the biceps muscle. This can happen when you do the same movements over and over, such as:  ? Supination.  ? Forceful straightening (hyperextension) of the elbow.  ? Bending of the elbow.   A direct, forceful hit or injury (trauma) to the elbow. This is rare.  What increases the risk?  The following factors may make you more likely to develop this condition:   Playing contact sports.   Playing sports that involve throwing and overhead movements, including racket sports, gymnastics, weight lifting, or bodybuilding.   Doing physical labor.   Having poor strength and flexibility of the arm and shoulder.   Having injured other parts of the elbow.  What are the signs or symptoms?  Symptoms of this condition may include:   Pain and inflammation in the front of the elbow. Pain may get worse during certain movements, such  as:  ? Supination.  ? Bending the elbow.  ? Lifting or carrying objects.  ? Throwing.   A feeling of warmth in the front of the elbow.   A crackling sound (crepitation) when you move or touch the elbow or the upper arm.  In some cases, symptoms may return (recur) after treatment, and they may be long-lasting (chronic).  How is this diagnosed?  This condition is diagnosed based on your symptoms, your medical history, and a physical exam. You may have tests, including X-rays or MRIs. Your health care provider may test your range of motion by having you do arm movements.  How is this treated?  This condition is treated by resting and icing the injured area, and by doing physical therapy exercises. Depending on the severity of your condition, treatment may also include:   Medicines to help relieve pain and inflammation.   Ultrasound therapy. This is the application of sound waves to the injured area.  Follow these instructions at home:  Managing pain, stiffness, and swelling     If directed, put ice on the injured area:  ? Put ice in a plastic bag.  ? Place a towel between your skin and the bag.  ? Leave the ice on for 20 minutes, 2-3 times a day.   Move your fingers often to avoid stiffness and to lessen swelling.   Raise (elevate) the injured area above the level of your heart while you are   sitting or lying down.   If directed, apply heat to the affected area before you exercise. Use the heat source that your health care provider recommends, such as a moist heat pack or a heating pad.  ? Place a towel between your skin and the heat source.  ? Leave the heat on for 20-30 minutes.  ? Remove the heat if your skin turns bright red. This is especially important if you are unable to feel pain, heat, or cold. You may have a greater risk of getting burned.  Activity   Return to your normal activities as told by your health care provider. Ask your health care provider what activities are safe for you.   Do not lift  anything that is heavier than 10 lb (4.5 kg) until your health care provider tells you that it is safe.   Avoid activities that cause pain or make your condition worse.   Do exercises as told by your health care provider.  General instructions   Take over-the-counter and prescription medicines only as told by your health care provider.   Do not drive or operate heavy machinery while taking prescription pain medicines.   Keep all follow-up visits as told by your health care provider. This is important.  How is this prevented?   Warm up and stretch before being active.   Cool down and stretch after being active.   Give your body time to rest between periods of activity.   Make sure to use equipment that fits you.   Be safe and responsible while being active to avoid falls.   Do at least 150 minutes of moderate-intensity exercise each week, such as brisk walking or water aerobics.   Maintain physical fitness, including:  ? Strength.  ? Flexibility.  ? Cardiovascular fitness.  ? Endurance.  Contact a health care provider if:   You have symptoms that get worse or do not get better after 2 weeks of treatment.   You develop new symptoms.  Get help right away if:   You develop severe pain.  This information is not intended to replace advice given to you by your health care provider. Make sure you discuss any questions you have with your health care provider.  Document Released: 01/23/2005 Document Revised: 09/30/2015 Document Reviewed: 01/01/2015  Elsevier Interactive Patient Education  2019 [employer]

## 2018-07-02 NOTE — Progress Notes (Signed)
Subjective:  Patient ID: Chad Palmer, male    DOB: [birth year]  Age: 73 y.o. MRN: [id number]  CC: No chief complaint on file.   HPI Chad Palmer presents for L shoulder pain x 2 mo, sore. No irradiation C/o problems swallowing x 1.5 months. Tablets feel like they get stuck, bread - slow progress.  Outpatient Medications Prior to Visit  Medication Sig Dispense Refill  . Cholecalciferol 1000 UNITS tablet Take 1,000 Units by mouth daily. Reported on 06/22/2015    . fluticasone (FLONASE) 50 MCG/ACT nasal spray Place 2 sprays into both nostrils daily. 48 g 3  . loratadine (CLARITIN) 10 MG tablet Take 1 tablet (10 mg total) by mouth daily. 100 tablet 3  . naproxen sodium (ANAPROX) 220 MG tablet Take 1 tablet (220 mg total) by mouth daily as needed. 60 tablet 6  . triamcinolone cream (KENALOG) 0.5 % Apply 1 application topically 3 (three) times daily. 45 g 1  . valsartan-hydrochlorothiazide (DIOVAN-HCT) 320-25 MG tablet Take 1 tablet by mouth daily. 90 tablet 3  . vitamin B-12 (CYANOCOBALAMIN) 1000 MCG tablet Take 1 tablet (1,000 mcg total) by mouth daily. 100 tablet 3  . zolpidem (AMBIEN) 10 MG tablet Take 1 tablet (10 mg total) by mouth at bedtime. 90 tablet 0   No facility-administered medications prior to visit.     ROS: Review of Systems  Constitutional: Negative for appetite change, fatigue and unexpected weight change.  HENT: Negative for congestion, nosebleeds, sneezing, sore throat and trouble swallowing.   Eyes: Negative for itching and visual disturbance.  Respiratory: Negative for cough.   Cardiovascular: Negative for chest pain, palpitations and leg swelling.  Gastrointestinal: Negative for abdominal distention, blood in stool, diarrhea, nausea and vomiting.  Genitourinary: Negative for frequency and hematuria.  Musculoskeletal: Positive for arthralgias. Negative for back pain, gait problem, joint swelling and neck pain.  Skin: Negative for rash.  Neurological: Negative  for dizziness, tremors, speech difficulty and weakness.  Psychiatric/Behavioral: Negative for agitation, dysphoric mood, sleep disturbance and suicidal ideas. The patient is not nervous/anxious.     Objective:  BP 126/68   Pulse 71   Temp 98.5 F (36.9 C) (Oral)   Ht 5\' 8"  (1.727 m)   Wt 195 lb (88.5 kg)   SpO2 98%   BMI 29.65 kg/m   BP Readings from Last 3 Encounters:  07/02/18 126/68  07/02/18 126/68  06/27/17 134/82    Wt Readings from Last 3 Encounters:  07/02/18 195 lb (88.5 kg)  07/02/18 195 lb (88.5 kg)  06/27/17 189 lb (85.7 kg)    Physical Exam Constitutional:      General: He is not in acute distress.    Appearance: He is well-developed.     Comments: NAD  Eyes:     Conjunctiva/sclera: Conjunctivae normal.     Pupils: Pupils are equal, round, and reactive to light.  Neck:     Musculoskeletal: Normal range of motion.     Thyroid: No thyromegaly.     Vascular: No JVD.  Cardiovascular:     Rate and Rhythm: Normal rate and regular rhythm.     Heart sounds: Normal heart sounds. No murmur. No friction rub. No gallop.   Pulmonary:     Effort: Pulmonary effort is normal. No respiratory distress.     Breath sounds: Normal breath sounds. No wheezing or rales.  Chest:     Chest wall: No tenderness.  Abdominal:     General: Bowel sounds are normal. There is  no distension.     Palpations: Abdomen is soft. There is no mass.     Tenderness: There is no abdominal tenderness. There is no guarding or rebound.  Musculoskeletal: Normal range of motion.        General: No tenderness.  Lymphadenopathy:     Cervical: No cervical adenopathy.  Skin:    General: Skin is warm and dry.     Findings: No rash.  Neurological:     Mental Status: He is alert and oriented to person, place, and time.     Cranial Nerves: No cranial nerve deficit.     Motor: No abnormal muscle tone.     Coordination: Coordination normal.     Gait: Gait normal.     Deep Tendon Reflexes: Reflexes  are normal and symmetric.  Psychiatric:        Behavior: Behavior normal.        Thought Content: Thought content normal.        Judgment: Judgment normal.   Left biceps tendon it is painful to palpation Shoulders with normal range of motion otherwise  Lab Results  Component Value Date   WBC 6.5 06/27/2017   HGB 13.7 06/27/2017   HCT 43.4 06/27/2017   PLT 156.0 06/27/2017   GLUCOSE 97 06/27/2017   CHOL 201 (H) 06/27/2017   TRIG 127.0 06/27/2017   HDL 44.10 06/27/2017   LDLCALC 132 (H) 06/27/2017   ALT 14 06/27/2017   AST 14 06/27/2017   NA 143 06/27/2017   K 4.3 06/27/2017   CL 103 06/27/2017   CREATININE 1.12 06/27/2017   BUN 14 06/27/2017   CO2 30 06/27/2017   TSH 1.89 06/27/2017   PSA 10.49 (H) 06/27/2017    Dg Chest 2 View  Result Date: 06/22/2015 CLINICAL DATA:  Dyspnea on exertion for the past 3 months EXAM: CHEST  2 VIEW COMPARISON:  PA and lateral chest x-ray of March 25, 2014 FINDINGS: The lungs are adequately inflated. There is no focal infiltrate. There is no pleural effusion. There is a stable 3 mm diameter left upper lobe nodule. The heart and pulmonary vascularity are normal. The mediastinum is normal in width. There is tortuosity of the descending thoracic aorta. There is mild multilevel degenerative disc disease of the thoracic spine. IMPRESSION: Chronic bronchitic changes. There is no evidence of pneumonia, CHF, nor other acute cardiopulmonary abnormality. Electronically Signed   By: David  Martinique M.D.   On: 06/22/2015 10:38    Assessment & Plan:   There are no diagnoses linked to this encounter.   No orders of the defined types were placed in this encounter.    Follow-up: No follow-ups on file.  Walker Kehr, MD

## 2018-07-02 NOTE — Progress Notes (Addendum)
Subjective:   Chad Palmer is a 73 y.o. male who presents for Medicare Annual/Subsequent preventive examination.  Review of Systems:  No ROS.  Medicare Wellness Virtual Visit.  Visual/audio telehealth visit, UTA vital signs.   See social history for additional risk factors.   Cardiac Risk Factors include: advanced age (>35men, >8 women);hypertension;male gender Sleep patterns: feels rested on waking, does not get up to void and sleeps 6-7 hours nightly.    Home Safety/Smoke Alarms: Feels safe in home. Smoke alarms in place.  Living environment; residence and Firearm Safety: 1-story house/ trailer Lives with wife, no needs for DME, good support system . Seat Belt Safety/Bike Helmet: Wears seat belt.   PSA-  Lab Results  Component Value Date   PSA 10.49 (H) 06/27/2017   PSA 9.01 (H) 06/21/2011   PSA 6.35 (H) 05/05/2010       Objective:    Vitals: BP 126/68   Pulse 71   Temp 98.5 F (36.9 C)   Resp 17   Ht 5\' 8"  (1.727 m)   Wt 195 lb (88.5 kg)   SpO2 98%   BMI 29.65 kg/m   Body mass index is 29.65 kg/m.  Advanced Directives 07/02/2018 06/22/2017  Does Patient Have a Medical Advance Directive? Yes Yes  Type of Paramedic of [place];Living will [place];Living will  Copy of [place] in Chart? No - copy requested No - copy requested    Tobacco Social History   Tobacco Use  Smoking Status Never Smoker  Smokeless Tobacco Never Used     Counseling given: Not Answered  Past Medical History:  Diagnosis Date  . Allergic rhinitis   . Arthritis   . BPH (benign prostatic hyperplasia)   . Cataract   . Cervical spinal stenosis    Dr Beacher May  . Colon polyp   . Diverticulosis of colon   . ED (erectile dysfunction)   . Elevated PSA   . Hemorrhoids   . HTN (hypertension)   . Microcytosis    Past Surgical History:  Procedure Laterality Date  . cataract surg     BIL  . HEMORRHOID SURGERY    .  TOENAIL EXCISION  2009   ALL  . TONSILLECTOMY    . VASECTOMY    . WISDOM TOOTH EXTRACTION     Family History  Problem Relation Age of Onset  . Alzheimer's disease Mother   . Heart disease Father   . Alzheimer's disease Brother   . Hypertension Other    Social History   Socioeconomic History  . Marital status: Married    Spouse name: Not on file  . Number of children: 2  . Years of education: Not on file  . Highest education level: Not on file  Occupational History  . 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 Financial resource strain: Not hard at all  . Food insecurity:    Worry: Never true    Inability: Never true  . Transportation needs:    Medical: No    Non-medical: No  Tobacco Use  . Smoking status: Never Smoker  . Smokeless tobacco: Never Used  Substance and Sexual Activity  . Alcohol use: No    Alcohol/week: 0.0 standard drinks  . Drug use: No  . Sexual activity: Yes  Lifestyle  . Physical activity:    Days per week: 6 days    Minutes per session: 40 min  .  Stress: Not at all  Relationships  . Social connections:    Talks on phone: More than three times a week    Gets together: More than three times a week    Attends religious service: More than 4 times per year    Active member of club or organization: Yes    Attends meetings of clubs or organizations: More than 4 times per year    Relationship status: Married  Other Topics Concern  . Not on file  Social History Narrative       Outpatient Encounter Medications as of 07/02/2018  Medication Sig  . Cholecalciferol 1000 UNITS tablet Take 1,000 Units by mouth daily. Reported on 06/22/2015  . loratadine (CLARITIN) 10 MG tablet Take 1 tablet (10 mg total) by mouth daily.  . naproxen sodium (ANAPROX) 220 MG tablet Take 1 tablet (220 mg total) by mouth daily as needed.  . triamcinolone cream (KENALOG) 0.5 % Apply 1 application topically 3 (three) times daily.  . vitamin B-12  (CYANOCOBALAMIN) 1000 MCG tablet Take 1 tablet (1,000 mcg total) by mouth daily.  . [DISCONTINUED] fluticasone (FLONASE) 50 MCG/ACT nasal spray Place 2 sprays into both nostrils daily.  . [DISCONTINUED] valsartan-hydrochlorothiazide (DIOVAN-HCT) 320-25 MG tablet Take 1 tablet by mouth daily.  . [DISCONTINUED] zolpidem (AMBIEN) 10 MG tablet Take 1 tablet (10 mg total) by mouth at bedtime.   No facility-administered encounter medications on file as of 07/02/2018.     Activities of Daily Living In your present state of health, do you have any difficulty performing the following activities: 07/02/2018  Hearing? N  Vision? N  Difficulty concentrating or making decisions? N  Walking or climbing stairs? N  Dressing or bathing? N  Doing errands, shopping? N  Preparing Food and eating ? N  Using the Toilet? N  In the past six months, have you accidently leaked urine? N  Do you have problems with loss of bowel control? N  Managing your Medications? N  Managing your Finances? N  Housekeeping or managing your Housekeeping? N  Some recent data might be hidden    Patient Care Team: Plotnikov, Evie Lacks, MD as PCP - General Franchot Gallo, MD (Urology) Rutherford Guys, MD (Ophthalmology) Sable Feil, MD as Attending Physician (Gastroenterology)   Assessment:   This is a routine wellness examination for Chad Palmer. Physical assessment deferred to PCP.  Exercise Activities and Dietary recommendations Current Exercise Habits: Home exercise routine, Type of exercise: walking, Time (Minutes): 40, Frequency (Times/Week): 5, Weekly Exercise (Minutes/Week): 200, Intensity: Mild, Exercise limited by: orthopedic condition(s)  Diet (meal preparation, eat out, water intake, caffeinated beverages, dairy products, fruits and vegetables): in general, a "healthy" diet  , well balanced . eats a variety of fruits and vegetables daily, limits salt, fat/cholesterol, sugar,carbohydrates,caffeine, drinks 6-8  glasses of water daily.  Goals    . Patient Stated     Increase my physical activity by stretching after I walk.     . Patient Stated     Maintain current health status. Continue to work on my cars.        Fall Risk Fall Risk  07/02/2018 06/22/2017 08/29/2016 06/22/2015 03/25/2014  Falls in the past year? 0 Yes No No No  Comment - - Chad Palmer Telephone Survey: data to providers prior to load - -  Number falls in past yr: 0 1 - - -  Injury with Fall? - No - - -  Follow up - Falls prevention discussed - - -  Depression Screen PHQ 2/9 Scores 07/02/2018 06/22/2017 06/22/2015 03/25/2014  PHQ - 2 Score 0 0 0 0    Cognitive Function MMSE - Mini Mental State Exam 07/02/2018 06/22/2017  Orientation to time 5 5  Orientation to Place 5 5  Registration 3 3  Attention/ Calculation 5 5  Recall 3 3  Language- name 2 objects 2 2  Language- repeat 1 1  Language- follow 3 step command 3 3  Language- read & follow direction 1 1  Write a sentence 1 1  Copy design 1 1  Total score 30 30        Immunization History  Administered Date(s) Administered  . Influenza, High Dose Seasonal PF 12/12/2014, 12/11/2017, 12/11/2017  . Influenza,inj,Quad PF,6+ Mos 11/28/2016  . Influenza-Unspecified 11/06/2012, 11/06/2013  . Pneumococcal Conjugate-13 03/24/2013  . Pneumococcal Polysaccharide-23 04/23/2009, 06/22/2015  . Td 05/16/2010  . Zoster 09/06/2007   Screening Tests Health Maintenance  Topic Date Due  . INFLUENZA VACCINE  09/07/2018  . COLONOSCOPY  01/28/2019  . TETANUS/TDAP  05/15/2020  . Hepatitis C Screening  Completed  . PNA vac Low Risk Adult  Completed       Plan:     Reviewed health maintenance screenings with patient today and relevant education, vaccines, and/or referrals were provided.   Continue to eat heart healthy diet (full of fruits, vegetables, whole grains, lean protein, water--limit salt, fat, and sugar intake) and increase physical activity as tolerated.  Continue doing  brain stimulating activities (puzzles, reading, adult coloring books, staying active) to keep memory sharp.   I have personally reviewed and noted the following in the patient's chart:   . Medical and social history . Use of alcohol, tobacco or illicit drugs  . Current medications and supplements . Functional ability and status . Nutritional status . Physical activity . Advanced directives . List of other physicians . Screenings to include cognitive, depression, and falls . Referrals and appointments  In addition, I have reviewed and discussed with patient certain preventive protocols, quality metrics, and best practice recommendations. A written personalized care plan for preventive services as well as general preventive health recommendations were provided to patient.     Michiel Cowboy, RN  07/02/2018  Medical screening examination/treatment/procedure(s) were performed by non-physician practitioner and as supervising physician I was immediately available for consultation/collaboration. I agree with above. Lew Dawes, MD

## 2018-07-02 NOTE — Assessment & Plan Note (Signed)
Dr Diona Fanti and PSA q 12 mo

## 2018-11-01 ENCOUNTER — Encounter: Payer: Self-pay | Admitting: Gastroenterology

## 2018-12-09 ENCOUNTER — Encounter: Payer: Self-pay | Admitting: Gastroenterology

## 2018-12-09 ENCOUNTER — Ambulatory Visit (INDEPENDENT_AMBULATORY_CARE_PROVIDER_SITE_OTHER): Payer: Medicare Other | Admitting: Gastroenterology

## 2018-12-09 ENCOUNTER — Other Ambulatory Visit: Payer: Self-pay

## 2018-12-09 VITALS — BP 118/82 | HR 88 | Temp 98.1°F | Ht 68.0 in | Wt 184.0 lb

## 2018-12-09 DIAGNOSIS — Z86010 Personal history of colonic polyps: Secondary | ICD-10-CM

## 2018-12-09 DIAGNOSIS — R194 Change in bowel habit: Secondary | ICD-10-CM

## 2018-12-09 DIAGNOSIS — R1310 Dysphagia, unspecified: Secondary | ICD-10-CM

## 2018-12-09 NOTE — Patient Instructions (Addendum)
It has been recommended to you by your physician that you have an endoscopy/colonoscopy completed. Per your request, we did not schedule the procedures today. Please contact our office at [phone] should you decide to have the procedure completed. You will be scheduled for a pre-visit and procedure at that time.  A high fiber diet with plenty of fluids (up to 8 glasses of water daily) is suggested to relieve these symptoms.  Citrucel, 1 tablespoon once or twice daily can be used to keep bowels regular if needed.

## 2018-12-09 NOTE — Progress Notes (Signed)
Review of pertinent gastrointestinal problems: 1.  Adenomatous colon polyps.  Colonoscopy December 2015 Dr. Ardis Hughs found diverticulosis, hemorrhoids and a single subcentimeter adenomatous polyp.  Initially recommended for 5-year recall.  More recent guidelines for polyp surveillance change that interval to 7 years (recall 01/2021).  Colonoscopy June 2009 Dr. Sharlett Iles described "multiple polyps", "1 to 2 mm" removed none sent to pathology.  Colonoscopy 2004 Dr. Sharlett Iles no polyps seen.  Colonoscopy 2000 Dr. Sharlett Iles poor prep, 1 polyp removed but not retrieved.      HPI: This is a very pleasant 73 year old man who was referred to me by Plotnikov, Evie Lacks, MD  to evaluate change in bowels, dysphagia.    Chief complaint is change in bowels, dysphagia  I last saw him about 5 years ago at the time of a December 2015 colonoscopy.  Over the past few months he has noticed a change in his bowels.  He has a sensation of incomplete evacuation.  He has had no bleeding.  He does intermittently have left upper quadrant pains that are severe.  He has also over the same time.  Noted dysphagia to solids, this is nonprogressive, it occurs with pills steak toast and lettuce.  He will often gag it out of his throat.  This is a new symptom.  He has lost 10 pounds since August, a lot of this was quite intentional with some changes in his diet.  He has no heartburn.  He is not on any antiacid medicines.   Review of systems: Pertinent positive and negative review of systems were noted in the above HPI section. All other review negative.   Past Medical History:  Diagnosis Date  . Allergic rhinitis   . Arthritis   . BPH (benign prostatic hyperplasia)   . Cataract   . Cervical spinal stenosis    Dr Beacher May  . Colon polyp   . Diverticulosis of colon   . ED (erectile dysfunction)   . Elevated PSA   . Hemorrhoids   . HTN (hypertension)   . Microcytosis     Past Surgical History:  Procedure Laterality  Date  . cataract surg     BIL  . HEMORRHOID SURGERY    . TOENAIL EXCISION  2009   ALL  . TONSILLECTOMY    . VASECTOMY    . WISDOM TOOTH EXTRACTION      Current Outpatient Medications  Medication Sig Dispense Refill  . Cholecalciferol 1000 UNITS tablet Take 1,000 Units by mouth daily. Reported on 06/22/2015    . diclofenac sodium (VOLTAREN) 1 % GEL Apply 1 application topically 4 (four) times daily. 100 g 3  . fluticasone (FLONASE) 50 MCG/ACT nasal spray Place 2 sprays into both nostrils daily. 48 g 3  . loratadine (CLARITIN) 10 MG tablet Take 1 tablet (10 mg total) by mouth daily. 100 tablet 3  . Melatonin 5 MG CAPS Take 5 mg by mouth.    . naproxen sodium (ANAPROX) 220 MG tablet Take 1 tablet (220 mg total) by mouth daily as needed. 60 tablet 6  . triamcinolone cream (KENALOG) 0.5 % Apply 1 application topically 3 (three) times daily. 45 g 1  . valsartan-hydrochlorothiazide (DIOVAN-HCT) 320-25 MG tablet Take 1 tablet by mouth daily. 90 tablet 3  . zinc gluconate 50 MG tablet Take 50 mg by mouth daily.    Marland Kitchen zolpidem (AMBIEN) 10 MG tablet Take 1 tablet (10 mg total) by mouth at bedtime. 90 tablet 1   No current facility-administered medications for this  visit.     Allergies as of 12/09/2018 - Review Complete 12/09/2018  Allergen Reaction Noted  . Levofloxacin  04/19/2007    Family History  Problem Relation Age of Onset  . Alzheimer's disease Mother   . Heart disease Father   . Alzheimer's disease Brother   . Hypertension Other     Social History   Socioeconomic History  . Marital status: Married    Spouse name: Not on file  . Number of children: 2  . Years of education: Not on file  . Highest education level: Not on file  Occupational History  . 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 Financial resource strain: Not hard at all  . Food insecurity    Worry: Never true    Inability: Never true  . Transportation needs    Medical: No     Non-medical: No  Tobacco Use  . Smoking status: Never Smoker  . Smokeless tobacco: Never Used  Substance and Sexual Activity  . Alcohol use: No    Alcohol/week: 0.0 standard drinks  . Drug use: No  . Sexual activity: Yes  Lifestyle  . Physical activity    Days per week: 6 days    Minutes per session: 40 min  . Stress: Not at all  Relationships  . Social connections    Talks on phone: More than three times a week    Gets together: More than three times a week    Attends religious service: More than 4 times per year    Active member of club or organization: Yes    Attends meetings of clubs or organizations: More than 4 times per year    Relationship status: Married  . Intimate partner violence    Fear of current or ex partner: No    Emotionally abused: No    Physically abused: No    Forced sexual activity: No  Other Topics Concern  . Not on file  Social History Narrative        Physical Exam: BP 118/82 (BP Location: Left Arm, Patient Position: Sitting, Cuff Size: Normal)   Pulse 88   Temp 98.1 F (36.7 C) (Oral)   Ht 5\' 8"  (1.727 m)   Wt 184 lb (83.5 kg)   BMI 27.98 kg/m  Constitutional: generally well-appearing Psychiatric: alert and oriented x3 Eyes: extraocular movements intact Mouth: oral pharynx moist, no lesions Neck: supple no lymphadenopathy Cardiovascular: heart regular rate and rhythm Lungs: clear to auscultation bilaterally Abdomen: soft, nontender, nondistended, no obvious ascites, no peritoneal signs, normal bowel sounds Extremities: no lower extremity edema bilaterally Skin: no lesions on visible extremities   Assessment and plan: 73 y.o. male with personal history of adenomatous polyps, change in bowel habits, solid food nonprogressive dysphagia  For his sensation of incomplete evacuation I recommended a trial of fiber supplements.  He has a history of adenomatous colon polyps and we did discuss the fact that guidelines have changed recently  and he is probably safe for 2 more years before he needs surveillance colonoscopy.  Given his change in bowel habits it is probably prudent to do this sooner however.  He has solid food nonprogressive intermittent dysphagia.  I recommended EGD at his soonest convenience.  He has no heartburn.  Weight loss of about 10 pounds over the past 3 to 4 months is likely from significant new dietary changes that he has made.   Please see the "Patient Instructions" section for addition  details about the plan.   Owens Loffler, MD [facility] 12/09/2018, 8:48 AM  Cc: Cassandria Anger, MD

## 2018-12-17 DIAGNOSIS — Z85828 Personal history of other malignant neoplasm of skin: Secondary | ICD-10-CM | POA: Diagnosis not present

## 2018-12-17 DIAGNOSIS — D224 Melanocytic nevi of scalp and neck: Secondary | ICD-10-CM | POA: Diagnosis not present

## 2018-12-17 DIAGNOSIS — D1801 Hemangioma of skin and subcutaneous tissue: Secondary | ICD-10-CM | POA: Diagnosis not present

## 2018-12-25 DIAGNOSIS — Z23 Encounter for immunization: Secondary | ICD-10-CM | POA: Diagnosis not present

## 2019-01-22 ENCOUNTER — Other Ambulatory Visit: Payer: Self-pay

## 2019-01-22 ENCOUNTER — Ambulatory Visit (AMBULATORY_SURGERY_CENTER): Payer: Medicare Other | Admitting: *Deleted

## 2019-01-22 VITALS — Temp 96.6°F | Ht 68.0 in | Wt 180.2 lb

## 2019-01-22 DIAGNOSIS — Z860100 Personal history of colon polyps, unspecified: Secondary | ICD-10-CM

## 2019-01-22 DIAGNOSIS — R194 Change in bowel habit: Secondary | ICD-10-CM

## 2019-01-22 DIAGNOSIS — R1310 Dysphagia, unspecified: Secondary | ICD-10-CM

## 2019-01-22 DIAGNOSIS — Z1159 Encounter for screening for other viral diseases: Secondary | ICD-10-CM

## 2019-01-22 MED ORDER — NA SULFATE-K SULFATE-MG SULF 17.5-3.13-1.6 GM/177ML PO SOLN: 1.0000 | Freq: Once | ORAL | 0 refills | Status: AC

## 2019-01-22 NOTE — Progress Notes (Signed)

## 2019-02-17 ENCOUNTER — Other Ambulatory Visit: Payer: Self-pay | Admitting: Gastroenterology

## 2019-02-17 ENCOUNTER — Ambulatory Visit (INDEPENDENT_AMBULATORY_CARE_PROVIDER_SITE_OTHER): Payer: Medicare Other

## 2019-02-17 DIAGNOSIS — Z1159 Encounter for screening for other viral diseases: Secondary | ICD-10-CM

## 2019-02-18 LAB — SARS CORONAVIRUS 2 (TAT 6-24 HRS): SARS Coronavirus 2: NEGATIVE

## 2019-02-19 ENCOUNTER — Encounter: Payer: Self-pay | Admitting: Gastroenterology

## 2019-02-19 ENCOUNTER — Ambulatory Visit (AMBULATORY_SURGERY_CENTER): Payer: Medicare Other | Admitting: Gastroenterology

## 2019-02-19 ENCOUNTER — Other Ambulatory Visit: Payer: Self-pay

## 2019-02-19 VITALS — BP 112/68 | HR 68 | Temp 97.8°F | Resp 18 | Ht 68.0 in | Wt 180.2 lb

## 2019-02-19 DIAGNOSIS — K222 Esophageal obstruction: Secondary | ICD-10-CM

## 2019-02-19 DIAGNOSIS — R1310 Dysphagia, unspecified: Secondary | ICD-10-CM | POA: Diagnosis not present

## 2019-02-19 DIAGNOSIS — K208 Other esophagitis without bleeding: Secondary | ICD-10-CM | POA: Diagnosis not present

## 2019-02-19 DIAGNOSIS — K648 Other hemorrhoids: Secondary | ICD-10-CM

## 2019-02-19 DIAGNOSIS — R194 Change in bowel habit: Secondary | ICD-10-CM

## 2019-02-19 DIAGNOSIS — K2950 Unspecified chronic gastritis without bleeding: Secondary | ICD-10-CM | POA: Diagnosis not present

## 2019-02-19 DIAGNOSIS — K5730 Diverticulosis of large intestine without perforation or abscess without bleeding: Secondary | ICD-10-CM | POA: Diagnosis not present

## 2019-02-19 DIAGNOSIS — D124 Benign neoplasm of descending colon: Secondary | ICD-10-CM

## 2019-02-19 DIAGNOSIS — Z86010 Personal history of colonic polyps: Secondary | ICD-10-CM

## 2019-02-19 MED ORDER — SODIUM CHLORIDE 0.9 % IV SOLN: 500.0000 mL | Freq: Once | INTRAVENOUS | Status: DC

## 2019-02-19 NOTE — Op Note (Signed)
[facility] Patient Name: Chad Palmer Procedure Date: 02/19/2019 10:26 AM MRN: [id number] Endoscopist: Milus Banister , MD Age: 74 Referring MD:  Date of Birth: [birth year] Gender: Male Account #: [id number] Procedure:                Colonoscopy Indications:              Change in bowel habits, Colonoscopy December 2015                            Dr. Ardis Hughs found diverticulosis, hemorrhoids and a                            single subcentimeter adenomatous polyp. Initially                            recommended for 5-year recall. More recent                            guidelines for polyp surveillance change that                            interval to 7 years (recall 01/2021). Colonoscopy                            June 2009 Dr. Sharlett Iles described "multiple                            polyps", "1 to 2 mm" removed none sent to                            pathology. Colonoscopy 2004 Dr. Sharlett Iles no polyps                            seen. Colonoscopy 2000 Dr. Sharlett Iles poor prep, 1                            polyp removed but not retrieved. Medicines:                Monitored Anesthesia Care Procedure:                Pre-Anesthesia Assessment:                           - Prior to the procedure, a History and Physical                            was performed, and patient medications and                            allergies were reviewed. The patient's tolerance of                            previous anesthesia was also reviewed. The risks  and benefits of the procedure and the sedation                            options and risks were discussed with the patient.                            All questions were answered, and informed consent                            was obtained. Prior Anticoagulants: The patient has                            taken no previous anticoagulant or antiplatelet                            agents. ASA Grade Assessment: II - A  patient with                            mild systemic disease. After reviewing the risks                            and benefits, the patient was deemed in                            satisfactory condition to undergo the procedure.                           After obtaining informed consent, the colonoscope                            was passed under direct vision. Throughout the                            procedure, the patient's blood pressure, pulse, and                            oxygen saturations were monitored continuously. The                            Colonoscope was introduced through the anus and                            advanced to the the cecum, identified by                            appendiceal orifice and ileocecal valve. The                            colonoscopy was performed without difficulty. The                            patient tolerated the procedure well. The quality  of the bowel preparation was good. The ileocecal                            valve, appendiceal orifice, and rectum were                            photographed. Scope In: 10:36:35 AM Scope Out: 10:48:48 AM Scope Withdrawal Time: 0 hours 10 minutes 9 seconds  Total Procedure Duration: 0 hours 12 minutes 13 seconds  Findings:                 A 4 mm polyp was found in the descending colon. The                            polyp was sessile. The polyp was removed with a                            cold snare. Resection and retrieval were complete.                           Multiple small and large-mouthed diverticula were                            found in the left colon.                           External hemorrhoids were found. The hemorrhoids                            were medium-sized.                           The exam was otherwise without abnormality on                            direct and retroflexion views. Complications:            No immediate complications.  Estimated blood loss:                            None. Estimated Blood Loss:     Estimated blood loss: none. Impression:               - One 4 mm polyp in the descending colon, removed                            with a cold snare. Resected and retrieved.                           - Diverticulosis in the left colon.                           - External hemorrhoids.                           - The examination was otherwise normal on direct  and retroflexion views. Recommendation:           - Patient has a contact number available for                            emergencies. The signs and symptoms of potential                            delayed complications were discussed with the                            patient. Return to normal activities tomorrow.                            Written discharge instructions were provided to the                            patient.                           - Resume previous diet.                           - Continue present medications.                           - Await pathology results. Milus Banister, MD 02/19/2019 11:04:26 AM This report has been signed electronically.

## 2019-02-19 NOTE — Patient Instructions (Signed)
YOU HAD AN ENDOSCOPIC PROCEDURE TODAY AT [facility]:   Refer to the procedure report that was given to you for any specific questions about what was found during the examination.  If the procedure report does not answer your questions, please call your gastroenterologist to clarify.  If you requested that your care partner not be given the details of your procedure findings, then the procedure report has been included in a sealed envelope for you to review at your convenience later.  YOU SHOULD EXPECT: Some feelings of bloating in the abdomen. Passage of more gas than usual.  Walking can help get rid of the air that was put into your GI tract during the procedure and reduce the bloating. If you had a lower endoscopy (such as a colonoscopy or flexible sigmoidoscopy) you may notice spotting of blood in your stool or on the toilet paper. If you underwent a bowel prep for your procedure, you may not have a normal bowel movement for a few days.  Please Note:  You might notice some irritation and congestion in your nose or some drainage.  This is from the oxygen used during your procedure.  There is no need for concern and it should clear up in a day or so.  SYMPTOMS TO REPORT IMMEDIATELY:   Following lower endoscopy (colonoscopy or flexible sigmoidoscopy):  Excessive amounts of blood in the stool  Significant tenderness or worsening of abdominal pains  Swelling of the abdomen that is new, acute  Fever of 100F or higher   Following upper endoscopy (EGD)  Vomiting of blood or coffee ground material  New chest pain or pain under the shoulder blades  Painful or persistently difficult swallowing  New shortness of breath  Fever of 100F or higher  Black, tarry-looking stools  For urgent or emergent issues, a gastroenterologist can be reached at any hour by calling [phone].   DIET:  POST DILATION DIET.  PLEASE SEE HANDOUT.  Drink plenty of fluids but you should avoid  alcoholic beverages for 24 hours.  ACTIVITY:  You should plan to take it easy for the rest of today and you should NOT DRIVE or use heavy machinery until tomorrow (because of the sedation medicines used during the test).    FOLLOW UP: Our staff will call the number listed on your records 48-72 hours following your procedure to check on you and address any questions or concerns that you may have regarding the information given to you following your procedure. If we do not reach you, we will leave a message.  We will attempt to reach you two times.  During this call, we will ask if you have developed any symptoms of COVID 19. If you develop any symptoms (ie: fever, flu-like symptoms, shortness of breath, cough etc.) before then, please call [phone].  If you test positive for Covid 19 in the 2 weeks post procedure, please call and report this information to [place].    If any biopsies were taken you will be contacted by phone or by letter within the next 1-3 weeks.  Please call us at [phone] if you have not heard about the biopsies in 3 weeks.    SIGNATURES/CONFIDENTIALITY: You and/or your care partner have signed paperwork which will be entered into your electronic medical record.  These signatures attest to the fact that that the information above on your After Visit Summary has been reviewed and is understood.  Full responsibility of the confidentiality of this discharge  information lies with you and/or your care-partner. 

## 2019-02-19 NOTE — Progress Notes (Signed)
Temp-LC VS-CW  Pt's states no medical or surgical changes since previsit or office visit.  

## 2019-02-19 NOTE — Progress Notes (Signed)
Report to PACU, RN, vss, BBS= Clear.  

## 2019-02-19 NOTE — Op Note (Signed)
[name] Procedure Date: 02/19/2019 10:26 AM MRN: [id number] Endoscopist: Milus Banister , MD Age: 74 Referring MD:  Date of Birth: 1945-03-18 Gender: Male Account #: [id number] Procedure:                Upper GI endoscopy Indications:              Dysphagia Medicines:                Monitored Anesthesia Care Procedure:                Pre-Anesthesia Assessment:                           - Prior to the procedure, a History and Physical                            was performed, and patient medications and                            allergies were reviewed. The patient's tolerance of                            previous anesthesia was also reviewed. The risks                            and benefits of the procedure and the sedation                            options and risks were discussed with the patient.                            All questions were answered, and informed consent                            was obtained. Prior Anticoagulants: The patient has                            taken no previous anticoagulant or antiplatelet                            agents. ASA Grade Assessment: II - A patient with                            mild systemic disease. After reviewing the risks                            and benefits, the patient was deemed in                            satisfactory condition to undergo the procedure.                           After obtaining informed consent, the endoscope was  passed under direct vision. Throughout the                            procedure, the patient's blood pressure, pulse, and                            oxygen saturations were monitored continuously. The                            Endoscope was introduced through the mouth, and                            advanced to the second part of duodenum. The upper                            GI endoscopy was accomplished without  difficulty.                            The patient tolerated the procedure well. Scope In: Scope Out: Findings:                 One benign-appearing, intrinsic mild stenosis was                            found at the gastroesophageal junction (thick                            Schatzk's ring vs. focal peptic stricure). Biopsies                            were taken with a cold forceps for histology. A TTS                            dilator was passed through the scope. Dilation with                            a 16-17-18 mm balloon dilator was performed to 18                            mm.                           The exam was otherwise without abnormality. Complications:            No immediate complications. Estimated blood loss:                            None. Estimated Blood Loss:     Estimated blood loss: none. Impression:               - Benign-appearing esophageal stenosis (thick                            Schatzki's ring vs. focal peptic stricture).  Biopsied. Dilated.                           - The examination was otherwise normal. Recommendation:           - Patient has a contact number available for                            emergencies. The signs and symptoms of potential                            delayed complications were discussed with the                            patient. Return to normal activities tomorrow.                            Written discharge instructions were provided to the                            patient.                           - Resume previous diet.                           - Continue present medications.                           - Await pathology results. Milus Banister, MD 02/19/2019 11:07:39 AM This report has been signed electronically.

## 2019-02-19 NOTE — Progress Notes (Signed)
Called to room to assist during endoscopic procedure.  Patient ID and intended procedure confirmed with present staff. Received instructions for my participation in the procedure from the performing physician.  

## 2019-02-21 ENCOUNTER — Telehealth: Payer: Self-pay | Admitting: *Deleted

## 2019-02-21 NOTE — Telephone Encounter (Signed)
  Follow up Call-  Call back number 02/19/2019  Post procedure Call Back phone  # [phone]  Permission to leave phone message Yes  Some recent data might be hidden     Patient questions:  Do you have a fever, pain , or abdominal swelling? No. Pain Score  0 *  Have you tolerated food without any problems? Yes.    Have you been able to return to your normal activities? Yes.    Do you have any questions about your discharge instructions: Diet   No. Medications  Yes.    Pt was expecting an additional medication ordered.  Follow up visit  No.  Do you have questions or concerns about your Care? No.  Actions: * If pain score is 4 or above: 1. No action needed, pain <4.Have you developed a fever since your procedure? no  2.   Have you had an respiratory symptoms (SOB or cough) since your procedure? no  3.   Have you tested positive for COVID 19 since your procedure no  4.   Have you had any family members/close contacts diagnosed with the COVID 19 since your procedure?  no   If yes to any of these questions please route to Joylene John, RN and Alphonsa Gin, Therapist, sports.

## 2019-02-21 NOTE — Telephone Encounter (Signed)
Follow up call made, Left message.

## 2019-02-24 ENCOUNTER — Other Ambulatory Visit: Payer: Self-pay | Admitting: Internal Medicine

## 2019-02-24 ENCOUNTER — Other Ambulatory Visit: Payer: Self-pay

## 2019-02-24 MED ORDER — OMEPRAZOLE 40 MG PO CPDR: 40.0000 mg | DELAYED_RELEASE_CAPSULE | Freq: Every day | ORAL | 11 refills | Status: DC

## 2019-02-26 DIAGNOSIS — R3915 Urgency of urination: Secondary | ICD-10-CM | POA: Diagnosis not present

## 2019-02-26 DIAGNOSIS — N401 Enlarged prostate with lower urinary tract symptoms: Secondary | ICD-10-CM | POA: Diagnosis not present

## 2019-02-26 DIAGNOSIS — E291 Testicular hypofunction: Secondary | ICD-10-CM | POA: Diagnosis not present

## 2019-02-26 DIAGNOSIS — R972 Elevated prostate specific antigen [PSA]: Secondary | ICD-10-CM | POA: Diagnosis not present

## 2019-02-26 DIAGNOSIS — R7303 Prediabetes: Secondary | ICD-10-CM | POA: Diagnosis not present

## 2019-02-26 DIAGNOSIS — R635 Abnormal weight gain: Secondary | ICD-10-CM | POA: Diagnosis not present

## 2019-02-26 DIAGNOSIS — I10 Essential (primary) hypertension: Secondary | ICD-10-CM | POA: Diagnosis not present

## 2019-02-26 DIAGNOSIS — R5383 Other fatigue: Secondary | ICD-10-CM | POA: Diagnosis not present

## 2019-02-26 DIAGNOSIS — Z125 Encounter for screening for malignant neoplasm of prostate: Secondary | ICD-10-CM | POA: Diagnosis not present

## 2019-03-04 DIAGNOSIS — Z6827 Body mass index (BMI) 27.0-27.9, adult: Secondary | ICD-10-CM | POA: Diagnosis not present

## 2019-03-04 DIAGNOSIS — Z1331 Encounter for screening for depression: Secondary | ICD-10-CM | POA: Diagnosis not present

## 2019-03-04 DIAGNOSIS — Z1339 Encounter for screening examination for other mental health and behavioral disorders: Secondary | ICD-10-CM | POA: Diagnosis not present

## 2019-03-04 DIAGNOSIS — R7303 Prediabetes: Secondary | ICD-10-CM | POA: Diagnosis not present

## 2019-03-04 DIAGNOSIS — E663 Overweight: Secondary | ICD-10-CM | POA: Diagnosis not present

## 2019-03-04 DIAGNOSIS — R6882 Decreased libido: Secondary | ICD-10-CM | POA: Diagnosis not present

## 2019-03-04 DIAGNOSIS — R5383 Other fatigue: Secondary | ICD-10-CM | POA: Diagnosis not present

## 2019-03-04 DIAGNOSIS — E7800 Pure hypercholesterolemia, unspecified: Secondary | ICD-10-CM | POA: Diagnosis not present

## 2019-03-04 DIAGNOSIS — R635 Abnormal weight gain: Secondary | ICD-10-CM | POA: Diagnosis not present

## 2019-03-04 DIAGNOSIS — E291 Testicular hypofunction: Secondary | ICD-10-CM | POA: Diagnosis not present

## 2019-03-06 DIAGNOSIS — Z23 Encounter for immunization: Secondary | ICD-10-CM | POA: Diagnosis not present

## 2019-03-16 ENCOUNTER — Ambulatory Visit: Payer: Medicare Other

## 2019-03-20 DIAGNOSIS — H52203 Unspecified astigmatism, bilateral: Secondary | ICD-10-CM | POA: Diagnosis not present

## 2019-03-20 DIAGNOSIS — Z961 Presence of intraocular lens: Secondary | ICD-10-CM | POA: Diagnosis not present

## 2019-03-20 DIAGNOSIS — H524 Presbyopia: Secondary | ICD-10-CM | POA: Diagnosis not present

## 2019-03-24 ENCOUNTER — Telehealth: Payer: Self-pay | Admitting: Gastroenterology

## 2019-03-24 NOTE — Telephone Encounter (Signed)
Patient called has been experiencing abdominal pain about 20-30 mins right after a meal and taking Omeprazole he is requesting if something else can be given for he stopped the medication 4 days ago.

## 2019-03-24 NOTE — Telephone Encounter (Signed)
The pt was prescribed omeprazole for reflux and states he took it as recommended prior to breakfast and shortly after developed epigastric pain and eventual diarrhea.  He states he changed to lunch and same problem occurred as well as dinner.  He want to know if he needs to try another PPI or try H2 blocker.  Please advise

## 2019-03-25 NOTE — Telephone Encounter (Signed)
Left message on machine to call back  

## 2019-03-25 NOTE — Telephone Encounter (Signed)
Sorry to hear that. Diarrhea and dyspepsia are both potential PPI side effects. I agree that he stop omeprazale.  Instead he should take OTC pepcid (famotidine) 20mg  pills, one pill with BF and one pill at bedtime.    OV with me in 6-7 weeks to see how he is doing.  Thanks

## 2019-03-25 NOTE — Telephone Encounter (Signed)
The patient has been notified of this information and all questions answered. The pt will call back to set up app for follow up

## 2019-04-01 DIAGNOSIS — Z23 Encounter for immunization: Secondary | ICD-10-CM | POA: Diagnosis not present

## 2019-04-03 ENCOUNTER — Ambulatory Visit: Payer: Medicare Other

## 2019-05-23 ENCOUNTER — Other Ambulatory Visit: Payer: Self-pay

## 2019-05-26 ENCOUNTER — Encounter: Payer: Self-pay | Admitting: Gastroenterology

## 2019-05-26 ENCOUNTER — Ambulatory Visit (INDEPENDENT_AMBULATORY_CARE_PROVIDER_SITE_OTHER): Payer: Medicare Other | Admitting: Gastroenterology

## 2019-05-26 VITALS — BP 138/80 | HR 64 | Temp 97.6°F | Ht 68.0 in | Wt 175.8 lb

## 2019-05-26 DIAGNOSIS — K222 Esophageal obstruction: Secondary | ICD-10-CM | POA: Diagnosis not present

## 2019-05-26 MED ORDER — ESOMEPRAZOLE MAGNESIUM 40 MG PO CPDR: DELAYED_RELEASE_CAPSULE | ORAL | 11 refills | Status: DC

## 2019-05-26 MED ORDER — ESOMEPRAZOLE MAGNESIUM 40 MG PO PACK: 40.0000 mg | PACK | Freq: Every day | ORAL | 11 refills | Status: DC

## 2019-05-26 NOTE — Addendum Note (Signed)
Addended by: Stevan Born on: 05/26/2019 12:50 PM   Modules accepted: Orders

## 2019-05-26 NOTE — Progress Notes (Signed)
Review of pertinent gastrointestinal problems: 1.  Adenomatous colon polyps.  Colonoscopy December 2015 Dr. Ardis Hughs found diverticulosis, hemorrhoids and a single subcentimeter adenomatous polyp.  Initially recommended for 5-year recall.  More recent guidelines for polyp surveillance change that interval to 7 years (recall 01/2021).  Colonoscopy June 2009 Dr. Sharlett Iles described "multiple polyps", "1 to 2 mm" removed none sent to pathology.  Colonoscopy 2004 Dr. Sharlett Iles no polyps seen.  Colonoscopy 2000 Dr. Sharlett Iles poor prep, 1 polyp removed but not retrieved.  Colonoscopy January 2021 for change in bowel habits and history of adenomatous polyps showed a 4 mm polyp in the descending colon which was removed.  Diverticulosis in the left colon.  The examination was otherwise normal.  The polyp was adenomatous.  Given his age no further surveillance was recommended. 2.  Dysphagia led to EGD January 2021 which showed GE junction esophageal stenosis (thick Schatzki's ring versus focal peptic stricture), biopsies were taken and the stricture was dilated with a balloon up to 18 mm.  Biopsies showed no sign of neoplasia.  We started him on omeprazole 40 mg once daily.  This was changed to H2 blocker when he called in with diarrhea and dyspepsia   HPI: This is a very pleasant 74 year old man whom I last saw 2 or 3 months ago the time of colonoscopy and upper endoscopy.  See those results above  His dysphagia is much improved.  Shortly after starting his omeprazole he began to be very bothered by intermittent postprandial chest discomforts.  He stopped the omeprazole and switch to Pepcid 20 mg twice daily and those symptoms have much improved.  He would like to be on a once daily medicine rather than a twice daily medicine.  He also mentions some bother with his hemorrhoids.  They periodically bleed and there is difficult to keep the area clean.   ROS: complete GI ROS as described in HPI, all other review  negative.  Constitutional:  No unintentional weight loss   Past Medical History:  Diagnosis Date  . Allergic rhinitis   . Arthritis   . BPH (benign prostatic hyperplasia)   . Cataract    bilateral   . Cervical spinal stenosis    Dr Beacher May  . Colon polyp   . Diverticulosis of colon   . ED (erectile dysfunction)   . Elevated PSA   . Hemorrhoids   . HTN (hypertension)   . Microcytosis     Past Surgical History:  Procedure Laterality Date  . cataract surg     BIL  . HEMORRHOID SURGERY    . TOENAIL EXCISION  2009   ALL  . TONSILLECTOMY    . VASECTOMY    . WISDOM TOOTH EXTRACTION      Current Outpatient Medications  Medication Sig Dispense Refill  . Cholecalciferol 1000 UNITS tablet Take 1,000 Units by mouth daily. Reported on 06/22/2015    . diclofenac sodium (VOLTAREN) 1 % GEL Apply 1 application topically 4 (four) times daily. (Patient taking differently: Apply 1 application topically 4 (four) times daily as needed. ) 100 g 3  . famotidine (PEPCID) 20 MG tablet Take 20 mg by mouth daily.    . fluticasone (FLONASE) 50 MCG/ACT nasal spray Place 2 sprays into both nostrils daily. 48 g 3  . loratadine (CLARITIN) 10 MG tablet Take 1 tablet (10 mg total) by mouth daily. (Patient taking differently: Take 10-20 mg by mouth daily. ) 100 tablet 3  . Melatonin 5 MG CAPS Take 5 mg by  mouth.    . naproxen sodium (ANAPROX) 220 MG tablet Take 1 tablet (220 mg total) by mouth daily as needed. 60 tablet 6  . Probiotic Product (PROBIOTIC PO) Take 500 Units by mouth daily.    . sildenafil (VIAGRA) 25 MG tablet Take 25 mg by mouth daily as needed for erectile dysfunction.    . triamcinolone cream (KENALOG) 0.5 % Apply 1 application topically 3 (three) times daily. 45 g 1  . valsartan-hydrochlorothiazide (DIOVAN-HCT) 320-25 MG tablet Take 1 tablet by mouth daily. 90 tablet 3  . zinc gluconate 50 MG tablet Take 50 mg by mouth daily.    Marland Kitchen zolpidem (AMBIEN) 10 MG tablet TAKE 1 TABLET BY MOUTH  AT BEDTIME 90 tablet 1   No current facility-administered medications for this visit.    Allergies as of 05/26/2019 - Review Complete 05/26/2019  Allergen Reaction Noted  . Levofloxacin Other (See Comments) 04/19/2007  . Omeprazole  05/26/2019    Family History  Problem Relation Age of Onset  . Alzheimer's disease Mother   . Heart disease Father   . Alzheimer's disease Brother   . Hypertension Other   . Colon cancer Neg Hx   . Esophageal cancer Neg Hx   . Stomach cancer Neg Hx   . Rectal cancer Neg Hx     Social History   Socioeconomic History  . Marital status: Married    Spouse name: Not on file  . Number of children: 2  . Years of education: Not on file  . Highest education level: Not on file  Occupational History  . Occupation: Publisher/ retired    Comment: Racing cars  Tobacco Use  . Smoking status: Never Smoker  . Smokeless tobacco: Never Used  Substance and Sexual Activity  . Alcohol use: No    Alcohol/week: 0.0 standard drinks  . Drug use: No  . Sexual activity: Yes  Other Topics Concern  . Not on file  Social History Narrative      Social Determinants of Health   Financial Resource Strain:   . Difficulty of Paying Living Expenses:   Food Insecurity:   . Worried About Charity fundraiser in the Last Year:   . Arboriculturist in the Last Year:   Transportation Needs:   . Film/video editor (Medical):   Marland Kitchen Lack of Transportation (Non-Medical):   Physical Activity:   . Days of Exercise per Week:   . Minutes of Exercise per Session:   Stress:   . Feeling of Stress :   Social Connections:   . Frequency of Communication with Friends and Family:   . Frequency of Social Gatherings with Friends and Family:   . Attends Religious Services:   . Active Member of Clubs or Organizations:   . Attends Archivist Meetings:   Marland Kitchen Marital Status:   Intimate Partner Violence:   . Fear of Current or Ex-Partner:   . Emotionally Abused:   Marland Kitchen  Physically Abused:   . Sexually Abused:      Physical Exam: BP 138/80   Pulse 64   Temp 97.6 F (36.4 C)   Ht 5\' 8"  (1.727 m)   Wt 175 lb 12.8 oz (79.7 kg)   BMI 26.73 kg/m  Constitutional: generally well-appearing Psychiatric: alert and oriented x3 Abdomen: soft, nontender, nondistended, no obvious ascites, no peritoneal signs, normal bowel sounds No peripheral edema noted in lower extremities  Assessment and plan: 74 y.o. male with GERD, benign esophageal stricture, hemorrhoids  He does have bothersome external hemorrhoids.  Unfortunately he does not have internal component based on colonoscopy retroflexed 2 months ago and so our in office banding procedure would not be indicated.  I offered referral to general surgery.  He declined as he had surgical repair of hemorrhoids 30 years ago it is quite painful he does not want to go through that again unless absolutely necessary.  He has chronic GERD which has caused peptic type stricture is lower esophagus which improved with balloon dilation.  He had dyspeptic symptoms on proton pump inhibitor omeprazole.  These are better when he is on H2 blocker twice daily but he would prefer once daily medicine so we are going to try him on Nexium 40 mg once daily.  He will call to report if he has any significant dyspeptic symptoms again immediately otherwise he will report his response in 4 weeks.  Please see the "Patient Instructions" section for addition details about the plan.  Owens Loffler, MD [facility] 05/26/2019, 11:26 AM   Total time on date of encounter was 30 minutes (this included time spent preparing to see the patient reviewing records; obtaining and/or reviewing separately obtained history; performing a medically appropriate exam and/or evaluation; counseling and educating the patient and family if present; ordering medications, tests or procedures if applicable; and documenting clinical information in the health  record).

## 2019-05-26 NOTE — Patient Instructions (Addendum)
If you are age 76 or older, your body mass index should be between 23-30. Your Body mass index is 26.73 kg/m. If this is out of the aforementioned range listed, please consider follow up with your Primary Care Provider.  If you are age 36 or younger, your body mass index should be between 19-25. Your Body mass index is 26.73 kg/m. If this is out of the aformentioned range listed, please consider follow up with your Primary Care Provider.   We have sent the following medications to your pharmacy for you to pick up at your convenience:  START: Nexium (esomeprazole) 40mg  take one capsule shortly before breakfast each day.  STOP: Pepcid  Please call our office in 4 weeks to let us know how you are doing after medication changes.  Thank you for entrusting me with your care and choosing [facility].  Dr Ardis Hughs

## 2019-07-03 ENCOUNTER — Encounter: Payer: Self-pay | Admitting: Internal Medicine

## 2019-07-03 ENCOUNTER — Ambulatory Visit (INDEPENDENT_AMBULATORY_CARE_PROVIDER_SITE_OTHER): Payer: Medicare Other | Admitting: Internal Medicine

## 2019-07-03 ENCOUNTER — Other Ambulatory Visit: Payer: Self-pay

## 2019-07-03 ENCOUNTER — Ambulatory Visit: Payer: Medicare Other

## 2019-07-03 VITALS — BP 130/82 | HR 86 | Temp 98.2°F | Ht 68.0 in | Wt 171.0 lb

## 2019-07-03 DIAGNOSIS — Z0000 Encounter for general adult medical examination without abnormal findings: Secondary | ICD-10-CM

## 2019-07-03 DIAGNOSIS — E559 Vitamin D deficiency, unspecified: Secondary | ICD-10-CM | POA: Diagnosis not present

## 2019-07-03 DIAGNOSIS — I10 Essential (primary) hypertension: Secondary | ICD-10-CM | POA: Diagnosis not present

## 2019-07-03 DIAGNOSIS — R1010 Upper abdominal pain, unspecified: Secondary | ICD-10-CM | POA: Diagnosis not present

## 2019-07-03 DIAGNOSIS — E785 Hyperlipidemia, unspecified: Secondary | ICD-10-CM | POA: Diagnosis not present

## 2019-07-03 DIAGNOSIS — R634 Abnormal weight loss: Secondary | ICD-10-CM | POA: Diagnosis not present

## 2019-07-03 DIAGNOSIS — R202 Paresthesia of skin: Secondary | ICD-10-CM | POA: Diagnosis not present

## 2019-07-03 LAB — CBC WITH DIFFERENTIAL/PLATELET
Basophils Absolute: 0.1 10*3/uL (ref 0.0–0.1)
Basophils Relative: 1.1 % (ref 0.0–3.0)
Eosinophils Absolute: 0.1 10*3/uL (ref 0.0–0.7)
Eosinophils Relative: 1.4 % (ref 0.0–5.0)
HCT: 42.3 % (ref 39.0–52.0)
Hemoglobin: 13.4 g/dL (ref 13.0–17.0)
Lymphocytes Relative: 20.1 % (ref 12.0–46.0)
Lymphs Abs: 1.1 10*3/uL (ref 0.7–4.0)
MCHC: 31.7 g/dL (ref 30.0–36.0)
MCV: 59 fl — ABNORMAL LOW (ref 78.0–100.0)
Monocytes Absolute: 0.5 10*3/uL (ref 0.1–1.0)
Monocytes Relative: 8.3 % (ref 3.0–12.0)
Neutro Abs: 3.8 10*3/uL (ref 1.4–7.7)
Neutrophils Relative %: 69.1 % (ref 43.0–77.0)
Platelets: 136 10*3/uL — ABNORMAL LOW (ref 150.0–400.0)
RBC: 7.22 Mil/uL — ABNORMAL HIGH (ref 4.22–5.81)
RDW: 16.4 % — ABNORMAL HIGH (ref 11.5–15.5)
WBC: 5.5 10*3/uL (ref 4.0–10.5)

## 2019-07-03 LAB — BASIC METABOLIC PANEL
BUN: 21 mg/dL (ref 6–23)
CO2: 32 mEq/L (ref 19–32)
Calcium: 9.7 mg/dL (ref 8.4–10.5)
Chloride: 101 mEq/L (ref 96–112)
Creatinine, Ser: 1.23 mg/dL (ref 0.40–1.50)
GFR: 57.24 mL/min — ABNORMAL LOW (ref >60.00)
Glucose, Bld: 102 mg/dL — ABNORMAL HIGH (ref 70–99)
Potassium: 4.1 mEq/L (ref 3.5–5.1)
Sodium: 139 mEq/L (ref 135–145)

## 2019-07-03 LAB — URINALYSIS
Bilirubin Urine: NEGATIVE
Hgb urine dipstick: NEGATIVE
Ketones, ur: NEGATIVE
Leukocytes,Ua: NEGATIVE
Nitrite: NEGATIVE
Specific Gravity, Urine: 1.01 (ref 1.000–1.030)
Total Protein, Urine: NEGATIVE
Urine Glucose: NEGATIVE
Urobilinogen, UA: 0.2 (ref 0.0–1.0)
pH: 6 (ref 5.0–8.0)

## 2019-07-03 LAB — T4, FREE: Free T4: 1.01 ng/dL (ref 0.60–1.60)

## 2019-07-03 LAB — LIPID PANEL
Cholesterol: 189 mg/dL (ref 0–200)
HDL: 44.7 mg/dL (ref >39.00)
LDL Cholesterol: 129 mg/dL — ABNORMAL HIGH (ref 0–99)
NonHDL: 144.01
Total CHOL/HDL Ratio: 4
Triglycerides: 76 mg/dL (ref 0.0–149.0)
VLDL: 15.2 mg/dL (ref 0.0–40.0)

## 2019-07-03 LAB — TSH: TSH: 1.22 u[IU]/mL (ref 0.35–4.50)

## 2019-07-03 LAB — HEPATIC FUNCTION PANEL
ALT: 12 U/L (ref 0–53)
AST: 16 U/L (ref 0–37)
Albumin: 4.7 g/dL (ref 3.5–5.2)
Alkaline Phosphatase: 64 U/L (ref 39–117)
Bilirubin, Direct: 0.2 mg/dL (ref 0.0–0.3)
Total Bilirubin: 1.4 mg/dL — ABNORMAL HIGH (ref 0.2–1.2)
Total Protein: 7.1 g/dL (ref 6.0–8.3)

## 2019-07-03 LAB — VITAMIN D 25 HYDROXY (VIT D DEFICIENCY, FRACTURES): VITD: 59.31 ng/mL (ref 30.00–100.00)

## 2019-07-03 LAB — VITAMIN B12: Vitamin B-12: 276 pg/mL (ref 211–911)

## 2019-07-03 MED ORDER — ZOLPIDEM TARTRATE 10 MG PO TABS: 10.0000 mg | ORAL_TABLET | Freq: Every day | ORAL | 1 refills | Status: DC

## 2019-07-03 MED ORDER — VALSARTAN-HYDROCHLOROTHIAZIDE 320-25 MG PO TABS: 1.0000 | ORAL_TABLET | Freq: Every day | ORAL | 3 refills | Status: DC

## 2019-07-03 MED ORDER — VITAMIN D3 50 MCG (2000 UT) PO CAPS: 2000.0000 [IU] | ORAL_CAPSULE | Freq: Every day | ORAL | 3 refills | Status: DC

## 2019-07-03 MED ORDER — FLUTICASONE PROPIONATE 50 MCG/ACT NA SUSP: 2.0000 | Freq: Every day | NASAL | 3 refills | Status: DC

## 2019-07-03 NOTE — Assessment & Plan Note (Signed)
A cardiac CT scan for coronary calcium offered 

## 2019-07-03 NOTE — Assessment & Plan Note (Signed)
25 lbs - intentional vs other CXR vs CT Labs Abd [place]

## 2019-07-03 NOTE — Patient Instructions (Signed)

## 2019-07-03 NOTE — Progress Notes (Signed)
Subjective:  Patient ID: Chad Palmer, male    DOB: 05/08/1945  Age: 74 y.o. MRN: [id number]  CC: No chief complaint on file.   HPI Chad Palmer presents for a well exam C/o wt loss on diet -- 25 lbs F/u insomnia, GERD, HTN   Outpatient Medications Prior to Visit  Medication Sig Dispense Refill  . Cholecalciferol 1000 UNITS tablet Take 1,000 Units by mouth daily. Reported on 06/22/2015    . diclofenac sodium (VOLTAREN) 1 % GEL Apply 1 application topically 4 (four) times daily. (Patient taking differently: Apply 1 application topically 4 (four) times daily as needed. ) 100 g 3  . esomeprazole (NEXIUM) 40 MG capsule Take 1 capsule shortly before breakfast each day. 30 capsule 11  . fluticasone (FLONASE) 50 MCG/ACT nasal spray Place 2 sprays into both nostrils daily. 48 g 3  . loratadine (CLARITIN) 10 MG tablet Take 1 tablet (10 mg total) by mouth daily. (Patient taking differently: Take 10-20 mg by mouth daily. ) 100 tablet 3  . Melatonin 5 MG CAPS Take 5 mg by mouth.    . naproxen sodium (ANAPROX) 220 MG tablet Take 1 tablet (220 mg total) by mouth daily as needed. 60 tablet 6  . Probiotic Product ([employer] ADV PROBIOTIC GUMMIES) CHEW Chew 2 each by mouth at bedtime.    . Probiotic Product (PROBIOTIC PO) Take 500 Units by mouth daily.    . sildenafil (VIAGRA) 25 MG tablet Take 25 mg by mouth daily as needed for erectile dysfunction.    . triamcinolone cream (KENALOG) 0.5 % Apply 1 application topically 3 (three) times daily. 45 g 1  . valsartan-hydrochlorothiazide (DIOVAN-HCT) 320-25 MG tablet Take 1 tablet by mouth daily. 90 tablet 3  . zinc gluconate 50 MG tablet Take 50 mg by mouth daily.    Marland Kitchen zolpidem (AMBIEN) 10 MG tablet TAKE 1 TABLET BY MOUTH AT BEDTIME 90 tablet 1   No facility-administered medications prior to visit.    ROS: Review of Systems  Constitutional: Positive for unexpected weight change. Negative for appetite change and fatigue.  HENT: Negative for congestion,  nosebleeds, sneezing, sore throat and trouble swallowing.   Eyes: Negative for itching and visual disturbance.  Respiratory: Negative for cough.   Cardiovascular: Negative for chest pain, palpitations and leg swelling.  Gastrointestinal: Negative for abdominal distention, blood in stool, diarrhea and nausea.  Genitourinary: Negative for frequency and hematuria.  Musculoskeletal: Negative for back pain, gait problem, joint swelling and neck pain.  Skin: Negative for rash.  Neurological: Negative for dizziness, tremors, speech difficulty and weakness.  Psychiatric/Behavioral: Negative for agitation, dysphoric mood and sleep disturbance. The patient is not nervous/anxious.     Objective:  BP 130/82 (BP Location: Left Arm, Patient Position: Sitting, Cuff Size: Normal)   Pulse 86   Temp 98.2 F (36.8 C) (Oral)   Ht 5\' 8"  (1.727 m)   Wt 171 lb (77.6 kg)   SpO2 98%   BMI 26.00 kg/m   BP Readings from Last 3 Encounters:  07/03/19 130/82  05/26/19 138/80  02/19/19 112/68    Wt Readings from Last 3 Encounters:  07/03/19 171 lb (77.6 kg)  05/26/19 175 lb 12.8 oz (79.7 kg)  02/19/19 180 lb 3.2 oz (81.7 kg)    Physical Exam Constitutional:      General: He is not in acute distress.    Appearance: He is well-developed.     Comments: NAD  Eyes:     Conjunctiva/sclera: Conjunctivae normal.  Pupils: Pupils are equal, round, and reactive to light.  Neck:     Thyroid: No thyromegaly.     Vascular: No JVD.  Cardiovascular:     Rate and Rhythm: Normal rate and regular rhythm.     Heart sounds: Normal heart sounds. No murmur. No friction rub. No gallop.   Pulmonary:     Effort: Pulmonary effort is normal. No respiratory distress.     Breath sounds: Normal breath sounds. No wheezing or rales.  Chest:     Chest wall: No tenderness.  Abdominal:     General: Bowel sounds are normal. There is no distension.     Palpations: Abdomen is soft. There is no mass.     Tenderness: There is  no abdominal tenderness. There is no guarding or rebound.  Musculoskeletal:        General: No tenderness. Normal range of motion.     Cervical back: Normal range of motion.  Lymphadenopathy:     Cervical: No cervical adenopathy.  Skin:    General: Skin is warm and dry.     Findings: No rash.  Neurological:     Mental Status: He is alert and oriented to person, place, and time.     Cranial Nerves: No cranial nerve deficit.     Motor: No abnormal muscle tone.     Coordination: Coordination normal.     Gait: Gait normal.     Deep Tendon Reflexes: Reflexes are normal and symmetric.  Psychiatric:        Behavior: Behavior normal.        Thought Content: Thought content normal.        Judgment: Judgment normal.     Lab Results  Component Value Date   WBC 5.7 07/02/2018   HGB 13.9 07/02/2018   HCT 42.5 07/02/2018   PLT 165.0 07/02/2018   GLUCOSE 111 (H) 07/02/2018   CHOL 220 (H) 07/02/2018   TRIG 119.0 07/02/2018   HDL 43.40 07/02/2018   LDLCALC 153 (H) 07/02/2018   ALT 17 07/02/2018   AST 16 07/02/2018   NA 140 07/02/2018   K 4.0 07/02/2018   CL 102 07/02/2018   CREATININE 1.25 07/02/2018   BUN 17 07/02/2018   CO2 29 07/02/2018   TSH 1.42 07/02/2018   PSA 10.49 (H) 06/27/2017    DG Chest 2 View  Result Date: 06/22/2015 CLINICAL DATA:  Dyspnea on exertion for the past 3 months EXAM: CHEST  2 VIEW COMPARISON:  PA and lateral chest x-ray of March 25, 2014 FINDINGS: The lungs are adequately inflated. There is no focal infiltrate. There is no pleural effusion. There is a stable 3 mm diameter left upper lobe nodule. The heart and pulmonary vascularity are normal. The mediastinum is normal in width. There is tortuosity of the descending thoracic aorta. There is mild multilevel degenerative disc disease of the thoracic spine. IMPRESSION: Chronic bronchitic changes. There is no evidence of pneumonia, CHF, nor other acute cardiopulmonary abnormality. Electronically Signed   By:  David  Martinique M.D.   On: 06/22/2015 10:38    Assessment & Plan:    Walker Kehr, MD

## 2019-07-04 ENCOUNTER — Ambulatory Visit (INDEPENDENT_AMBULATORY_CARE_PROVIDER_SITE_OTHER): Payer: Medicare Other

## 2019-07-04 ENCOUNTER — Other Ambulatory Visit: Payer: Self-pay

## 2019-07-04 DIAGNOSIS — Z0000 Encounter for general adult medical examination without abnormal findings: Secondary | ICD-10-CM | POA: Diagnosis not present

## 2019-07-04 NOTE — Progress Notes (Signed)
I connected with Chad Palmer today by telephone and verified that I am speaking with the correct person using two identifiers. Location patient: home Location provider: work Persons participating in the virtual visit: Chad Palmer and Chad Abu, LPN  I discussed the limitations, risks, security and privacy concerns of performing an evaluation and management service by telephone and the availability of in person appointments. I also discussed with the patient that there may be a patient responsible charge related to this service. The patient expressed understanding and verbally consented to this telephonic visit.    Interactive audio and video telecommunications were attempted between this provider and patient, however failed, due to patient having technical difficulties OR patient did not have access to video capability.  We continued and completed visit with audio only.  Some vital signs may be absent or patient reported.   Time Spent with patient on telephone encounter: 30 minutes  Subjective:   Chad Palmer is a 74 y.o. male who presents for Medicare Annual/Subsequent preventive examination.  Review of Systems:  No ROS. Medicare Wellness Visit Cardiac Risk Factors include: advanced age (>37men, >60 women);family history of premature cardiovascular disease;hypertension;male gender     Objective:    Vitals: There were no vitals taken for this visit.  There is no height or weight on file to calculate BMI.  Advanced Directives 07/04/2019 07/02/2018 06/22/2017  Does Patient Have a Medical Advance Directive? Yes Yes Yes  Type of Paramedic of [place];Living will [place];Living will [place];Living will  Does patient want to make changes to medical advance directive? No - Patient declined - -  Copy of [place] in Chart? No - copy requested No - copy requested No - copy requested    Tobacco  Social History   Tobacco Use  Smoking Status Never Smoker  Smokeless Tobacco Never Used     Counseling given: Not Answered   Clinical Intake:  Pre-visit preparation completed: Yes  Pain : No/denies pain     Nutritional Risks: None Diabetes: No  How often do you need to have someone help you when you read instructions, pamphlets, or other written materials from your doctor or pharmacy?: 1 - Never What is the last grade level you completed in school?: Bachelor's Degree  Interpreter Needed?: No  Information entered by :: Shenika N. Lowell Guitar, LPN  Past Medical History:  Diagnosis Date  . Allergic rhinitis   . Arthritis   . BPH (benign prostatic hyperplasia)   . Cataract    bilateral   . Cervical spinal stenosis    Dr Beacher May  . Colon polyp   . Diverticulosis of colon   . ED (erectile dysfunction)   . Elevated PSA   . Hemorrhoids   . HTN (hypertension)   . Microcytosis    Past Surgical History:  Procedure Laterality Date  . cataract surg     BIL  . HEMORRHOID SURGERY    . TOENAIL EXCISION  2009   ALL  . TONSILLECTOMY    . VASECTOMY    . WISDOM TOOTH EXTRACTION     Family History  Problem Relation Age of Onset  . Alzheimer's disease Mother   . Heart disease Father   . Alzheimer's disease Brother   . Hypertension Other   . Colon cancer Neg Hx   . Esophageal cancer Neg Hx   . Stomach cancer Neg Hx   . Rectal cancer Neg Hx  Social History   Socioeconomic History  . Marital status: Married    Spouse name: Not on file  . Number of children: 2  . Years of education: Not on file  . Highest education level: Not on file  Occupational History  . Occupation: Publisher/ retired    Comment: Racing cars  Tobacco Use  . Smoking status: Never Smoker  . Smokeless tobacco: Never Used  Substance and Sexual Activity  . Alcohol use: No    Alcohol/week: 0.0 standard drinks  . Drug use: No  . Sexual activity: Yes  Other Topics Concern  . Not on file   Social History Narrative      Social Determinants of Health   Financial Resource Strain:   . Difficulty of Paying Living Expenses:   Food Insecurity:   . Worried About Charity fundraiser in the Last Year:   . Arboriculturist in the Last Year:   Transportation Needs:   . Film/video editor (Medical):   Marland Kitchen Lack of Transportation (Non-Medical):   Physical Activity:   . Days of Exercise per Week:   . Minutes of Exercise per Session:   Stress:   . Feeling of Stress :   Social Connections:   . Frequency of Communication with Friends and Family:   . Frequency of Social Gatherings with Friends and Family:   . Attends Religious Services:   . Active Member of Clubs or Organizations:   . Attends Archivist Meetings:   Marland Kitchen Marital Status:     Outpatient Encounter Medications as of 07/04/2019  Medication Sig  . Cholecalciferol (VITAMIN D3) 50 MCG (2000 UT) capsule Take 1 capsule (2,000 Units total) by mouth daily.  . diclofenac sodium (VOLTAREN) 1 % GEL Apply 1 application topically 4 (four) times daily. (Patient taking differently: Apply 1 application topically 4 (four) times daily as needed. )  . esomeprazole (NEXIUM) 40 MG capsule Take 1 capsule shortly before breakfast each day.  . fluticasone (FLONASE) 50 MCG/ACT nasal spray Place 2 sprays into both nostrils daily.  Marland Kitchen loratadine (CLARITIN) 10 MG tablet Take 1 tablet (10 mg total) by mouth daily. (Patient taking differently: Take 10-20 mg by mouth daily. )  . Melatonin 5 MG CAPS Take 5 mg by mouth.  . naproxen sodium (ANAPROX) 220 MG tablet Take 1 tablet (220 mg total) by mouth daily as needed.  . Probiotic Product ([employer]S ADV PROBIOTIC GUMMIES) CHEW Chew 2 each by mouth at bedtime.  . Probiotic Product (PROBIOTIC PO) Take 500 Units by mouth daily.  . sildenafil (VIAGRA) 25 MG tablet Take 25 mg by mouth daily as needed for erectile dysfunction.  . triamcinolone cream (KENALOG) 0.5 % Apply 1 application topically 3 (three) times  daily.  . valsartan-hydrochlorothiazide (DIOVAN-HCT) 320-25 MG tablet Take 1 tablet by mouth daily.  Marland Kitchen zolpidem (AMBIEN) 10 MG tablet Take 1 tablet (10 mg total) by mouth at bedtime.   No facility-administered encounter medications on file as of 07/04/2019.    Activities of Daily Living In your present state of health, do you have any difficulty performing the following activities: 07/04/2019  Hearing? N  Vision? N  Difficulty concentrating or making decisions? N  Walking or climbing stairs? N  Dressing or bathing? N  Doing errands, shopping? N  Preparing Food and eating ? N  Using the Toilet? N  In the past six months, have you accidently leaked urine? N  Do you have problems with loss of bowel control? N  Managing your Medications? N  Managing your Finances? N  Housekeeping or managing your Housekeeping? N  Some recent data might be hidden    Patient Care Team: Plotnikov, Evie Lacks, MD as PCP - General Franchot Gallo, MD (Urology) Rutherford Guys, MD (Ophthalmology) Sable Feil, MD as Attending Physician (Gastroenterology)   Assessment:   This is a routine wellness examination for Chad Palmer.  Exercise Activities and Dietary recommendations Current Exercise Habits: Home exercise routine, Type of exercise: walking;Other - see comments(yard work), Time (Minutes): 30, Frequency (Times/Week): 5, Weekly Exercise (Minutes/Week): 150, Intensity: Moderate, Exercise limited by: None identified  Goals    . Client understands the importance of follow-up with providers by attending scheduled visits    . DIET - INCREASE WATER INTAKE    . Patient Stated     Increase my physical activity by stretching after I walk.     . Patient Stated     Maintain current health status. Continue to work on my cars.     . Patient Stated (pt-stated)     To maintain my current health status by continuing to eat healthy and stay physically active & socially active.       Fall Risk Fall Risk   07/04/2019 07/03/2019 07/02/2018 06/22/2017 08/29/2016  Falls in the past year? 0 0 0 Yes No  Comment - - - - Emmi Telephone Survey: data to providers prior to load  Number falls in past yr: 0 0 0 1 -  Injury with Fall? 0 0 - No -  Risk for fall due to : No Fall Risks - - - -  Follow up Falls evaluation completed;Education provided - - Falls prevention discussed -   Is the patient's home free of loose throw rugs in walkways, pet beds, electrical cords, etc?   yes      Grab bars in the bathroom? yes      Handrails on the stairs?   yes      Adequate lighting?   yes  Timed Get Up and Go Performed: not indicated  Depression Screen PHQ 2/9 Scores 07/03/2019 07/02/2018 06/22/2017 06/22/2015  PHQ - 2 Score 0 0 0 0    Cognitive Function MMSE - Mini Mental State Exam 07/02/2018 06/22/2017  Orientation to time 5 5  Orientation to Place 5 5  Registration 3 3  Attention/ Calculation 5 5  Recall 3 3  Language- name 2 objects 2 2  Language- repeat 1 1  Language- follow 3 step command 3 3  Language- read & follow direction 1 1  Write a sentence 1 1  Copy design 1 1  Total score 30 30        Immunization History  Administered Date(s) Administered  . Influenza, High Dose Seasonal PF 12/12/2014, 12/11/2017, 12/11/2017  . Influenza,inj,Quad PF,6+ Mos 11/28/2016  . Influenza-Unspecified 11/06/2012, 11/06/2013  . Moderna SARS-COVID-2 Vaccination 03/06/2019, 04/03/2019  . Pneumococcal Conjugate-13 03/24/2013  . Pneumococcal Polysaccharide-23 04/23/2009, 06/22/2015  . Td 05/16/2010  . Zoster 09/06/2007    Qualifies for Shingles Vaccine?  Yes, will check with local pharmacy ([facility] handout mailed to patient)  Screening Tests Health Maintenance  Topic Date Due  . INFLUENZA VACCINE  09/07/2019  . TETANUS/TDAP  05/15/2020  . COLONOSCOPY  02/19/2024  . COVID-19 Vaccine  Completed  . Hepatitis C Screening  Completed  . PNA vac Low Risk Adult  Completed   Cancer Screenings: Lung: Low Dose CT  Chest recommended if Age 63-80 years, 30 pack-year currently smoking OR  have quit w/in 15years. Patient does not qualify. Colorectal: Yes  Additional Screenings: Hepatitis C Screening: completed      Plan:     Reviewed health maintenance screenings with patient today and relevant education, vaccines, and/or referrals were provided.    Continue doing brain stimulating activities (puzzles, reading, adult coloring books, staying active) to keep memory sharp.    Continue to eat heart healthy diet (full of fruits, vegetables, whole grains, lean protein, water--limit salt, fat, and sugar intake) and increase physical activity as tolerated.  I have personally reviewed and noted the following in the patient's chart:   . Medical and social history . Use of alcohol, tobacco or illicit drugs  . Current medications and supplements . Functional ability and status . Nutritional status . Physical activity . Advanced directives . List of other physicians . Hospitalizations, surgeries, and ER visits in previous 12 months . Vitals . Screenings to include cognitive, depression, and falls . Referrals and appointments  In addition, I have reviewed and discussed with patient certain preventive protocols, quality metrics, and best practice recommendations. A written personalized care plan for preventive services as well as general preventive health recommendations were provided to patient.     Sheral Flow, LPN  [id number] Nurse Health Advisor  Nurse Note: There were no vitals filed for this visit. There is no height or weight on file to calculate BMI.

## 2019-07-04 NOTE — Patient Instructions (Signed)
Mr. Chad Palmer , Thank you for taking time to come for your Medicare Wellness Visit. I appreciate your ongoing commitment to your health goals. Please review the following plan we discussed and let me know if I can assist you in the future.   Screening recommendations/referrals: Colonoscopy: 02/19/2019 Recommended yearly ophthalmology/optometry visit for glaucoma screening and checkup Recommended yearly dental visit for hygiene and checkup  Vaccinations: Influenza vaccine: 12/25/2018 Pneumococcal vaccine: completed Tdap vaccine: 05/16/2010; due every 10 years Shingles vaccine: will check with local pharmacy (Shinrix vaccine sheet mailed to patient)   Covid-19: completed  Advanced directives: Yes  Conditions/risks identified: Yes  Next appointment: Please schedule your next Medicare Wellness Visit with your Health Coach in 1 year.  Preventive Care 76 Years and Older, Male Preventive care refers to lifestyle choices and visits with your health care provider that can promote health and wellness. What does preventive care include?  A yearly physical exam. This is also called an annual well check.  Dental exams once or twice a year.  Routine eye exams. Ask your health care provider how often you should have your eyes checked.  Personal lifestyle choices, including:  Daily care of your teeth and gums.  Regular physical activity.  Eating a healthy diet.  Avoiding tobacco and drug use.  Limiting alcohol use.  Practicing safe sex.  Taking low doses of aspirin every day.  Taking vitamin and mineral supplements as recommended by your health care provider. What happens during an annual well check? The services and screenings done by your health care provider during your annual well check will depend on your age, overall health, lifestyle risk factors, and family history of disease. Counseling  Your health care provider may ask you questions about your:  Alcohol use.  Tobacco  use.  Drug use.  Emotional well-being.  Home and relationship well-being.  Sexual activity.  Eating habits.  History of falls.  Memory and ability to understand (cognition).  Work and work Statistician. Screening  You may have the following tests or measurements:  Height, weight, and BMI.  Blood pressure.  Lipid and cholesterol levels. These may be checked every 5 years, or more frequently if you are over 41 years old.  Skin check.  Lung cancer screening. You may have this screening every year starting at age 65 if you have a 30-pack-year history of smoking and currently smoke or have quit within the past 15 years.  Fecal occult blood test (FOBT) of the stool. You may have this test every year starting at age 56.  Flexible sigmoidoscopy or colonoscopy. You may have a sigmoidoscopy every 5 years or a colonoscopy every 10 years starting at age 9.  Prostate cancer screening. Recommendations will vary depending on your family history and other risks.  Hepatitis C blood test.  Hepatitis B blood test.  Sexually transmitted disease (STD) testing.  Diabetes screening. This is done by checking your blood sugar (glucose) after you have not eaten for a while (fasting). You may have this done every 1-3 years.  Abdominal aortic aneurysm (AAA) screening. You may need this if you are a current or former smoker.  Osteoporosis. You may be screened starting at age 49 if you are at high risk. Talk with your health care provider about your test results, treatment options, and if necessary, the need for more tests. Vaccines  Your health care provider may recommend certain vaccines, such as:  Influenza vaccine. This is recommended every year.  Tetanus, diphtheria, and acellular pertussis (Tdap,  Td) vaccine. You may need a Td booster every 10 years.  Zoster vaccine. You may need this after age 63.  Pneumococcal 13-valent conjugate (PCV13) vaccine. One dose is recommended after age  25.  Pneumococcal polysaccharide (PPSV23) vaccine. One dose is recommended after age 35. Talk to your health care provider about which screenings and vaccines you need and how often you need them. This information is not intended to replace advice given to you by your health care provider. Make sure you discuss any questions you have with your health care provider. Document Released: 02/19/2015 Document Revised: 10/13/2015 Document Reviewed: 11/24/2014 Elsevier Interactive Patient Education  2017 [place] Prevention in the Home Falls can cause injuries. They can happen to people of all ages. There are many things you can do to make your home safe and to help prevent falls. What can I do on the outside of my home?  Regularly fix the edges of walkways and driveways and fix any cracks.  Remove anything that might make you trip as you walk through a door, such as a raised step or threshold.  Trim any bushes or trees on the path to your home.  Use bright outdoor lighting.  Clear any walking paths of anything that might make someone trip, such as rocks or tools.  Regularly check to see if handrails are loose or broken. Make sure that both sides of any steps have handrails.  Any raised decks and porches should have guardrails on the edges.  Have any leaves, snow, or ice cleared regularly.  Use sand or salt on walking paths during winter.  Clean up any spills in your garage right away. This includes oil or grease spills. What can I do in the bathroom?  Use night lights.  Install grab bars by the toilet and in the tub and shower. Do not use towel bars as grab bars.  Use non-skid mats or decals in the tub or shower.  If you need to sit down in the shower, use a plastic, non-slip stool.  Keep the floor dry. Clean up any water that spills on the floor as soon as it happens.  Remove soap buildup in the tub or shower regularly.  Attach bath mats securely with double-sided  non-slip rug tape.  Do not have throw rugs and other things on the floor that can make you trip. What can I do in the bedroom?  Use night lights.  Make sure that you have a light by your bed that is easy to reach.  Do not use any sheets or blankets that are too big for your bed. They should not hang down onto the floor.  Have a firm chair that has side arms. You can use this for support while you get dressed.  Do not have throw rugs and other things on the floor that can make you trip. What can I do in the kitchen?  Clean up any spills right away.  Avoid walking on wet floors.  Keep items that you use a lot in easy-to-reach places.  If you need to reach something above you, use a strong step stool that has a grab bar.  Keep electrical cords out of the way.  Do not use floor polish or wax that makes floors slippery. If you must use wax, use non-skid floor wax.  Do not have throw rugs and other things on the floor that can make you trip. What can I do with my stairs?  Do not  leave any items on the stairs.  Make sure that there are handrails on both sides of the stairs and use them. Fix handrails that are broken or loose. Make sure that handrails are as long as the stairways.  Check any carpeting to make sure that it is firmly attached to the stairs. Fix any carpet that is loose or worn.  Avoid having throw rugs at the top or bottom of the stairs. If you do have throw rugs, attach them to the floor with carpet tape.  Make sure that you have a light switch at the top of the stairs and the bottom of the stairs. If you do not have them, ask someone to add them for you. What else can I do to help prevent falls?  Wear shoes that:  Do not have high heels.  Have rubber bottoms.  Are comfortable and fit you well.  Are closed at the toe. Do not wear sandals.  If you use a stepladder:  Make sure that it is fully opened. Do not climb a closed stepladder.  Make sure that both  sides of the stepladder are locked into place.  Ask someone to hold it for you, if possible.  Clearly mark and make sure that you can see:  Any grab bars or handrails.  First and last steps.  Where the edge of each step is.  Use tools that help you move around (mobility aids) if they are needed. These include:  Canes.  Walkers.  Scooters.  Crutches.  Turn on the lights when you go into a dark area. Replace any light bulbs as soon as they burn out.  Set up your furniture so you have a clear path. Avoid moving your furniture around.  If any of your floors are uneven, fix them.  If there are any pets around you, be aware of where they are.  Review your medicines with your doctor. Some medicines can make you feel dizzy. This can increase your chance of falling. Ask your doctor what other things that you can do to help prevent falls. This information is not intended to replace advice given to you by your health care provider. Make sure you discuss any questions you have with your health care provider. Document Released: 11/19/2008 Document Revised: 07/01/2015 Document Reviewed: 02/27/2014 Elsevier Interactive Patient Education  2017 [employer].

## 2019-07-07 ENCOUNTER — Other Ambulatory Visit: Payer: Self-pay | Admitting: Internal Medicine

## 2019-07-07 MED ORDER — B COMPLEX PLUS PO TABS
1.0000 | ORAL_TABLET | Freq: Every day | ORAL | 3 refills | Status: DC
Start: 2019-07-07 — End: 2019-12-08

## 2019-07-10 ENCOUNTER — Telehealth: Payer: Self-pay

## 2019-07-10 NOTE — Telephone Encounter (Signed)
New message   Seen  [phone]  Checking on the status referral asking for a callback

## 2019-07-15 ENCOUNTER — Other Ambulatory Visit: Payer: Self-pay

## 2019-07-15 ENCOUNTER — Ambulatory Visit (INDEPENDENT_AMBULATORY_CARE_PROVIDER_SITE_OTHER)
Admission: RE | Admit: 2019-07-15 | Discharge: 2019-07-15 | Disposition: A | Discharge status: Home / Self Care | Payer: Self-pay | Source: Ambulatory Visit | Attending: Internal Medicine | Admitting: Internal Medicine

## 2019-07-15 DIAGNOSIS — I10 Essential (primary) hypertension: Secondary | ICD-10-CM

## 2019-07-15 DIAGNOSIS — E785 Hyperlipidemia, unspecified: Secondary | ICD-10-CM

## 2019-07-15 DIAGNOSIS — R911 Solitary pulmonary nodule: Secondary | ICD-10-CM | POA: Diagnosis not present

## 2019-07-21 ENCOUNTER — Ambulatory Visit
Admission: RE | Admit: 2019-07-21 | Discharge: 2019-07-21 | Disposition: A | Discharge status: Home / Self Care | Payer: Medicare Other | Source: Ambulatory Visit | Attending: Internal Medicine | Admitting: Internal Medicine

## 2019-07-21 DIAGNOSIS — K8020 Calculus of gallbladder without cholecystitis without obstruction: Secondary | ICD-10-CM | POA: Diagnosis not present

## 2019-10-01 DIAGNOSIS — R972 Elevated prostate specific antigen [PSA]: Secondary | ICD-10-CM | POA: Diagnosis not present

## 2019-10-01 DIAGNOSIS — N401 Enlarged prostate with lower urinary tract symptoms: Secondary | ICD-10-CM | POA: Diagnosis not present

## 2019-10-01 DIAGNOSIS — R3915 Urgency of urination: Secondary | ICD-10-CM | POA: Diagnosis not present

## 2019-10-17 ENCOUNTER — Other Ambulatory Visit: Payer: Self-pay | Admitting: Orthopedic Surgery

## 2019-10-17 DIAGNOSIS — M545 Low back pain: Secondary | ICD-10-CM | POA: Diagnosis not present

## 2019-10-17 DIAGNOSIS — M546 Pain in thoracic spine: Secondary | ICD-10-CM

## 2019-11-04 ENCOUNTER — Ambulatory Visit
Admission: RE | Admit: 2019-11-04 | Discharge: 2019-11-04 | Disposition: A | Discharge status: Home / Self Care | Payer: Medicare Other | Source: Ambulatory Visit | Attending: Orthopedic Surgery | Admitting: Orthopedic Surgery

## 2019-11-04 DIAGNOSIS — M546 Pain in thoracic spine: Secondary | ICD-10-CM

## 2019-11-04 DIAGNOSIS — N2889 Other specified disorders of kidney and ureter: Secondary | ICD-10-CM | POA: Diagnosis not present

## 2019-11-04 DIAGNOSIS — R634 Abnormal weight loss: Secondary | ICD-10-CM | POA: Diagnosis not present

## 2019-11-04 DIAGNOSIS — R911 Solitary pulmonary nodule: Secondary | ICD-10-CM | POA: Diagnosis not present

## 2019-11-04 MED ORDER — IOPAMIDOL (ISOVUE-300) INJECTION 61%
100.0000 mL | Freq: Once | INTRAVENOUS | Status: AC | PRN
  Administered 2019-11-04: 100 mL via INTRAVENOUS

## 2019-11-06 DIAGNOSIS — M545 Low back pain: Secondary | ICD-10-CM | POA: Diagnosis not present

## 2019-11-13 ENCOUNTER — Encounter: Payer: Self-pay | Admitting: Internal Medicine

## 2019-11-13 ENCOUNTER — Ambulatory Visit (INDEPENDENT_AMBULATORY_CARE_PROVIDER_SITE_OTHER): Payer: Medicare Other | Admitting: Internal Medicine

## 2019-11-13 ENCOUNTER — Other Ambulatory Visit: Payer: Self-pay

## 2019-11-13 DIAGNOSIS — E041 Nontoxic single thyroid nodule: Secondary | ICD-10-CM | POA: Insufficient documentation

## 2019-11-13 DIAGNOSIS — N2889 Other specified disorders of kidney and ureter: Secondary | ICD-10-CM | POA: Diagnosis not present

## 2019-11-13 LAB — COMPREHENSIVE METABOLIC PANEL
ALT: 13 U/L (ref 0–53)
AST: 14 U/L (ref 0–37)
Albumin: 4.2 g/dL (ref 3.5–5.2)
Alkaline Phosphatase: 68 U/L (ref 39–117)
BUN: 22 mg/dL (ref 6–23)
CO2: 33 mEq/L — ABNORMAL HIGH (ref 19–32)
Calcium: 9.2 mg/dL (ref 8.4–10.5)
Chloride: 101 mEq/L (ref 96–112)
Creatinine, Ser: 1.16 mg/dL (ref 0.40–1.50)
GFR: 60.75 mL/min (ref >60.00)
Glucose, Bld: 91 mg/dL (ref 70–99)
Potassium: 4 mEq/L (ref 3.5–5.1)
Sodium: 140 mEq/L (ref 135–145)
Total Bilirubin: 1 mg/dL (ref 0.2–1.2)
Total Protein: 6.9 g/dL (ref 6.0–8.3)

## 2019-11-13 LAB — TSH: TSH: 2.02 u[IU]/mL (ref 0.35–4.50)

## 2019-11-13 NOTE — Assessment & Plan Note (Addendum)
L 10/21 on CT PET CT was ordered. Labs The patient has an appointment with Dr. Alinda Money

## 2019-11-13 NOTE — Progress Notes (Signed)
Subjective:  Patient ID: Chad Palmer, male    DOB: [birth year]  Age: 74 y.o. MRN: [id number]  CC: Follow-up   HPI VERLAN GROTZ presents for L renal tumor and a thyroid nodule.  No recent weight loss.  Recent CT scans, abdominal ultrasound reviewed.  The patient continues to have left-sided back pain.    Outpatient Medications Prior to Visit  Medication Sig Dispense Refill  . B Complex-Folic Acid (B COMPLEX PLUS) TABS Take 1 tablet by mouth daily. 100 tablet 3  . Cholecalciferol (VITAMIN D3) 50 MCG (2000 UT) capsule Take 1 capsule (2,000 Units total) by mouth daily. 100 capsule 3  . diclofenac sodium (VOLTAREN) 1 % GEL Apply 1 application topically 4 (four) times daily. (Patient taking differently: Apply 1 application topically 4 (four) times daily as needed. ) 100 g 3  . esomeprazole (NEXIUM) 40 MG capsule Take 1 capsule shortly before breakfast each day. 30 capsule 11  . fluticasone (FLONASE) 50 MCG/ACT nasal spray Place 2 sprays into both nostrils daily. 48 g 3  . loratadine (CLARITIN) 10 MG tablet Take 1 tablet (10 mg total) by mouth daily. (Patient taking differently: Take 10-20 mg by mouth daily. ) 100 tablet 3  . Melatonin 5 MG CAPS Take 5 mg by mouth.    . naproxen sodium (ANAPROX) 220 MG tablet Take 1 tablet (220 mg total) by mouth daily as needed. 60 tablet 6  . Probiotic Product ([employer] ADV PROBIOTIC GUMMIES) CHEW Chew 2 each by mouth at bedtime.    . Probiotic Product (PROBIOTIC PO) Take 500 Units by mouth daily.    . sildenafil (VIAGRA) 25 MG tablet Take 25 mg by mouth daily as needed for erectile dysfunction.    . triamcinolone cream (KENALOG) 0.5 % Apply 1 application topically 3 (three) times daily. 45 g 1  . valsartan-hydrochlorothiazide (DIOVAN-HCT) 320-25 MG tablet Take 1 tablet by mouth daily. 90 tablet 3  . zolpidem (AMBIEN) 10 MG tablet Take 1 tablet (10 mg total) by mouth at bedtime. 90 tablet 1  . predniSONE (DELTASONE) 10 MG tablet     . traMADol (ULTRAM) 50 MG  tablet      No facility-administered medications prior to visit.    ROS: Review of Systems  Constitutional: Negative for appetite change, fatigue and unexpected weight change.  HENT: Negative for congestion, nosebleeds, sneezing, sore throat and trouble swallowing.   Eyes: Negative for itching and visual disturbance.  Respiratory: Negative for cough.   Cardiovascular: Negative for chest pain, palpitations and leg swelling.  Gastrointestinal: Negative for abdominal distention, blood in stool, diarrhea and nausea.  Genitourinary: Negative for frequency and hematuria.  Musculoskeletal: Positive for back pain. Negative for gait problem, joint swelling and neck pain.  Skin: Negative for rash.  Neurological: Negative for dizziness, tremors, speech difficulty and weakness.  Psychiatric/Behavioral: Negative for agitation, dysphoric mood and sleep disturbance. The patient is not nervous/anxious.     Objective:  BP (!) 160/88 (BP Location: Left Arm, Patient Position: Sitting, Cuff Size: Normal)   Pulse 81   Temp 98.2 F (36.8 C) (Oral)   Ht 5\' 8"  (1.727 m)   Wt 173 lb (78.5 kg)   SpO2 98%   BMI 26.30 kg/m   BP Readings from Last 3 Encounters:  11/13/19 (!) 160/88  07/03/19 130/82  05/26/19 138/80    Wt Readings from Last 3 Encounters:  11/13/19 173 lb (78.5 kg)  07/03/19 171 lb (77.6 kg)  05/26/19 175 lb 12.8 oz (79.7 kg)  Physical Exam Constitutional:      General: He is not in acute distress.    Appearance: He is well-developed.     Comments: NAD  Eyes:     Conjunctiva/sclera: Conjunctivae normal.     Pupils: Pupils are equal, round, and reactive to light.  Neck:     Thyroid: No thyromegaly.     Vascular: No JVD.  Cardiovascular:     Rate and Rhythm: Normal rate and regular rhythm.     Heart sounds: Normal heart sounds. No murmur heard.  No friction rub. No gallop.   Pulmonary:     Effort: Pulmonary effort is normal. No respiratory distress.     Breath sounds:  Normal breath sounds. No wheezing or rales.  Chest:     Chest wall: No tenderness.  Abdominal:     General: Bowel sounds are normal. There is no distension.     Palpations: Abdomen is soft. There is no mass.     Tenderness: There is no abdominal tenderness. There is no guarding or rebound.  Musculoskeletal:        General: No tenderness. Normal range of motion.     Cervical back: Normal range of motion.  Lymphadenopathy:     Cervical: No cervical adenopathy.  Skin:    General: Skin is warm and dry.     Findings: No rash.  Neurological:     Mental Status: He is alert and oriented to person, place, and time.     Cranial Nerves: No cranial nerve deficit.     Motor: No abnormal muscle tone.     Coordination: Coordination normal.     Gait: Gait normal.     Deep Tendon Reflexes: Reflexes are normal and symmetric.  Psychiatric:        Behavior: Behavior normal.        Thought Content: Thought content normal.        Judgment: Judgment normal.    Palpable small left thyroid nodule, nontender  Lab Results  Component Value Date   WBC 5.5 07/03/2019   HGB 13.4 07/03/2019   HCT 42.3 07/03/2019   PLT 136.0 (L) 07/03/2019   GLUCOSE 102 (H) 07/03/2019   CHOL 189 07/03/2019   TRIG 76.0 07/03/2019   HDL 44.70 07/03/2019   LDLCALC 129 (H) 07/03/2019   ALT 12 07/03/2019   AST 16 07/03/2019   NA 139 07/03/2019   K 4.1 07/03/2019   CL 101 07/03/2019   CREATININE 1.23 07/03/2019   BUN 21 07/03/2019   CO2 32 07/03/2019   TSH 1.22 07/03/2019   PSA 10.49 (H) 06/27/2017    CT CHEST W CONTRAST  Result Date: 11/04/2019 CLINICAL DATA:  Left flank pain.  Weight loss. EXAM: CT CHEST AND ABDOMEN WITH CONTRAST TECHNIQUE: Multidetector CT imaging of the chest and abdomen was performed following the standard protocol during bolus administration of intravenous contrast. CONTRAST:  136mL ISOVUE-300 IOPAMIDOL (ISOVUE-300) INJECTION 61% COMPARISON:  None. FINDINGS: CT CHEST FINDINGS Cardiovascular: No  acute findings. Mediastinum/Lymph Nodes: No pathologically enlarged lymph nodes identified. 1.6 cm low-attenuation left lobe nodule is seen on image 3/2. Lungs/Pleura: Several tiny noncalcified pulmonary nodules are seen in the anterior right upper and lower lobes, largest measuring 4 mm on image 99/4. Several tiny calcified granulomas are noted in the left lung. No evidence of pulmonary infiltrate or pleural effusion. Musculoskeletal: No suspicious bone lesions or other significant abnormality. CT ABDOMEN FINDINGS Hepatobiliary: No masses identified. Gallbladder is unremarkable. No evidence of biliary ductal dilatation. Pancreas:  No mass or inflammatory changes. Spleen:  Within normal limits in size and appearance. Adrenals/Urinary tract: A solid enhancing mass is seen in the medial upper pole of the left kidney which measures 4.3 x 3.9 cm image 68/2, consistent with renal cell carcinoma. A subcapsular cyst is seen in the lateral midpole the left kidney, but no other renal masses are identified. No evidence of hydronephrosis. Stomach/Bowel: Unremarkable visualized abdominal bowel loops. Vascular/Lymphatic: No pathologically enlarged lymph nodes identified. No evidence renal vein or IVC thrombus. Retroaortic left renal vein incidentally noted. No abdominal aortic aneurysm. Aortic atherosclerosis noted. Other:  None. Musculoskeletal:  No suspicious bone lesions identified. IMPRESSION: 4.3 cm solid enhancing mass in medial upper pole of left kidney, consistent with renal cell carcinoma. No evidence of abdominal metastatic disease. Several tiny indeterminate right lung nodules, largest measuring 4 mm. Consider PET-CT scan for further evaluation. 1.6 cm left thyroid lobe nodule. Recommend thyroid US. (Ref: J Am Coll Radiol. 2015 Feb;12(2): 143-50). Aortic Atherosclerosis (ICD10-I70.0). Electronically Signed   By: Marlaine Hind M.D.   On: 11/04/2019 15:14   CT ABDOMEN W CONTRAST  Result Date: 11/04/2019 CLINICAL  DATA:  Left flank pain.  Weight loss. EXAM: CT CHEST AND ABDOMEN WITH CONTRAST TECHNIQUE: Multidetector CT imaging of the chest and abdomen was performed following the standard protocol during bolus administration of intravenous contrast. CONTRAST:  162mL ISOVUE-300 IOPAMIDOL (ISOVUE-300) INJECTION 61% COMPARISON:  None. FINDINGS: CT CHEST FINDINGS Cardiovascular: No acute findings. Mediastinum/Lymph Nodes: No pathologically enlarged lymph nodes identified. 1.6 cm low-attenuation left lobe nodule is seen on image 3/2. Lungs/Pleura: Several tiny noncalcified pulmonary nodules are seen in the anterior right upper and lower lobes, largest measuring 4 mm on image 99/4. Several tiny calcified granulomas are noted in the left lung. No evidence of pulmonary infiltrate or pleural effusion. Musculoskeletal: No suspicious bone lesions or other significant abnormality. CT ABDOMEN FINDINGS Hepatobiliary: No masses identified. Gallbladder is unremarkable. No evidence of biliary ductal dilatation. Pancreas:  No mass or inflammatory changes. Spleen:  Within normal limits in size and appearance. Adrenals/Urinary tract: A solid enhancing mass is seen in the medial upper pole of the left kidney which measures 4.3 x 3.9 cm image 68/2, consistent with renal cell carcinoma. A subcapsular cyst is seen in the lateral midpole the left kidney, but no other renal masses are identified. No evidence of hydronephrosis. Stomach/Bowel: Unremarkable visualized abdominal bowel loops. Vascular/Lymphatic: No pathologically enlarged lymph nodes identified. No evidence renal vein or IVC thrombus. Retroaortic left renal vein incidentally noted. No abdominal aortic aneurysm. Aortic atherosclerosis noted. Other:  None. Musculoskeletal:  No suspicious bone lesions identified. IMPRESSION: 4.3 cm solid enhancing mass in medial upper pole of left kidney, consistent with renal cell carcinoma. No evidence of abdominal metastatic disease. Several tiny  indeterminate right lung nodules, largest measuring 4 mm. Consider PET-CT scan for further evaluation. 1.6 cm left thyroid lobe nodule. Recommend thyroid US. (Ref: J Am Coll Radiol. 2015 Feb;12(2): 143-50). Aortic Atherosclerosis (ICD10-I70.0). Electronically Signed   By: Marlaine Hind M.D.   On: 11/04/2019 15:14    Assessment & Plan:    Walker Kehr, MD

## 2019-11-13 NOTE — Assessment & Plan Note (Signed)
[place] PET CT Labs

## 2019-11-14 LAB — CBC WITH DIFFERENTIAL/PLATELET
Basophils Absolute: 0.1 10*3/uL (ref 0.0–0.1)
Basophils Relative: 0.8 % (ref 0.0–3.0)
Eosinophils Absolute: 0.1 10*3/uL (ref 0.0–0.7)
Eosinophils Relative: 1.5 % (ref 0.0–5.0)
HCT: 42.3 % (ref 39.0–52.0)
Hemoglobin: 13.3 g/dL (ref 13.0–17.0)
Lymphocytes Relative: 19.5 % (ref 12.0–46.0)
Lymphs Abs: 1.3 10*3/uL (ref 0.7–4.0)
MCHC: 31.4 g/dL (ref 30.0–36.0)
MCV: 59 fl — ABNORMAL LOW (ref 78.0–100.0)
Monocytes Absolute: 0.6 10*3/uL (ref 0.1–1.0)
Monocytes Relative: 8.9 % (ref 3.0–12.0)
Neutro Abs: 4.5 10*3/uL (ref 1.4–7.7)
Neutrophils Relative %: 69.3 % (ref 43.0–77.0)
Platelets: 153 10*3/uL (ref 150.0–400.0)
RBC: 7.16 Mil/uL — ABNORMAL HIGH (ref 4.22–5.81)
RDW: 16.7 % — ABNORMAL HIGH (ref 11.5–15.5)
WBC: 6.6 10*3/uL (ref 4.0–10.5)

## 2019-11-21 ENCOUNTER — Other Ambulatory Visit: Payer: Self-pay

## 2019-11-21 ENCOUNTER — Ambulatory Visit (HOSPITAL_COMMUNITY)
Admission: RE | Admit: 2019-11-21 | Discharge: 2019-11-21 | Disposition: A | Discharge status: Home / Self Care | Payer: Medicare Other | Source: Ambulatory Visit | Attending: Internal Medicine | Admitting: Internal Medicine

## 2019-11-21 DIAGNOSIS — I700 Atherosclerosis of aorta: Secondary | ICD-10-CM | POA: Diagnosis not present

## 2019-11-21 DIAGNOSIS — E041 Nontoxic single thyroid nodule: Secondary | ICD-10-CM | POA: Insufficient documentation

## 2019-11-21 DIAGNOSIS — C642 Malignant neoplasm of left kidney, except renal pelvis: Secondary | ICD-10-CM | POA: Diagnosis not present

## 2019-11-21 DIAGNOSIS — M47816 Spondylosis without myelopathy or radiculopathy, lumbar region: Secondary | ICD-10-CM | POA: Diagnosis not present

## 2019-11-21 DIAGNOSIS — I2510 Atherosclerotic heart disease of native coronary artery without angina pectoris: Secondary | ICD-10-CM | POA: Diagnosis not present

## 2019-11-21 DIAGNOSIS — R918 Other nonspecific abnormal finding of lung field: Secondary | ICD-10-CM | POA: Diagnosis not present

## 2019-11-21 DIAGNOSIS — N2889 Other specified disorders of kidney and ureter: Secondary | ICD-10-CM | POA: Diagnosis not present

## 2019-11-21 LAB — GLUCOSE, CAPILLARY: Glucose-Capillary: 92 mg/dL (ref 70–99)

## 2019-11-21 MED ORDER — FLUDEOXYGLUCOSE F - 18 (FDG) INJECTION
9.4000 | Freq: Once | INTRAVENOUS | Status: AC | PRN
  Administered 2019-11-21: 9.4 via INTRAVENOUS

## 2019-11-26 ENCOUNTER — Ambulatory Visit: Payer: Medicare Other | Admitting: Internal Medicine

## 2019-12-02 DIAGNOSIS — D49512 Neoplasm of unspecified behavior of left kidney: Secondary | ICD-10-CM | POA: Diagnosis not present

## 2019-12-05 ENCOUNTER — Other Ambulatory Visit: Payer: Self-pay | Admitting: Urology

## 2019-12-05 ENCOUNTER — Other Ambulatory Visit (HOSPITAL_COMMUNITY): Payer: Self-pay | Admitting: Urology

## 2019-12-05 DIAGNOSIS — D49512 Neoplasm of unspecified behavior of left kidney: Secondary | ICD-10-CM

## 2019-12-12 ENCOUNTER — Ambulatory Visit (HOSPITAL_COMMUNITY)
Admission: RE | Admit: 2019-12-12 | Discharge: 2019-12-12 | Disposition: A | Discharge status: Home / Self Care | Payer: Medicare Other | Source: Ambulatory Visit | Attending: Urology | Admitting: Urology

## 2019-12-12 ENCOUNTER — Other Ambulatory Visit: Payer: Self-pay

## 2019-12-12 DIAGNOSIS — N281 Cyst of kidney, acquired: Secondary | ICD-10-CM | POA: Diagnosis not present

## 2019-12-12 DIAGNOSIS — D49512 Neoplasm of unspecified behavior of left kidney: Secondary | ICD-10-CM | POA: Insufficient documentation

## 2019-12-12 DIAGNOSIS — C642 Malignant neoplasm of left kidney, except renal pelvis: Secondary | ICD-10-CM | POA: Diagnosis not present

## 2019-12-12 DIAGNOSIS — D1803 Hemangioma of intra-abdominal structures: Secondary | ICD-10-CM | POA: Diagnosis not present

## 2019-12-12 DIAGNOSIS — N2889 Other specified disorders of kidney and ureter: Secondary | ICD-10-CM | POA: Diagnosis not present

## 2019-12-12 MED ORDER — GADOBUTROL 1 MMOL/ML IV SOLN
7.5000 mL | Freq: Once | INTRAVENOUS | Status: AC | PRN
  Administered 2019-12-12: 7.5 mL via INTRAVENOUS

## 2019-12-12 NOTE — Progress Notes (Signed)
DUE TO COVID-19 ONLY ONE VISITOR IS ALLOWED TO COME WITH YOU AND STAY IN THE WAITING ROOM ONLY DURING PRE OP AND PROCEDURE DAY OF SURGERY. THE 1 VISITOR  MAY VISIT WITH YOU AFTER SURGERY IN YOUR PRIVATE ROOM DURING VISITING HOURS ONLY!  YOU NEED TO HAVE A COVID 19 TEST ON_11/12/2019 ______ @_______ , THIS TEST MUST BE DONE BEFORE SURGERY,  COVID TESTING SITE [address] [place] [address], IT IS ON THE RIGHT GOING OUT [address] APPROXIMATELY  2 MINUTES PAST [employer] ON THE RIGHT. ONCE YOUR COVID TEST IS COMPLETED,  PLEASE BEGIN THE QUARANTINE INSTRUCTIONS AS OUTLINED IN YOUR HANDOUT.                LATOYA DISKIN  12/12/2019   Your procedure is scheduled on: 12/22/2019   Report to [facility] Main  Entrance   Report to admitting at   [place] AM     Call this number if you have problems the morning of surgery [phone]    Remember: Do not eat food , candy gum or mints :After Midnight. You may have clear liquids from midnight until 0415am  Magnesium Citrate - 8 ounces at 12 noon day before surgery.   CLEAR LIQUID DIET   Foods Allowed                                                                       Coffee and tea, regular and decaf                              Plain Jell-O any favor except red or purple                                            Fruit ices (not with fruit pulp)                                      Iced Popsicles                                     Carbonated beverages, regular and diet                                    Cranberry, grape and apple juices Sports drinks like Gatorade Lightly seasoned clear broth or consume(fat free) Sugar, honey syrup   _____________________________________________________________________    BRUSH YOUR TEETH MORNING OF SURGERY AND RINSE YOUR MOUTH OUT, NO CHEWING GUM CANDY OR MINTS.     Take these medicines the morning of surgery with A SIP OF WATER:  Nexium, Flonase                                  You may not have any metal on your  body including hair pins and              piercings  Do not wear jewelry, make-up, lotions, powders or perfumes, deodorant             Do not wear nail polish on your fingernails.  Do not shave  48 hours prior to surgery.              Men may shave face and neck.   Do not bring valuables to the hospital. [place].  Contacts, dentures or bridgework may not be worn into surgery.  Leave suitcase in the car. After surgery it may be brought to your room.     Patients discharged the day of surgery will not be allowed to drive home. IF YOU ARE HAVING SURGERY AND GOING HOME THE SAME DAY, YOU MUST HAVE AN ADULT TO DRIVE YOU HOME AND BE WITH YOU FOR 24 HOURS. YOU MAY GO HOME BY TAXI OR UBER OR ORTHERWISE, BUT AN ADULT MUST ACCOMPANY YOU HOME AND STAY WITH YOU FOR 24 HOURS.  Name and phone number of your driver:  Special Instructions: N/A              Please read over the following fact sheets you were given: _____________________________________________________________________  [facility] - Preparing for Surgery Before surgery, you can play an important role.  Because skin is not sterile, your skin needs to be as free of germs as possible.  You can reduce the number of germs on your skin by washing with CHG (chlorahexidine gluconate) soap before surgery.  CHG is an antiseptic cleaner which kills germs and bonds with the skin to continue killing germs even after washing. Please DO NOT use if you have an allergy to CHG or antibacterial soaps.  If your skin becomes reddened/irritated stop using the CHG and inform your nurse when you arrive at Short Stay. Do not shave (including legs and underarms) for at least 48 hours prior to the first CHG shower.  You may shave your face/neck. Please follow these instructions carefully:  1.  Shower with CHG Soap the night before surgery and the  morning of Surgery.  2.   If you choose to wash your hair, wash your hair first as usual with your  normal  shampoo.  3.  After you shampoo, rinse your hair and body thoroughly to remove the  shampoo.                           4.  Use CHG as you would any other liquid soap.  You can apply chg directly  to the skin and wash                       Gently with a scrungie or clean washcloth.  5.  Apply the CHG Soap to your body ONLY FROM THE NECK DOWN.   Do not use on face/ open                           Wound or open sores. Avoid contact with eyes, ears mouth and genitals (private parts).                       Wash face,  Development worker, international aid (private  parts) with your normal soap.             6.  Wash thoroughly, paying special attention to the area where your surgery  will be performed.  7.  Thoroughly rinse your body with warm water from the neck down.  8.  DO NOT shower/wash with your normal soap after using and rinsing off  the CHG Soap.                9.  Pat yourself dry with a clean towel.            10.  Wear clean pajamas.            11.  Place clean sheets on your bed the night of your first shower and do not  sleep with pets. Day of Surgery : Do not apply any lotions/deodorants the morning of surgery.  Please wear clean clothes to the hospital/surgery center.  FAILURE TO FOLLOW THESE INSTRUCTIONS MAY RESULT IN THE CANCELLATION OF YOUR SURGERY PATIENT SIGNATURE_________________________________  NURSE SIGNATURE__________________________________  ________________________________________________________________________

## 2019-12-15 ENCOUNTER — Encounter (HOSPITAL_COMMUNITY): Payer: Self-pay

## 2019-12-15 ENCOUNTER — Other Ambulatory Visit: Payer: Self-pay

## 2019-12-15 ENCOUNTER — Encounter (HOSPITAL_COMMUNITY)
Admission: RE | Admit: 2019-12-15 | Discharge: 2019-12-15 | Disposition: A | Discharge status: Home / Self Care | Payer: Medicare Other | Source: Ambulatory Visit | Attending: Urology | Admitting: Urology

## 2019-12-15 DIAGNOSIS — Z01818 Encounter for other preprocedural examination: Secondary | ICD-10-CM | POA: Diagnosis not present

## 2019-12-15 HISTORY — DX: Malignant (primary) neoplasm, unspecified: C80.1

## 2019-12-15 HISTORY — DX: Gastro-esophageal reflux disease without esophagitis: K21.9

## 2019-12-15 LAB — BASIC METABOLIC PANEL
Anion gap: 9 (ref 5–15)
BUN: 12 mg/dL (ref 8–23)
CO2: 27 mmol/L (ref 22–32)
Calcium: 9 mg/dL (ref 8.9–10.3)
Chloride: 104 mmol/L (ref 98–111)
Creatinine, Ser: 1.13 mg/dL (ref 0.61–1.24)
GFR, Estimated: >60 mL/min (ref >60)
Glucose, Bld: 137 mg/dL — ABNORMAL HIGH (ref 70–99)
Potassium: 3.6 mmol/L (ref 3.5–5.1)
Sodium: 140 mmol/L (ref 135–145)

## 2019-12-15 LAB — CBC
HCT: 42.7 % (ref 39.0–52.0)
Hemoglobin: 13 g/dL (ref 13.0–17.0)
MCH: 18.5 pg — ABNORMAL LOW (ref 26.0–34.0)
MCHC: 30.4 g/dL (ref 30.0–36.0)
MCV: 60.7 fL — ABNORMAL LOW (ref 80.0–100.0)
Platelets: 155 10*3/uL (ref 150–400)
RBC: 7.03 MIL/uL — ABNORMAL HIGH (ref 4.22–5.81)
RDW: 18.6 % — ABNORMAL HIGH (ref 11.5–15.5)
WBC: 6 10*3/uL (ref 4.0–10.5)
nRBC: 0 % (ref 0.0–0.2)

## 2019-12-15 NOTE — Progress Notes (Signed)
CBC result done 12/15/19 faxed via epic to dr Alinda Money.  Roanna Banning made aware of CBC result.  No new orders given.

## 2019-12-18 ENCOUNTER — Other Ambulatory Visit (HOSPITAL_COMMUNITY)
Admission: RE | Admit: 2019-12-18 | Discharge: 2019-12-18 | Disposition: A | Discharge status: Home / Self Care | Payer: Medicare Other | Source: Ambulatory Visit | Attending: Urology | Admitting: Urology

## 2019-12-18 DIAGNOSIS — Z01812 Encounter for preprocedural laboratory examination: Secondary | ICD-10-CM | POA: Insufficient documentation

## 2019-12-18 DIAGNOSIS — Z20822 Contact with and (suspected) exposure to covid-19: Secondary | ICD-10-CM | POA: Diagnosis not present

## 2019-12-18 LAB — SARS CORONAVIRUS 2 (TAT 6-24 HRS): SARS Coronavirus 2: NEGATIVE

## 2019-12-19 NOTE — H&P (Signed)
Office Visit Report     12/02/2019   --------------------------------------------------------------------------------   Chad Palmer  MRN: [id number]  DOB: 18-Apr-1945, 74 year old Male  SSN: [id number]   PRIMARY CARE:  Aleksei V. Plotnikov, MD  REFERRING:  Ammie Dalton, NP  PROVIDER:  Franchot Gallo, M.D.  TREATING:  Raynelle Bring, M.D.  LOCATION:  [facility] [phone]     --------------------------------------------------------------------------------   CC/HPI: CC: Left renal neoplasm  Physician requesting consult: Dr. Walker Kehr  Primary care physician: Dr. Walker Kehr   Chad Palmer is a 74 year old gentleman who developed the acute onset of left lower back pain with radiation to his left flank approximately 1 month ago. This occurred after some strenuous activity. Due to persistence of his pain, he presented to his orthopedic surgeon, Dr. Lindwood Qua. He underwent imaging with a CT scan of the chest, abdomen, and pelvis with contrast on 11/04/19. Incidentally, this did reveal a 4.3 cm hyperdense and heterogeneous lesion of the posterior medial aspect of the left kidney concerning for possible renal cell carcinoma. There was no evidence of any regional lymphadenopathy or obvious renal vein involvement. He did have small nonspecific 4 mm pulmonary nodules. He was seen by his primary care physician, Dr. Alain Marion, and underwent a PET scan. This did not reveal any findings that were highly concerning for metastatic disease to the lung. However, there was concern about a thyroid lesion prompting an ultrasound of the thyroid. Fortunately, this lesion was not highly concerning for malignancy and was felt to be benign. He has denied any hematuria. He has no family history of kidney cancer and no family history of end-stage kidney disease. His baseline serum creatinine is 1.1. Liver function tests were normal. His labs drawn on 11/13/2019.   He has been followed for  an elevated PSA by Dr. Diona Fanti. He was referred to see me today regarding his kidney mass considering the likelihood that he would require minimally invasive surgical therapy.   Family history of kidney cancer: No  Family history of ESRD: No   Imaging: 11/04/19  Side of renal neoplasm: Left  Size of renal neoplasm: 4.3 cm  Location of renal neoplasm: Medial posterior hilar  Exophytic or endophytic: Endophytic  Renal nephrometry score: 10ph   Renal artery anatomy: Single renal artery  Renal vein anatomy: Single RETROAORTIC renal vein   Contralateral renal lesions: None.  Regional lymphadenopathy: None.  Adrenal masses: None.  Renal vein/IVC involvement: No.  Metastatic disease to the abdomen: No.   Chest imaging: CT chest 11/04/19 - nonspecific small pulmonary nodules  LFTs: Normal.   Baseline renal function: Cr 1.1, eGFR > 60 ml/min   PMH: Past medical history is significant for hypertension and GERD.  PSH: No abdominal surgeries.     ALLERGIES: Levofloxacin    MEDICATIONS: Aleve  Claritin  Diovan HCT 160-12.5 MG Oral Tablet Oral  Esomeprazole Magnesium 20 mg capsule,delayed release  Fluticasone Propionate 50 MCG/ACT Nasal Suspension Nasal  Melatonin  Metamucil  Sildenafil Citrate 100 mg tablet 1 tablet PO PRN  Vitamin D3  Zinc  Zolpidem Tartrate 10 mg tablet Oral     GU PSH: Vasectomy - 2008       [place] Notes: Cataract Surgery, Hemorrhoidectomy, Surgery Of Male Genitalia Vasectomy, Tonsillectomy   NON-GU PSH: Colonoscopy Hemorrhoidectomy (favorite) - 2008 Remove Tonsils - 2008     GU PMH: BPH w/LUTS - 10/01/2019, Worsening urinary sx's over the last 7-8 mo's. He is tolerating this well enough  currently but is somewhat interested in medicall management for this. , - 02/26/2019 (Worsening), Minimal urinary symptomatology, although urgency is getting a bit worse, - 01/23/2018 (Stable), Quite a large gland, benign feeling. Minimal urinary symptomatology, not on  medical therapy., 2016-04-25 (Stable), Minimal/tolerable urinary symptomatology, - 2015-04-26 Elevated PSA - 10/01/2019, Due repeat PSA., - 02/26/2019, Biopsy x4, last in 2011-04-26. Overall, PSA curve is minimally increasing, not worrisome , - 01/23/2018 (Stable), He has had 4 prostate biopsies, last in 2011-04-26. PSA is up slightly, but not significantly out of his normal range. Exam today benign., 2016/04/25 (Stable), PSA has been stable-last checked in 04-26-14, 7.5 with a high free PSA consistent with benign elevation, - April 26, 2015, Elevated prostate specific antigen (PSA), - 26-Apr-2014 Urinary Urgency, This is his largest complaint - more than likely due to enlargement of his prostate. Given OAB guidesheet to try some behavioral changes to alleviate some of his LUTS secondary to BPH. - 02/26/2019 BPH w/o LUTS, Benign localized hyperplasia of prostate - 2014-04-26 ED due to arterial insufficiency, Erectile dysfunction due to arterial insufficiency - 04-26-2014    NON-GU PMH: Encounter for general adult medical examination without abnormal findings, Encounter for preventive health examination - Apr 25, 2013 Personal history of other diseases of the circulatory system, History of essential hypertension - 2012/04/25    FAMILY HISTORY: Death In The Family Mother - Runs In Family Family Health Status Number - Runs In Family   SOCIAL HISTORY: No Social History     Notes: Never A Smoker, Alcohol Use, Occupation:, Caffeine Use, Marital History - Currently Married   REVIEW OF SYSTEMS:    GU Review Male:   Patient denies frequent urination, hard to postpone urination, burning/ pain with urination, get up at night to urinate, leakage of urine, stream starts and stops, trouble starting your streams, and have to strain to urinate .  Gastrointestinal (Lower):   Patient denies diarrhea and constipation.  Gastrointestinal (Upper):   Patient denies nausea and vomiting.  Constitutional:   Patient denies fever, night sweats, weight loss, and fatigue.  Skin:   Patient denies  skin rash/ lesion and itching.  Eyes:   Patient denies blurred vision and double vision.  Ears/ Nose/ Throat:   Patient denies sore throat and sinus problems.  Hematologic/Lymphatic:   Patient denies swollen glands and easy bruising.  Cardiovascular:   Patient denies leg swelling and chest pains.  Respiratory:   Patient denies cough and shortness of breath.  Endocrine:   Patient denies excessive thirst.  Musculoskeletal:   Patient denies back pain and joint pain.  Neurological:   Patient denies headaches and dizziness.  Psychologic:   Patient denies depression and anxiety.   VITAL SIGNS:      12/02/2019 07:51 AM  Weight 170 lb / 77.11 kg  Height 68 in / 172.72 cm  BP 142/82 mmHg  Pulse 75 /min  Temperature 97.1 F / 36.1 C  BMI 25.8 kg/m   MULTI-SYSTEM PHYSICAL EXAMINATION:    Constitutional: Well-nourished. No physical deformities. Normally developed. Good grooming.  Neck: Neck symmetrical, not swollen. Normal tracheal position.  Respiratory: No labored breathing, no use of accessory muscles. Clear bilaterally  Cardiovascular: Normal temperature, normal extremity pulses, no swelling, no varicosities. Regular rate and rhythm.   Lymphatic: No enlargement of neck, axillae, groin.  Skin: No paleness, no jaundice, no cyanosis. No lesion, no ulcer, no rash.  Neurologic / Psychiatric: Oriented to time, oriented to place, oriented to person. No depression, no anxiety, no agitation.  Gastrointestinal:  No mass, no tenderness, no rigidity, non obese abdomen.   Eyes: Normal conjunctivae. Normal eyelids.  Ears, Nose, Mouth, and Throat: Left ear no scars, no lesions, no masses. Right ear no scars, no lesions, no masses. Nose no scars, no lesions, no masses. Normal hearing. Normal lips.  Musculoskeletal: Normal gait and station of head and neck.     Complexity of Data:  Lab Test Review:   CMP  X-Ray Review: PET Scan: Reviewed Films.  C.T. Chest/ Abd/Pelvis: Reviewed Films.     10/01/19  02/27/19 01/23/18 06/27/17 12/27/16 12/29/15 12/29/14 12/10/13  PSA  Total PSA 8.15 ng/mL 12.00 ng/mL 10.30 ng/mL 10.49 ng/dl 9.61 ng/mL 7.26 ng/dl 7.51  7.86   Free PSA       2.25  2.27   % Free PSA       30  29    Notes:                     CLINICAL DATA: Left flank pain. Weight loss.   EXAM:  CT CHEST AND ABDOMEN WITH CONTRAST   TECHNIQUE:  Multidetector CT imaging of the chest and abdomen was performed  following the standard protocol during bolus administration of  intravenous contrast.   CONTRAST: 157m ISOVUE-300 IOPAMIDOL (ISOVUE-300) INJECTION 61%   COMPARISON: None.   FINDINGS:  CT CHEST FINDINGS   Cardiovascular: No acute findings.   Mediastinum/Lymph Nodes: No pathologically enlarged lymph nodes  identified. 1.6 cm low-attenuation left lobe nodule is seen on image  3/2.   Lungs/Pleura: Several tiny noncalcified pulmonary nodules are seen  in the anterior right upper and lower lobes, largest measuring 4 mm  on image 99/4. Several tiny calcified granulomas are noted in the  left lung. No evidence of pulmonary infiltrate or pleural effusion.   Musculoskeletal: No suspicious bone lesions or other significant  abnormality.   CT ABDOMEN FINDINGS   Hepatobiliary: No masses identified. Gallbladder is unremarkable. No  evidence of biliary ductal dilatation.   Pancreas: No mass or inflammatory changes.   Spleen: Within normal limits in size and appearance.   Adrenals/Urinary tract: A solid enhancing mass is seen in the medial  upper pole of the left kidney which measures 4.3 x 3.9 cm image  68/2, consistent with renal cell carcinoma. A subcapsular cyst is  seen in the lateral midpole the left kidney, but no other renal  masses are identified. No evidence of hydronephrosis.   Stomach/Bowel: Unremarkable visualized abdominal bowel loops.   Vascular/Lymphatic: No pathologically enlarged lymph nodes  identified. No evidence renal vein or IVC thrombus. Retroaortic  left  renal vein incidentally noted. No abdominal aortic aneurysm. Aortic  atherosclerosis noted.   Other: None.   Musculoskeletal: No suspicious bone lesions identified.   IMPRESSION:  4.3 cm solid enhancing mass in medial upper pole of left kidney,  consistent with renal cell carcinoma.   No evidence of abdominal metastatic disease.   Several tiny indeterminate right lung nodules, largest measuring 4  mm. Consider PET-CT scan for further evaluation.   1.6 cm left thyroid lobe nodule. Recommend thyroid [place] (Ref: J Am  Coll Radiol. 2015 Feb;12(2): 143-50).   Aortic Atherosclerosis (ICD10-I70.0).    Electronically Signed  By: JMarlaine HindM.D.  On: 11/04/2019 15:14   CLINICAL DATA: Abdominal pain with weight loss     EXAM:  ABDOMEN ULTRASOUND COMPLETE     COMPARISON: None.     FINDINGS:  Gallbladder: Within the gallbladder, there are small echogenic foci  which move and shadow consistent with cholelithiasis. Largest  gallstone measures 3 mm. No gallbladder wall thickening or  pericholecystic fluid. No sonographic Murphy sign noted by  sonographer.     Common bile duct: Diameter: 2 mm. No intrahepatic, common hepatic,  or common bile duct dilatation.     Liver: No focal lesion identified. Within normal limits in  parenchymal echogenicity. Portal vein is patent on color Doppler  imaging with normal direction of blood flow towards the liver.     IVC: No abnormality visualized in regions which can be interrogated.  Most of the infrahepatic inferior vena cava is obscured by gas.     Pancreas: Pancreas essentially completely obscured by gas.     Spleen: Size and appearance within normal limits.     Right Kidney: Length: 11.2 cm. Echogenicity within normal limits. No  mass or hydronephrosis visualized.     Left Kidney: Length: 12.9 cm. Echogenicity within normal limits. No  mass or hydronephrosis visualized.     Abdominal aorta: No aneurysm visualized.     Other  findings: No evident ascites.     IMPRESSION:  1. Cholelithiasis. No gallbladder wall thickening or pericholecystic  fluid.     2. Pancreas and most of the inferior vena cava obscured by gas.     3. Study otherwise unremarkable.        Electronically Signed  By: Lowella Grip III M.D.  On: 07/21/2019 11:56   Study Result   Narrative & Impression  CLINICAL DATA: Initial treatment strategy for LEFT renal mass.     EXAM:  NUCLEAR MEDICINE PET SKULL BASE TO THIGH     TECHNIQUE:  9.1 mCi F-18 FDG was injected intravenously. Full-ring PET imaging  was performed from the skull base to thigh after the radiotracer. CT  data was obtained and used for attenuation correction and anatomic  localization.     Fasting blood glucose: 92 mg/dl     COMPARISON: CT chest and abdomen of 11/04/2019     FINDINGS:  Mediastinal blood pool activity: SUV max 2.64     Liver activity: SUV max none     NECK: Small lymph node on image 40 of series 4 and image 41 of  series 4 this measures 7 mm with a maximum SUV of 4.3 no other signs  of increased metabolic activity in the neck outside of cervical  spine degenerative changes showing mild FDG uptake due to large  anterior osteophytes.     Incidental CT findings: none     CHEST: No hypermetabolic mediastinal or hilar nodes. No suspicious  pulmonary nodules on the CT scan. The tiny nodules that were  discussed on the previous CT of the chest do not display increased  metabolic activity either below the resolution of PET or affected by  limitations of PET with respect to assessment of renal neoplasm.     Incidental CT findings: Tiny pulmonary nodules are unchanged,  largest in the RIGHT upper lobe on image 15 of series 8 measuring  approximately 4 mm. Juxtapleural nodule in the RIGHT chest on image  99 also unchanged. Calcified nodule in the LEFT chest as before.     Calcified coronary artery disease. Calcified atheromatous plaque in  the  thoracic aorta. No pericardial effusion. Limited assessment of  cardiovascular structures given lack of intravenous contrast.     ABDOMEN/PELVIS: The LEFT renal mass does not display increased FDG  uptake. Unchanged compared to the recent CT evaluation measuring  approximately 4.2 cm  greatest axial dimension with a maximum SUV of  approximately 4.2 no additional foci of increased metabolic activity  in the abdomen.     Incidental CT findings: Aortic atherosclerosis as on the recent CT  evaluation. Retroaortic LEFT renal vein. No acute process in the  abdomen. Colonic diverticulosis. Marked prostatomegaly. Post  vasectomy.     SKELETON: Spinal degenerative changes. Hypertrophic osteophyte  extending into the LEFT retroperitoneum with increased metabolic  activity, no activity in the skeleton indicative of metastatic  disease though assessment of renal neoplasm and metastasis on PET is  limited     Incidental CT findings: Marked spinal degenerative changes as  alluded to above.     IMPRESSION:  1. Mild hypermetabolism associated with the known renal neoplasm,  remaining suspicious for renal cell carcinoma.  2. Tiny pulmonary nodules remain nonspecific, potentially benign.  Based on size in the 4 mm range would suggest attention on  subsequent imaging to assess for any changes.  3. LEFT neck lymph node just above the thyroid with mildly increased  metabolic activity, consider focused ultrasound of this area. This  would be an unusual location for metastatic renal cell carcinoma and  this may represent a reactive lymph node.  4. Marked spinal degenerative changes most notably at the L2-3  level.  5. Aortic atherosclerosis.     Aortic Atherosclerosis (ICD10-I70.0).        Electronically Signed  By: Zetta Bills M.D.  On: 11/21/2019 16:21         PROCEDURES:          Urinalysis Dipstick Dipstick Cont'd  Color: Yellow Bilirubin: Neg mg/dL  Appearance: Clear Ketones: Neg  mg/dL  Specific Gravity: 1.020 Blood: Neg ery/uL  pH: 6.5 Protein: Trace mg/dL  Glucose: Neg mg/dL Urobilinogen: 0.2 mg/dL    Nitrites: Neg    Leukocyte Esterase: Neg leu/uL    ASSESSMENT:      ICD-10 Details  1 GU:   Left renal neoplasm - D49.512    PLAN:           Schedule Return Visit/Planned Activity: Other See Visit Notes             Note: Will call to schedule surgery.          Document Letter(s):  Created for Patient: Clinical Summary         Notes:   1. Left renal neoplasm concerning for malignancy: I had a detailed discussion with Chad Palmer and his wife today regarding his imaging findings that are highly suspicious for a localized renal cell carcinoma. He does not appear to have evidence of metastatic disease after undergoing additional imaging. As his imaging was performed only with contrast, I have recommended that he proceed with an MRI with and without contrast for further definitive evaluation. However, based on the suspicion that this does represent a localized renal cell carcinoma, we did discuss therapeutic options today.   The patient was provided information regarding their renal mass including the relative risk of benign versus malignant pathology and the natural history of renal cell carcinoma and other possible malignancies of the kidney. The role of renal biopsy, laboratory testing, and imaging studies to further characterize renal masses and/or the presence of metastatic disease were explained. We discussed the role of active surveillance, surgical therapy with both radical nephrectomy and nephron-sparing surgery, and ablative therapy in the treatment of renal masses. In addition, we discussed our goals of providing an accurate diagnosis and oncologic control while maintaining optimal  renal function as appropriate based on the size, location, and complexity of their renal mass as well as their co-morbidities. We have discussed the risks of treatment in detail  including but not limited to bleeding, infection, heart attack, stroke, death, venothromoboembolism, cancer recurrence, injury/damage to surrounding organs and structures, urine leak, the possibility of open surgical conversion for patients undergoing minimally invasive surgery, the risk of developing chronic kidney disease and its associated implications, and the potential risk of end stage renal disease possibly necessitating dialysis.   He will tentatively be scheduled for a left laparoscopic radical nephrectomy. Considering the location of his tumor, this is not amenable to nephron sparing surgery. I will notify me of his MRI results after review.   Cc: Dr. Cristie Hem Plotnikov  Dr. Franchot Gallo  Dr. Lindwood Qua     E & M CODES: We spent 52 minutes dedicated to evaluation and management time, including face to face interaction, discussions on coordination of care, documentation, result review, and discussion with others as applicable.     * Signed by Raynelle Bring, M.D. on 12/02/19 at 9:25 PM (EDT)*       APPENDED NOTES:  MRI result confirm an enhancing renal tumor of the left kidney. Will plan to proceed as discussed. The patient has been notified.     * Signed by Raynelle Bring, M.D. on 12/12/19 at 4:44 PM (EDT)*

## 2019-12-22 ENCOUNTER — Inpatient Hospital Stay (HOSPITAL_COMMUNITY)
Admission: RE | Admit: 2019-12-22 | Discharge: 2019-12-24 | DRG: 657 | Disposition: A | Discharge status: Home / Self Care | Payer: Medicare Other | Attending: Urology | Admitting: Urology

## 2019-12-22 ENCOUNTER — Other Ambulatory Visit: Payer: Self-pay

## 2019-12-22 ENCOUNTER — Encounter (HOSPITAL_COMMUNITY)
Admission: RE | Disposition: A | Discharge status: Home / Self Care | Payer: Self-pay | Source: Home / Self Care | Attending: Urology

## 2019-12-22 ENCOUNTER — Encounter (HOSPITAL_COMMUNITY): Payer: Self-pay | Admitting: Urology

## 2019-12-22 ENCOUNTER — Inpatient Hospital Stay (HOSPITAL_COMMUNITY): Payer: Medicare Other | Admitting: Physician Assistant

## 2019-12-22 ENCOUNTER — Inpatient Hospital Stay (HOSPITAL_COMMUNITY): Payer: Medicare Other | Admitting: Anesthesiology

## 2019-12-22 DIAGNOSIS — Z9852 Vasectomy status: Secondary | ICD-10-CM

## 2019-12-22 DIAGNOSIS — K8020 Calculus of gallbladder without cholecystitis without obstruction: Secondary | ICD-10-CM | POA: Diagnosis not present

## 2019-12-22 DIAGNOSIS — E041 Nontoxic single thyroid nodule: Secondary | ICD-10-CM | POA: Diagnosis not present

## 2019-12-22 DIAGNOSIS — T884XXA Failed or difficult intubation, initial encounter: Secondary | ICD-10-CM | POA: Clinically undetermined

## 2019-12-22 DIAGNOSIS — I700 Atherosclerosis of aorta: Secondary | ICD-10-CM | POA: Diagnosis not present

## 2019-12-22 DIAGNOSIS — K567 Ileus, unspecified: Secondary | ICD-10-CM | POA: Diagnosis not present

## 2019-12-22 DIAGNOSIS — I10 Essential (primary) hypertension: Secondary | ICD-10-CM | POA: Diagnosis present

## 2019-12-22 DIAGNOSIS — Z79899 Other long term (current) drug therapy: Secondary | ICD-10-CM

## 2019-12-22 DIAGNOSIS — C642 Malignant neoplasm of left kidney, except renal pelvis: Principal | ICD-10-CM | POA: Diagnosis present

## 2019-12-22 DIAGNOSIS — K219 Gastro-esophageal reflux disease without esophagitis: Secondary | ICD-10-CM | POA: Diagnosis present

## 2019-12-22 DIAGNOSIS — D49512 Neoplasm of unspecified behavior of left kidney: Secondary | ICD-10-CM | POA: Diagnosis not present

## 2019-12-22 DIAGNOSIS — K9189 Other postprocedural complications and disorders of digestive system: Secondary | ICD-10-CM | POA: Diagnosis not present

## 2019-12-22 DIAGNOSIS — M1990 Unspecified osteoarthritis, unspecified site: Secondary | ICD-10-CM | POA: Diagnosis not present

## 2019-12-22 HISTORY — PX: LAPAROSCOPIC NEPHRECTOMY: SHX1930

## 2019-12-22 LAB — TYPE AND SCREEN
ABO/RH(D): O POS
Antibody Screen: NEGATIVE

## 2019-12-22 LAB — BASIC METABOLIC PANEL
Anion gap: 10 (ref 5–15)
BUN: 12 mg/dL (ref 8–23)
CO2: 26 mmol/L (ref 22–32)
Calcium: 8.7 mg/dL — ABNORMAL LOW (ref 8.9–10.3)
Chloride: 101 mmol/L (ref 98–111)
Creatinine, Ser: 1.24 mg/dL (ref 0.61–1.24)
GFR, Estimated: >60 mL/min (ref >60)
Glucose, Bld: 137 mg/dL — ABNORMAL HIGH (ref 70–99)
Potassium: 4.1 mmol/L (ref 3.5–5.1)
Sodium: 137 mmol/L (ref 135–145)

## 2019-12-22 LAB — HEMOGLOBIN AND HEMATOCRIT, BLOOD
HCT: 41.8 % (ref 39.0–52.0)
Hemoglobin: 12.6 g/dL — ABNORMAL LOW (ref 13.0–17.0)

## 2019-12-22 LAB — ABO/RH: ABO/RH(D): O POS

## 2019-12-22 SURGERY — NEPHRECTOMY, RADICAL, LAPAROSCOPIC, ADULT
Anesthesia: General | Laterality: Left

## 2019-12-22 MED ORDER — CHLORHEXIDINE GLUCONATE 0.12 % MT SOLN
15.0000 mL | Freq: Once | OROMUCOSAL | Status: AC
  Administered 2019-12-22: 15 mL via OROMUCOSAL

## 2019-12-22 MED ORDER — LIDOCAINE 2% (20 MG/ML) 5 ML SYRINGE
INTRAMUSCULAR | Status: DC | PRN
  Administered 2019-12-22: 60 mg via INTRAVENOUS

## 2019-12-22 MED ORDER — AMISULPRIDE (ANTIEMETIC) 5 MG/2ML IV SOLN: 10.0000 mg | Freq: Once | INTRAVENOUS | Status: DC | PRN

## 2019-12-22 MED ORDER — DEXTROSE-NACL 5-0.45 % IV SOLN: INTRAVENOUS | Status: DC

## 2019-12-22 MED ORDER — DOCUSATE SODIUM 100 MG PO CAPS
100.0000 mg | ORAL_CAPSULE | Freq: Two times a day (BID) | ORAL | Status: DC
  Administered 2019-12-22 – 2019-12-24 (×4): 100 mg via ORAL
  Filled 2019-12-22 (×4): qty 1

## 2019-12-22 MED ORDER — LACTATED RINGERS IV SOLN: INTRAVENOUS | Status: DC

## 2019-12-22 MED ORDER — ONDANSETRON HCL 4 MG/2ML IJ SOLN: 4.0000 mg | INTRAMUSCULAR | Status: DC | PRN

## 2019-12-22 MED ORDER — ORAL CARE MOUTH RINSE: 15.0000 mL | Freq: Once | OROMUCOSAL | Status: AC

## 2019-12-22 MED ORDER — LIDOCAINE 2% (20 MG/ML) 5 ML SYRINGE
INTRAMUSCULAR | Status: AC
  Filled 2019-12-22: qty 5

## 2019-12-22 MED ORDER — TRAMADOL HCL 50 MG PO TABS
50.0000 mg | ORAL_TABLET | Freq: Four times a day (QID) | ORAL | 0 refills | Status: DC | PRN
Start: 2019-12-22 — End: 2022-07-26

## 2019-12-22 MED ORDER — HYDROMORPHONE HCL 1 MG/ML IJ SOLN
0.5000 mg | INTRAMUSCULAR | Status: DC | PRN
  Administered 2019-12-22: 1 mg via INTRAVENOUS
  Administered 2019-12-23: 0.5 mg via INTRAVENOUS
  Filled 2019-12-22 (×2): qty 1

## 2019-12-22 MED ORDER — MAGNESIUM CITRATE PO SOLN: 1.0000 | Freq: Once | ORAL | Status: DC

## 2019-12-22 MED ORDER — FENTANYL CITRATE (PF) 100 MCG/2ML IJ SOLN
INTRAMUSCULAR | Status: DC | PRN
  Administered 2019-12-22: 75 ug via INTRAVENOUS
  Administered 2019-12-22 (×2): 50 ug via INTRAVENOUS
  Administered 2019-12-22: 75 ug via INTRAVENOUS

## 2019-12-22 MED ORDER — EPHEDRINE SULFATE-NACL 50-0.9 MG/10ML-% IV SOSY
PREFILLED_SYRINGE | INTRAVENOUS | Status: DC | PRN
  Administered 2019-12-22 (×2): 10 mg via INTRAVENOUS

## 2019-12-22 MED ORDER — CEFAZOLIN SODIUM-DEXTROSE 1-4 GM/50ML-% IV SOLN
1.0000 g | Freq: Three times a day (TID) | INTRAVENOUS | Status: DC
  Filled 2019-12-22: qty 50

## 2019-12-22 MED ORDER — BUPIVACAINE LIPOSOME 1.3 % IJ SUSP
20.0000 mL | Freq: Once | INTRAMUSCULAR | Status: AC
  Administered 2019-12-22: 20 mL
  Filled 2019-12-22: qty 20

## 2019-12-22 MED ORDER — HYDROMORPHONE HCL 1 MG/ML IJ SOLN
0.2500 mg | INTRAMUSCULAR | Status: DC | PRN
  Administered 2019-12-22 (×2): 0.5 mg via INTRAVENOUS

## 2019-12-22 MED ORDER — DEXAMETHASONE SODIUM PHOSPHATE 4 MG/ML IJ SOLN
INTRAMUSCULAR | Status: DC | PRN
  Administered 2019-12-22: 10 mg via INTRAVENOUS

## 2019-12-22 MED ORDER — SODIUM CHLORIDE (PF) 0.9 % IJ SOLN
INTRAMUSCULAR | Status: AC
  Filled 2019-12-22: qty 20

## 2019-12-22 MED ORDER — FENTANYL CITRATE (PF) 250 MCG/5ML IJ SOLN
INTRAMUSCULAR | Status: AC
  Filled 2019-12-22: qty 5

## 2019-12-22 MED ORDER — PHENYLEPHRINE HCL (PRESSORS) 10 MG/ML IV SOLN
INTRAVENOUS | Status: DC | PRN
  Administered 2019-12-22: 80 ug via INTRAVENOUS

## 2019-12-22 MED ORDER — MENTHOL 3 MG MT LOZG
1.0000 | LOZENGE | OROMUCOSAL | Status: DC | PRN
  Filled 2019-12-22: qty 9

## 2019-12-22 MED ORDER — CEFAZOLIN SODIUM-DEXTROSE 1-4 GM/50ML-% IV SOLN
1.0000 g | Freq: Three times a day (TID) | INTRAVENOUS | Status: AC
  Administered 2019-12-22: 1 g via INTRAVENOUS
  Filled 2019-12-22 (×2): qty 50

## 2019-12-22 MED ORDER — LACTATED RINGERS IR SOLN
Status: DC | PRN
  Administered 2019-12-22: 1000 mL

## 2019-12-22 MED ORDER — MEPERIDINE HCL 50 MG/ML IJ SOLN: 6.2500 mg | INTRAMUSCULAR | Status: DC | PRN

## 2019-12-22 MED ORDER — ACETAMINOPHEN 10 MG/ML IV SOLN
INTRAVENOUS | Status: AC
  Filled 2019-12-22: qty 100

## 2019-12-22 MED ORDER — DIPHENHYDRAMINE HCL 12.5 MG/5ML PO ELIX: 12.5000 mg | ORAL_SOLUTION | Freq: Four times a day (QID) | ORAL | Status: DC | PRN

## 2019-12-22 MED ORDER — ROCURONIUM BROMIDE 10 MG/ML (PF) SYRINGE
PREFILLED_SYRINGE | INTRAVENOUS | Status: AC
  Filled 2019-12-22: qty 10

## 2019-12-22 MED ORDER — FLUTICASONE PROPIONATE 50 MCG/ACT NA SUSP
2.0000 | Freq: Every day | NASAL | Status: DC
  Filled 2019-12-22: qty 16

## 2019-12-22 MED ORDER — ONDANSETRON HCL 4 MG/2ML IJ SOLN
INTRAMUSCULAR | Status: DC | PRN
  Administered 2019-12-22: 4 mg via INTRAVENOUS

## 2019-12-22 MED ORDER — PANTOPRAZOLE SODIUM 40 MG PO TBEC
40.0000 mg | DELAYED_RELEASE_TABLET | Freq: Every day | ORAL | Status: DC
  Administered 2019-12-23 – 2019-12-24 (×2): 40 mg via ORAL
  Filled 2019-12-22 (×2): qty 1

## 2019-12-22 MED ORDER — SUGAMMADEX SODIUM 200 MG/2ML IV SOLN
INTRAVENOUS | Status: DC | PRN
  Administered 2019-12-22: 200 mg via INTRAVENOUS

## 2019-12-22 MED ORDER — ACETAMINOPHEN 10 MG/ML IV SOLN
1000.0000 mg | Freq: Four times a day (QID) | INTRAVENOUS | Status: AC
  Administered 2019-12-22 – 2019-12-23 (×3): 1000 mg via INTRAVENOUS
  Filled 2019-12-22 (×3): qty 100

## 2019-12-22 MED ORDER — ACETAMINOPHEN 325 MG PO TABS: 325.0000 mg | ORAL_TABLET | Freq: Once | ORAL | Status: DC | PRN

## 2019-12-22 MED ORDER — PROPOFOL 10 MG/ML IV BOLUS
INTRAVENOUS | Status: DC | PRN
  Administered 2019-12-22: 120 mg via INTRAVENOUS

## 2019-12-22 MED ORDER — ACETAMINOPHEN 160 MG/5ML PO SOLN: 325.0000 mg | Freq: Once | ORAL | Status: DC | PRN

## 2019-12-22 MED ORDER — HYDROMORPHONE HCL 1 MG/ML IJ SOLN
INTRAMUSCULAR | Status: AC
  Filled 2019-12-22: qty 1

## 2019-12-22 MED ORDER — CHLORHEXIDINE GLUCONATE CLOTH 2 % EX PADS: 6.0000 | MEDICATED_PAD | Freq: Every day | CUTANEOUS | Status: DC

## 2019-12-22 MED ORDER — PHENYLEPHRINE 40 MCG/ML (10ML) SYRINGE FOR IV PUSH (FOR BLOOD PRESSURE SUPPORT)
PREFILLED_SYRINGE | INTRAVENOUS | Status: AC
  Filled 2019-12-22: qty 10

## 2019-12-22 MED ORDER — CEFAZOLIN SODIUM-DEXTROSE 2-4 GM/100ML-% IV SOLN
2.0000 g | Freq: Once | INTRAVENOUS | Status: AC
  Administered 2019-12-22: 2 g via INTRAVENOUS
  Filled 2019-12-22: qty 100

## 2019-12-22 MED ORDER — ONDANSETRON HCL 4 MG/2ML IJ SOLN
INTRAMUSCULAR | Status: AC
  Filled 2019-12-22: qty 2

## 2019-12-22 MED ORDER — ACETAMINOPHEN 10 MG/ML IV SOLN
1000.0000 mg | Freq: Once | INTRAVENOUS | Status: DC | PRN
  Administered 2019-12-22: 1000 mg via INTRAVENOUS

## 2019-12-22 MED ORDER — DEXAMETHASONE SODIUM PHOSPHATE 10 MG/ML IJ SOLN
INTRAMUSCULAR | Status: AC
  Filled 2019-12-22: qty 1

## 2019-12-22 MED ORDER — ROCURONIUM BROMIDE 10 MG/ML (PF) SYRINGE
PREFILLED_SYRINGE | INTRAVENOUS | Status: DC | PRN
  Administered 2019-12-22: 10 mg via INTRAVENOUS
  Administered 2019-12-22: 70 mg via INTRAVENOUS

## 2019-12-22 MED ORDER — DIPHENHYDRAMINE HCL 50 MG/ML IJ SOLN: 12.5000 mg | Freq: Four times a day (QID) | INTRAMUSCULAR | Status: DC | PRN

## 2019-12-22 MED ORDER — SODIUM CHLORIDE 0.9 % IJ SOLN
INTRAMUSCULAR | Status: DC | PRN
  Administered 2019-12-22: 20 mL

## 2019-12-22 MED FILL — Sodium Chloride Flush IV Soln 0.9%: INTRAVENOUS | Qty: 20 | Status: AC

## 2019-12-22 SURGICAL SUPPLY — 51 items
ADH SKN CLS APL DERMABOND .7 (GAUZE/BANDAGES/DRESSINGS) ×1
AGENT HMST KT MTR STRL THRMB (HEMOSTASIS) ×1
APL PRP STRL LF DISP 70% ISPRP (MISCELLANEOUS) ×1
BAG LAPAROSCOPIC 12 15 PORT 16 (BASKET) ×1 IMPLANT
BAG RETRIEVAL 12/15 (BASKET) ×2
BAG RETRIEVAL 12/15MM (BASKET) ×1
BAG SPEC THK2 15X12 ZIP CLS (MISCELLANEOUS) ×1
BAG ZIPLOCK 12X15 (MISCELLANEOUS) ×3 IMPLANT
BLADE EXTENDED COATED 6.5IN (ELECTRODE) IMPLANT
BLADE SURG SZ10 CARB STEEL (BLADE) IMPLANT
CHLORAPREP W/TINT 26 (MISCELLANEOUS) ×3 IMPLANT
CLIP VESOLOCK LG 6/CT PURPLE (CLIP) ×9 IMPLANT
CLIP VESOLOCK MED LG 6/CT (CLIP) ×3 IMPLANT
CLIP VESOLOCK XL 6/CT (CLIP) IMPLANT
COVER SURGICAL LIGHT HANDLE (MISCELLANEOUS) ×3 IMPLANT
COVER WAND RF STERILE (DRAPES) IMPLANT
CUTTER FLEX LINEAR 45M (STAPLE) IMPLANT
DERMABOND ADVANCED (GAUZE/BANDAGES/DRESSINGS) ×2
DERMABOND ADVANCED .7 DNX12 (GAUZE/BANDAGES/DRESSINGS) ×1 IMPLANT
DRAPE INCISE IOBAN 66X45 STRL (DRAPES) ×3 IMPLANT
DRAPE LAPAROSCOPIC ABDOMINAL (DRAPES) ×3 IMPLANT
DRAPE WARM FLUID 44X44 (DRAPES) IMPLANT
ELECT PENCIL ROCKER SW 15FT (MISCELLANEOUS) ×3 IMPLANT
ELECT REM PT RETURN 15FT ADLT (MISCELLANEOUS) ×3 IMPLANT
GLOVE BIO SURGEON STRL SZ 6.5 (GLOVE) ×2 IMPLANT
GLOVE BIO SURGEONS STRL SZ 6.5 (GLOVE) ×1
GLOVE BIOGEL M STRL SZ7.5 (GLOVE) ×3 IMPLANT
GOWN STRL REUS W/TWL LRG LVL3 (GOWN DISPOSABLE) ×6 IMPLANT
HEMOSTAT SURGICEL 4X8 (HEMOSTASIS) IMPLANT
HOLDER FOLEY CATH W/STRAP (MISCELLANEOUS) ×3 IMPLANT
IRRIG SUCT STRYKERFLOW 2 WTIP (MISCELLANEOUS) ×3
IRRIGATION SUCT STRKRFLW 2 WTP (MISCELLANEOUS) ×1 IMPLANT
KIT BASIN OR (CUSTOM PROCEDURE TRAY) ×3 IMPLANT
KIT TURNOVER KIT A (KITS) IMPLANT
RELOAD 45 VASCULAR/THIN (ENDOMECHANICALS) IMPLANT
SCISSORS LAP 5X35 DISP (ENDOMECHANICALS) IMPLANT
SET TUBE SMOKE EVAC HIGH FLOW (TUBING) IMPLANT
SHEARS HARMONIC ACE PLUS 36CM (ENDOMECHANICALS) ×3 IMPLANT
SURGIFLO W/THROMBIN 8M KIT (HEMOSTASIS) ×3 IMPLANT
SUT MNCRL AB 4-0 PS2 18 (SUTURE) ×6 IMPLANT
SUT PDS AB 1 CTX 36 (SUTURE) ×6 IMPLANT
SUT VIC AB 2-0 SH 27 (SUTURE)
SUT VIC AB 2-0 SH 27X BRD (SUTURE) IMPLANT
SUT VICRYL 0 UR6 27IN ABS (SUTURE) ×3 IMPLANT
TOWEL OR 17X26 10 PK STRL BLUE (TOWEL DISPOSABLE) ×3 IMPLANT
TOWEL OR NON WOVEN STRL DISP B (DISPOSABLE) ×3 IMPLANT
TRAY FOLEY MTR SLVR 16FR STAT (SET/KITS/TRAYS/PACK) ×3 IMPLANT
TRAY LAPAROSCOPIC (CUSTOM PROCEDURE TRAY) ×3 IMPLANT
TROCAR BLADELESS OPT 5 100 (ENDOMECHANICALS) ×3 IMPLANT
TROCAR XCEL 12X100 BLDLESS (ENDOMECHANICALS) ×3 IMPLANT
TROCAR XCEL BLUNT TIP 100MML (ENDOMECHANICALS) ×3 IMPLANT

## 2019-12-22 NOTE — Anesthesia Procedure Notes (Addendum)
Procedure Name: Intubation Date/Time: 12/22/2019 7:29 AM Performed by: Effie Berkshire, MD Pre-anesthesia Checklist: Patient identified, Emergency Drugs available, Suction available and Patient being monitored Patient Re-evaluated:Patient Re-evaluated prior to induction Oxygen Delivery Method: Circle system utilized Preoxygenation: Pre-oxygenation with 100% oxygen Induction Type: IV induction Ventilation: Mask ventilation without difficulty Laryngoscope Size: Glidescope and 3 Tube type: Oral Tube size: 7.5 mm Number of attempts: 3 Airway Equipment and Method: Stylet and Oral airway Placement Confirmation: ETT inserted through vocal cords under direct vision,  positive ETCO2 and breath sounds checked- equal and bilateral Secured at: 21 cm Tube secured with: Tape Dental Injury: Teeth and Oropharynx as per pre-operative assessment  Difficulty Due To: Difficulty was unanticipated Future Recommendations: Recommend- induction with short-acting agent, and alternative techniques readily available Comments: 1st attempt Miller by CRNA - View of epiglottis only 2nd attempt Mac 4 by MDA - View of epiglottis only 3rd attempt Glidescope - uneventful, successful  Recommend glidescope

## 2019-12-22 NOTE — Anesthesia Preprocedure Evaluation (Signed)
Anesthesia Evaluation  Patient identified by MRN, date of birth, ID band Patient awake    Reviewed: Allergy & Precautions, NPO status , Patient's Chart, lab work & pertinent test results  Airway Mallampati: II  TM Distance: >3 FB Neck ROM: Full    Dental  (+) Teeth Intact, Dental Advisory Given   Pulmonary neg pulmonary ROS,    breath sounds clear to auscultation       Cardiovascular hypertension,  Rhythm:Regular Rate:Normal     Neuro/Psych negative neurological ROS  negative psych ROS   GI/Hepatic Neg liver ROS, GERD  ,  Endo/Other  negative endocrine ROS  Renal/GU negative Renal ROS     Musculoskeletal  (+) Arthritis ,   Abdominal Normal abdominal exam  (+)   Peds  Hematology negative hematology ROS (+)   Anesthesia Other Findings   Reproductive/Obstetrics                             Anesthesia Physical Anesthesia Plan  ASA: II  Anesthesia Plan: General   Post-op Pain Management:    Induction: Intravenous  PONV Risk Score and Plan: 3 and Ondansetron, Dexamethasone and Treatment may vary due to age or medical condition  Airway Management Planned: Oral ETT  Additional Equipment: None  Intra-op Plan:   Post-operative Plan: Extubation in OR  Informed Consent: I have reviewed the patients History and Physical, chart, labs and discussed the procedure including the risks, benefits and alternatives for the proposed anesthesia with the patient or authorized representative who has indicated his/her understanding and acceptance.     Dental advisory given  Plan Discussed with: CRNA  Anesthesia Plan Comments: (2 IV's)        Anesthesia Quick Evaluation

## 2019-12-22 NOTE — Progress Notes (Signed)
Patient ID: RAYDEN DOCK, male   DOB: Jun 30, 1945, 74 y.o.   MRN: [id number]  Post-op note  Subjective: The patient is doing well.  No complaints.  Objective: Vital signs in last 24 hours: Temp:  [97.7 F (36.5 C)-98.2 F (36.8 C)] 97.9 F (36.6 C) (11/15 1116) Pulse Rate:  [87-96] 93 (11/15 1116) Resp:  [11-18] 18 (11/15 1116) BP: (128-158)/(62-93) 128/62 (11/15 1116) SpO2:  [100 %] 100 % (11/15 1116) Weight:  [77.5 kg-78.5 kg] 77.5 kg (11/15 0548)  Intake/Output from previous day: No intake/output data recorded. Intake/Output this shift: Total I/O In: 800 [I.V.:800] Out: 300 [Urine:250; Blood:50]  Physical Exam:  General: Alert and oriented. Abdomen: Soft, Nondistended. Incisions: Clean and dry.  Lab Results: Recent Labs    12/22/19 1046  HGB 12.6*  HCT 41.8    Assessment/Plan: POD#0   1) Continue to monitor, ambulate, IS   Pryor Curia. MD   LOS: 0 days   [name] 12/22/2019, 2:27 PM

## 2019-12-22 NOTE — Transfer of Care (Signed)
Immediate Anesthesia Transfer of Care Note  Patient: Chad Palmer  Procedure(s) Performed: LAPAROSCOPIC RADICAL NEPHRECTOMY (Left )  Patient Location: PACU  Anesthesia Type:General  Level of Consciousness: awake  Airway & Oxygen Therapy: Patient Spontanous Breathing and Patient connected to face mask oxygen  Post-op Assessment: Report given to RN and Post -op Vital signs reviewed and stable  Post vital signs: Reviewed and stable  Last Vitals:  Vitals Value Taken Time  BP 134/66 12/22/19 0955  Temp    Pulse 90 12/22/19 0956  Resp 16 12/22/19 0956  SpO2 100 % 12/22/19 0956  Vitals shown include unvalidated device data.  Last Pain:  Vitals:   12/22/19 0614  TempSrc:   PainSc: 3       Patients Stated Pain Goal: 2 (30/10/40 4591)  Complications: No complications documented.

## 2019-12-22 NOTE — Anesthesia Postprocedure Evaluation (Signed)
Anesthesia Post Note  Patient: Chad Palmer  Procedure(s) Performed: LAPAROSCOPIC RADICAL NEPHRECTOMY (Left )     Patient location during evaluation: PACU Anesthesia Type: General Level of consciousness: awake and alert Pain management: pain level controlled Vital Signs Assessment: post-procedure vital signs reviewed and stable Respiratory status: spontaneous breathing, nonlabored ventilation, respiratory function stable and patient connected to nasal cannula oxygen Cardiovascular status: blood pressure returned to baseline and stable Postop Assessment: no apparent nausea or vomiting Anesthetic complications: no   No complications documented.  Last Vitals:  Vitals:   12/22/19 1045 12/22/19 1100  BP: (!) 141/78   Pulse: 90   Resp: 13   Temp:  36.5 C  SpO2: 100%     Last Pain:  Vitals:   12/22/19 1045  TempSrc:   PainSc: [name]

## 2019-12-22 NOTE — Op Note (Signed)
Preoperative diagnosis: Left renal neoplasm  Postoperative diagnosis: Left renal neoplasm  Procedure: 1.  Left laparoscopic radical nephrectomy  Surgeon: Pryor Curia. M.D.  Assistant(s): Debbrah Alar, PA-C  An assistant was required for this surgical procedure.  The duties of the assistant included but were not limited to suctioning, passing suture, camera manipulation, retraction. This procedure would not be able to be performed without an Environmental consultant.  Anesthesia: General  Complications: None  EBL: 50 mL  IVF:  1000 mL crystalloid  Specimens: 1. Left kidney  Disposition of specimens: Pathology  Indication: Chad Palmer is a 74 y.o. patient with a left renal tumor suspicious for malignancy.  After a thorough review of the management options for their renal mass, they elected to proceed with surgical treatment and the above procedure.  We have discussed the potential benefits and risks of the procedure, side effects of the proposed treatment, the likelihood of the patient achieving the goals of the procedure, and any potential problems that might occur during the procedure or recuperation. Informed consent has been obtained.  Description of procedure:  The patient was taken to the operating room and a general anesthetic was administered. The patient was given preoperative antibiotics, placed in the left modified flank position, and prepped and draped in the usual sterile fashion. Next a preoperative timeout was performed.  A site was selected near the umbilicus for placement of the camera port. This was placed using a standard open Hassan technique which allowed entry into the peritoneal cavity under direct vision and without difficulty. A 12 mm Hassan cannula was placed and a pneumoperitoneum established. The camera was then used to inspect the abdomen and there was no evidence of any intra-abdominal injuries or other abnormalities. The remaining abdominal ports were then  placed. A 12 mm port was placed in the left lower quadrant and a 5 mm port was placed in the left upper quadrant.  All ports were placed under direct vision without difficulty.  Utilizing the harmonic scalpel, the white line of Toldt was incised allowing the colon to be reflected medially and the plane between the mesocolon and the anterior layer of Gerota's fascia to be developed and the kidney exposed.  The ureter and gonadal vein were identified inferiorly and the ureter was lifted anteriorly off the psoas muscle.  Dissection proceeded superiorly along the gonadal vein until the renal vein was identified.  Notably, the patient had a retroaortic renal vein that entered the IVC low near the bifurcation.  The renal hilum was then carefully isolated with a combination of blunt and sharp dissectiong allowing the renal arterial and venous structures to be separated and isolated.    The renal artery was isolated and ligated with multiple Weck clips and subsequently divided.  The renal vein was then isolated and also ligated and divided with a 45 mm Flex ETS stapler.  Gerota's fascia was intentionally entered superiorly and the space between the adrenal gland and the kidney was developed allowing the adrenal gland to be spared.  The splenorenal ligaments were divided with the harmonic scalpel.  The lateral and posterior attachements to the kidney were then divided.  The ureter was ligated with Weck clips and divided allowing the specimen to be freed from all surrounding structures.  The kidney specimen was then placed into a 12 mm retrieval bag.  The renal hilum, liver, adrenal bed and gonadal vein areas were each inspected and hemostasis was ensured with the pneomperitoneal pressures lowered.  The 12  mm lower quadrant port was then closed with a 0-vicryl suture placed laparoscopically to close the fascia of this incision. All remaining ports were removed under direct vision.  The kidney specimen was removed  intact within the retrieval bag via the camera port site after this incision was extended slightly. This fascial opening was then closed with two #1 PDS sutures.    All incisions were injected with local anesthetic and reapproximated at the skin with 4-0 monocryl sutures.  Dermabond was applied to the skin. The patient tolerated the procedure well and without complications and was transferred to the recovery unit in satisfactory condition.   Pryor Curia MD

## 2019-12-22 NOTE — Interval H&P Note (Signed)
History and Physical Interval Note:  12/22/2019 6:56 AM  Chad Palmer  has presented today for surgery, with the diagnosis of LEFT RENAL NEOPLASM.  The various methods of treatment have been discussed with the patient and family. After consideration of risks, benefits and other options for treatment, the patient has consented to  Procedure(s): LAPAROSCOPIC RADICAL NEPHRECTOMY (Left) as a surgical intervention.  The patient's history has been reviewed, patient examined, no change in status, stable for surgery.  I have reviewed the patient's chart and labs.  Questions were answered to the patient's satisfaction.     [employer]

## 2019-12-22 NOTE — Anesthesia Procedure Notes (Signed)
Performed by: Lieutenant Diego, CRNA

## 2019-12-23 ENCOUNTER — Encounter (HOSPITAL_COMMUNITY): Payer: Self-pay | Admitting: Urology

## 2019-12-23 LAB — HEMOGLOBIN AND HEMATOCRIT, BLOOD
HCT: 39.2 % (ref 39.0–52.0)
Hemoglobin: 11.8 g/dL — ABNORMAL LOW (ref 13.0–17.0)

## 2019-12-23 LAB — BASIC METABOLIC PANEL
Anion gap: 7 (ref 5–15)
BUN: 16 mg/dL (ref 8–23)
CO2: 28 mmol/L (ref 22–32)
Calcium: 8.2 mg/dL — ABNORMAL LOW (ref 8.9–10.3)
Chloride: 101 mmol/L (ref 98–111)
Creatinine, Ser: 1.48 mg/dL — ABNORMAL HIGH (ref 0.61–1.24)
GFR, Estimated: 49 mL/min — ABNORMAL LOW (ref >60)
Glucose, Bld: 143 mg/dL — ABNORMAL HIGH (ref 70–99)
Potassium: 3.7 mmol/L (ref 3.5–5.1)
Sodium: 136 mmol/L (ref 135–145)

## 2019-12-23 MED ORDER — TRAMADOL HCL 50 MG PO TABS
50.0000 mg | ORAL_TABLET | Freq: Four times a day (QID) | ORAL | Status: DC | PRN
  Administered 2019-12-23 (×2): 50 mg via ORAL
  Filled 2019-12-23 (×2): qty 1

## 2019-12-23 MED ORDER — DEXTROSE-NACL 5-0.9 % IV SOLN: INTRAVENOUS | Status: DC

## 2019-12-23 MED ORDER — METOCLOPRAMIDE HCL 5 MG/ML IJ SOLN
5.0000 mg | Freq: Four times a day (QID) | INTRAMUSCULAR | Status: DC
  Administered 2019-12-23 – 2019-12-24 (×5): 5 mg via INTRAVENOUS
  Filled 2019-12-23 (×5): qty 2

## 2019-12-23 MED ORDER — BISACODYL 10 MG RE SUPP
10.0000 mg | Freq: Once | RECTAL | Status: AC
  Administered 2019-12-23: 10 mg via RECTAL

## 2019-12-23 MED ORDER — BISACODYL 10 MG RE SUPP
10.0000 mg | Freq: Once | RECTAL | Status: AC
  Administered 2019-12-23: 10 mg via RECTAL
  Filled 2019-12-23: qty 1

## 2019-12-23 NOTE — Progress Notes (Signed)
Patient ID: Chad Palmer, male   DOB: [birth year], 74 y.o.   MRN: [id number]  Pt with abdominal distention and nausea.  No flatus yet.  Abd: Distended, minimal BS Inc: C/D/I  Path pending.  - Restart IVF - Suppository, Reglan - Ambulate - Reassess in AM for discharge

## 2019-12-23 NOTE — Progress Notes (Signed)
Patient ID: Chad Palmer, male   DOB: 1945-02-21, 74 y.o.   MRN: [id number]  1 Day Post-Op Subjective: Pt doing well.  Tolerating diet.  Ambulating well.  Pain controlled.  Objective: Vital signs in last 24 hours: Temp:  [97.5 F (36.4 C)-98.2 F (36.8 C)] 97.9 F (36.6 C) (11/16 0622) Pulse Rate:  [60-97] 60 (11/16 0622) Resp:  [11-20] 20 (11/16 0622) BP: (123-159)/(62-93) 123/77 (11/16 0622) SpO2:  [98 %-100 %] 100 % (11/16 0622)  Intake/Output from previous day: 11/15 0701 - 11/16 0700 In: 3911.1 [P.O.:500; I.V.:3139; IV Piggyback:272.2] Out: 2300 [Urine:2250; Blood:50] Intake/Output this shift: No intake/output data recorded.  Physical Exam:  General: Alert and oriented CV: RRR Lungs: Clear Abdomen: Soft, ND Incisions: C/D/I Ext: NT, No erythema  Lab Results: Recent Labs    12/22/19 1046 12/23/19 0553  HGB 12.6* 11.8*  HCT 41.8 39.2   BMET Recent Labs    12/22/19 1046 12/23/19 0553  NA 137 136  K 4.1 3.7  CL 101 101  CO2 26 28  GLUCOSE 137* 143*  BUN 12 16  CREATININE 1.24 1.48*  CALCIUM 8.7* 8.2*     Studies/Results: No results found.  Assessment/Plan: POD # 1 s/p left laparoscopic radical nephrectomy - SL IVF - Ambulate, IS - Advance diet - Oral pain medication - D/C Foley - Dulcolax suppository - Evaluate for possible discharge later today   LOS: 1 day   [name] 12/23/2019, 7:55 AM

## 2019-12-23 NOTE — Plan of Care (Signed)
  Problem: Education: Goal: Knowledge of General Education information will improve Description: Including pain rating scale, medication(s)/side effects and non-pharmacologic comfort measures Outcome: Progressing   Problem: Clinical Measurements: Goal: Ability to maintain clinical measurements within normal limits will improve Outcome: Progressing   Problem: Pain Managment: Goal: General experience of comfort will improve 12/23/2019 1724 by Wynona Neat, RN Outcome: Progressing 12/22/2019 1954 by Wynona Neat, RN Outcome: Progressing   Problem: Safety: Goal: Ability to remain free from injury will improve Outcome: Progressing

## 2019-12-24 LAB — BASIC METABOLIC PANEL
Anion gap: 6 (ref 5–15)
BUN: 15 mg/dL (ref 8–23)
CO2: 29 mmol/L (ref 22–32)
Calcium: 8.9 mg/dL (ref 8.9–10.3)
Chloride: 100 mmol/L (ref 98–111)
Creatinine, Ser: 1.79 mg/dL — ABNORMAL HIGH (ref 0.61–1.24)
GFR, Estimated: 39 mL/min — ABNORMAL LOW (ref >60)
Glucose, Bld: 131 mg/dL — ABNORMAL HIGH (ref 70–99)
Potassium: 4.2 mmol/L (ref 3.5–5.1)
Sodium: 135 mmol/L (ref 135–145)

## 2019-12-24 LAB — SURGICAL PATHOLOGY

## 2019-12-24 MED ORDER — FLEET ENEMA 7-19 GM/118ML RE ENEM
1.0000 | ENEMA | Freq: Once | RECTAL | Status: AC
  Administered 2019-12-24: 1 via RECTAL
  Filled 2019-12-24: qty 1

## 2019-12-24 MED ORDER — MAGNESIUM HYDROXIDE 400 MG/5ML PO SUSP
15.0000 mL | Freq: Once | ORAL | Status: AC
  Administered 2019-12-24: 15 mL via ORAL
  Filled 2019-12-24: qty 30

## 2019-12-24 MED ORDER — SIMETHICONE 80 MG PO CHEW
80.0000 mg | CHEWABLE_TABLET | Freq: Four times a day (QID) | ORAL | Status: DC | PRN
  Administered 2019-12-24: 80 mg via ORAL
  Filled 2019-12-24: qty 1

## 2019-12-24 NOTE — Progress Notes (Signed)
Patient ID: Chad Palmer, male   DOB: 1945-10-11, 74 y.o.   MRN: [id number]  2 Days Post-Op Subjective: Pt with 2 small bowel movements overnight.  However, still with poor appetite and abdominal distention/pain.  Objective: Vital signs in last 24 hours: Temp:  [97.5 F (36.4 C)-98.9 F (37.2 C)] 98.9 F (37.2 C) (11/17 0536) Pulse Rate:  [68-94] 94 (11/17 0536) Resp:  [12-18] 16 (11/17 0536) BP: (131-159)/(65-90) 159/90 (11/17 0536) SpO2:  [98 %-99 %] 98 % (11/17 0536)  Intake/Output from previous day: 11/16 0701 - 11/17 0700 In: 1086.1 [P.O.:360; I.V.:726.1] Out: 1875 [Urine:1875] Intake/Output this shift: No intake/output data recorded.  Physical Exam:  General: Alert and oriented CV: RRR Lungs: Clear Abdomen: Soft, distended, tender over entire abdomen but not localized Incisions: C/D/I Ext: NT, No erythema  Lab Results: Recent Labs    12/22/19 1046 12/23/19 0553  HGB 12.6* 11.8*  HCT 41.8 39.2   BMET Recent Labs    12/23/19 0553 12/24/19 0551  NA 136 135  K 3.7 4.2  CL 101 100  CO2 28 29  GLUCOSE 143* 131*  BUN 16 15  CREATININE 1.48* 1.79*  CALCIUM 8.2* 8.9     Studies/Results: Path pending  Assessment/Plan: POD # 2 s/p left laparoscopic radical nephrectomy - Continue IVF hydration - Enema, MOM, ambulation - Await return of bowel function - Monitor renal function - Path pending   LOS: 2 days   [name] 12/24/2019, 7:22 AM

## 2019-12-24 NOTE — Progress Notes (Signed)
Patient is up ambulating in the hall for second time. Enema given earlier along with other meds to stimulate bowels. Had very small amount of liquid brown return but no stool yet. Pt states no gas as of yet either but is belching. Continue to monitor. Eulas Post, RN

## 2019-12-24 NOTE — Progress Notes (Signed)
Patient ID: Chad Palmer, male   DOB: 1945-08-25, 74 y.o.   MRN: [id number]   Pt now much improved.  Passing flatus regularly. Tolerating diet.   Abd: Much less distended  Plan: D/C home

## 2019-12-24 NOTE — Progress Notes (Signed)
Patient has ambulated frequently today and has passed "more gas" but no stool. Appetite not very good "because of hospital food". Continue to monitor. Eulas Post, RN

## 2019-12-24 NOTE — Discharge Summary (Signed)
  Date of admission: 12/22/2019  Date of discharge: 12/24/2019  Admission diagnosis: Left renal neoplasm  Discharge diagnosis: Renal cell carcinoma  Secondary diagnoses: GERD, hypertension  History and Physical: For full details, please see admission history and physical. Briefly, Chad Palmer is a 74 y.o. year old patient with a left renal tumor suspicious for malignancy.Marland Kitchen   Hospital Course: He underwent a left laparoscopic radical nephrectomy on 12/22/19.  He tolerated surgery well.  He remained hemodynamically stable.  His Cr increased to 1.8 by POD # 2.  He did have a very mild postoperative ileus on POD # 1.  On POD # 2, he began passing flatus.  He was ambulating well and tolerating his diet.  He was stable for discharge home.  His pathology returned and was discussed.  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  Laboratory values: Recent Labs    12/22/19 1046 12/23/19 0553  HGB 12.6* 11.8*  HCT 41.8 39.2   Recent Labs    12/23/19 0553 12/24/19 0551  CREATININE 1.48* 1.79*    Disposition: Home  Discharge instruction: The patient was instructed to be ambulatory but told to refrain from heavy lifting, strenuous activity, or driving.   Discharge medications:  Allergies as of 12/24/2019      Reactions   Levofloxacin Nausea Only   Omeprazole    Chest pain      Medication List    STOP taking these medications   naproxen sodium 220 MG tablet Commonly known as: ALEVE     TAKE these medications   ALIGN PREBIOTIC-PROBIOTIC PO Take 1 capsule by mouth at bedtime.   B-12 2500 MCG Tabs Take 2,500 mcg by mouth daily.   diclofenac sodium 1 % Gel Commonly known as: Voltaren Apply 1 application topically 4 (four) times daily. What changed:   when to take this  reasons to take this   esomeprazole 40 MG capsule Commonly known as: NexIUM Take 1 capsule shortly before breakfast each day. What changed:   how much to take  how to take  this  when to take this  additional instructions   fluticasone 50 MCG/ACT nasal spray Commonly known as: FLONASE Place 2 sprays into both nostrils daily.   Melatonin 10 MG Caps Take 10 mg by mouth at bedtime as needed (sleep).   sildenafil 25 MG tablet Commonly known as: VIAGRA Take 25 mg by mouth daily as needed for erectile dysfunction.   traMADol 50 MG tablet Commonly known as: Ultram Take 1-2 tablets (50-100 mg total) by mouth every 6 (six) hours as needed for moderate pain or severe pain.   valsartan-hydrochlorothiazide 320-25 MG tablet Commonly known as: DIOVAN-HCT Take 1 tablet by mouth daily. What changed: how much to take   Vitamin D3 50 MCG (2000 UT) capsule Take 1 capsule (2,000 Units total) by mouth daily.   zolpidem 10 MG tablet Commonly known as: AMBIEN Take 1 tablet (10 mg total) by mouth at bedtime. What changed:   when to take this  reasons to take this   ZzzQuil 50 MG/30ML Liqd Generic drug: diphenhydrAMINE HCl Take 50 mg by mouth at bedtime as needed (sleep).       Followup:   Follow-up Information    Raynelle Bring, MD On 01/20/2020.   Specialty: Urology Why: at 1:45 Contact information: [place] [address] [phone]

## 2019-12-24 NOTE — Discharge Instructions (Signed)
1.  Activity:  You are encouraged to ambulate frequently (about every hour during waking hours) to help prevent blood clots from forming in your legs or lungs.  However, you should not engage in any heavy lifting (> 10-15 lbs), strenuous activity, or straining. 2. Diet: You should advance your diet as instructed by your physician.  It will be normal to have some bloating, nausea, and abdominal discomfort intermittently. 3. Prescriptions:  You will be provided a prescription for pain medication to take as needed.  If your pain is not severe enough to require the prescription pain medication, you may take extra strength Tylenol instead which will have less side effects.  You should also take a prescribed stool softener to avoid straining with bowel movements as the prescription pain medication may constipate you. 4. Incisions: You may remove your dressing bandages 48 hours after surgery if not removed in the hospital.  You will either have some small staples or special tissue glue at each of the incision sites. Once the bandages are removed (if present), the incisions may stay open to air.  You may start showering (but not soaking or bathing in water) the 2nd day after surgery and the incisions simply need to be patted dry after the shower.  No additional care is needed. 5. What to call us about: You should call the office [phone]) if you develop fever > 101 or develop persistent vomiting.      December 22, 2019  Chad Palmer [address] [place] [address]   Dear Chad Palmer,   I hope you are doing well after your surgery.  Included with this note is the letter you should have received after your surgery. I think it is always a good idea to let the anesthesia team know about previous difficult intubations. I mentioned this to you in the recovery room, but you may not remember our discussion. We used a "Glidescope" for the procedure and our technique is documented in our anesthetic  record.  This record is available should others need to review it. If you have any questions about the procedure, please let me know and I will be happy to help.   During your recent procedure at [facility], your anesthesia team determined that you were a difficult intubation.  This means that there was difficulty in placing a breathing tube from your mouth into your windpipe.  This procedure is commonly performed for general anesthetics for the purpose of providing oxygen and anesthetic gases during the operation.  Routinely, this is done using an instrument, known as a laryngoscope, to visualize the vocal cords and directly place the breathing tube into the trachea.  It is very important for you to tell your Anesthesiologist when you have future operations, that you are a difficult intubation so that other arrangements, personnel, or instruments can be obtained to ensure your safety.   We recommend that you obtain and wear a Medical Alert Bracelet in case an emergency arises.  There are many companies who will sell you such a bracelet in many styles.  They are easily found on the internet by searching "Medical Alert Bracelet."  Yours should have the words "Difficult Intubation" written on the bracelet.   It was a pleasure to take care of you for your surgery, and I hope the best for you in the future. Should you have any questions, please feel free to contact me at [phone].   Chad Sickles Smith Robert, MD, [facility] Anesthesiology

## 2019-12-26 ENCOUNTER — Telehealth: Payer: Self-pay

## 2019-12-26 NOTE — Telephone Encounter (Cosign Needed)
Transition Care Management Follow-up Telephone Call  Date of discharge and from where: 12/24/2019 from [facility]  How have you been since you were released from the hospital? Recovering slowly; appetite just starting to come back.  Any questions or concerns? No  Items Reviewed:  Did the pt receive and understand the discharge instructions provided? Yes   Medications obtained and verified? Yes   Other? No   Any new allergies since your discharge? No   Dietary orders reviewed? Yes  Do you have support at home? Yes  wife and son  [place] and Equipment/Supplies: Were home health services ordered? no If so, what is the name of the agency? n/a  Has the agency set up a time to come to the patient's home? not applicable Were any new equipment or medical supplies ordered?  No What is the name of the medical supply agency? n/a Were you able to get the supplies/equipment? not applicable Do you have any questions related to the use of the equipment or supplies? No  Functional Questionnaire: (I = Independent and D = Dependent) ADLs: I  Bathing/Dressing- I  Meal Prep- I  Eating- I  Maintaining continence- I  Transferring/Ambulation- I   Managing Meds- I  Follow up appointments reviewed:   PCP Hospital f/u appt confirmed? Yes  Scheduled to see Walker Kehr, MD  on 01/13/2020 @ 2:20 pm.  [facility] f/u appt confirmed? Yes  Scheduled to see [name], MD on 01/20/2020 @ 1:45 pm.  Are transportation arrangements needed? No   If their condition worsens, is the pt aware to call PCP or go to the Emergency Dept.? Yes  Was the patient provided with contact information for the PCP's office or ED? Yes  Was to pt encouraged to call back with questions or concerns? Yes

## 2020-01-13 ENCOUNTER — Ambulatory Visit (INDEPENDENT_AMBULATORY_CARE_PROVIDER_SITE_OTHER): Payer: Medicare Other | Admitting: Internal Medicine

## 2020-01-13 ENCOUNTER — Other Ambulatory Visit: Payer: Self-pay

## 2020-01-13 ENCOUNTER — Encounter: Payer: Self-pay | Admitting: Internal Medicine

## 2020-01-13 VITALS — BP 128/78 | HR 86 | Temp 98.4°F | Wt 169.6 lb

## 2020-01-13 DIAGNOSIS — I10 Essential (primary) hypertension: Secondary | ICD-10-CM | POA: Diagnosis not present

## 2020-01-13 DIAGNOSIS — N2889 Other specified disorders of kidney and ureter: Secondary | ICD-10-CM

## 2020-01-13 DIAGNOSIS — C642 Malignant neoplasm of left kidney, except renal pelvis: Secondary | ICD-10-CM | POA: Diagnosis not present

## 2020-01-13 DIAGNOSIS — D649 Anemia, unspecified: Secondary | ICD-10-CM

## 2020-01-13 DIAGNOSIS — R634 Abnormal weight loss: Secondary | ICD-10-CM | POA: Diagnosis not present

## 2020-01-13 LAB — CBC WITH DIFFERENTIAL/PLATELET
Basophils Absolute: 0.1 10*3/uL (ref 0.0–0.1)
Basophils Relative: 1.2 % (ref 0.0–3.0)
Eosinophils Absolute: 0.2 10*3/uL (ref 0.0–0.7)
Eosinophils Relative: 2.5 % (ref 0.0–5.0)
HCT: 38.8 % — ABNORMAL LOW (ref 39.0–52.0)
Hemoglobin: 12.1 g/dL — ABNORMAL LOW (ref 13.0–17.0)
Lymphocytes Relative: 17.9 % (ref 12.0–46.0)
Lymphs Abs: 1.3 10*3/uL (ref 0.7–4.0)
MCHC: 31.3 g/dL (ref 30.0–36.0)
MCV: 57.2 fl — ABNORMAL LOW (ref 78.0–100.0)
Monocytes Absolute: 0.6 10*3/uL (ref 0.1–1.0)
Monocytes Relative: 7.8 % (ref 3.0–12.0)
Neutro Abs: 5 10*3/uL (ref 1.4–7.7)
Neutrophils Relative %: 70.6 % (ref 43.0–77.0)
Platelets: 196 10*3/uL (ref 150.0–400.0)
RBC: 6.78 Mil/uL — ABNORMAL HIGH (ref 4.22–5.81)
RDW: 16.2 % — ABNORMAL HIGH (ref 11.5–15.5)
WBC: 7.1 10*3/uL (ref 4.0–10.5)

## 2020-01-13 LAB — URINALYSIS
Bilirubin Urine: NEGATIVE
Hgb urine dipstick: NEGATIVE
Ketones, ur: NEGATIVE
Leukocytes,Ua: NEGATIVE
Nitrite: NEGATIVE
Specific Gravity, Urine: 1.01 (ref 1.000–1.030)
Total Protein, Urine: NEGATIVE
Urine Glucose: NEGATIVE
Urobilinogen, UA: 0.2 (ref 0.0–1.0)
pH: 6 (ref 5.0–8.0)

## 2020-01-13 LAB — COMPREHENSIVE METABOLIC PANEL
ALT: 10 U/L (ref 0–53)
AST: 11 U/L (ref 0–37)
Albumin: 4.3 g/dL (ref 3.5–5.2)
Alkaline Phosphatase: 77 U/L (ref 39–117)
BUN: 24 mg/dL — ABNORMAL HIGH (ref 6–23)
CO2: 33 mEq/L — ABNORMAL HIGH (ref 19–32)
Calcium: 9.1 mg/dL (ref 8.4–10.5)
Chloride: 100 mEq/L (ref 96–112)
Creatinine, Ser: 2.01 mg/dL — ABNORMAL HIGH (ref 0.40–1.50)
GFR: 31.67 mL/min — ABNORMAL LOW (ref >60.00)
Glucose, Bld: 126 mg/dL — ABNORMAL HIGH (ref 70–99)
Potassium: 4 mEq/L (ref 3.5–5.1)
Sodium: 139 mEq/L (ref 135–145)
Total Bilirubin: 0.7 mg/dL (ref 0.2–1.2)
Total Protein: 6.9 g/dL (ref 6.0–8.3)

## 2020-01-13 MED ORDER — VALSARTAN-HYDROCHLOROTHIAZIDE 320-25 MG PO TABS
1.0000 | ORAL_TABLET | Freq: Every day | ORAL | 3 refills | Status: DC
Start: 2020-01-13 — End: 2020-01-14

## 2020-01-13 MED ORDER — ESOMEPRAZOLE MAGNESIUM 40 MG PO CPDR
DELAYED_RELEASE_CAPSULE | ORAL | 3 refills | Status: DC
Start: 2020-01-13 — End: 2021-04-08

## 2020-01-13 MED ORDER — ZOLPIDEM TARTRATE 10 MG PO TABS
10.0000 mg | ORAL_TABLET | Freq: Every day | ORAL | 1 refills | Status: DC
Start: 2020-01-13 — End: 2020-07-20

## 2020-01-13 NOTE — Assessment & Plan Note (Signed)
Diovan

## 2020-01-13 NOTE — Assessment & Plan Note (Signed)
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

## 2020-01-13 NOTE — Assessment & Plan Note (Signed)
Wt Readings from Last 3 Encounters:  01/13/20 169 lb 9.6 oz (76.9 kg)  12/22/19 170 lb 12.8 oz (77.5 kg)  12/15/19 173 lb (78.5 kg)

## 2020-01-13 NOTE — Progress Notes (Signed)
Subjective:  Patient ID: Chad Palmer, male    DOB: 26-Jun-1945  Age: 74 y.o. MRN: [id number]  CC: Follow-up (TCM Hosp f/u)   HPI RONDEY FALLEN presents for L nephrectomy - renal cell Ca 12/22/19 - Dr Alinda Money - pT1b Nx Mx, Grade 2 clear cell RCC with negative surgical margins. F/u anemia, wt loss  Outpatient Medications Prior to Visit  Medication Sig Dispense Refill  . Bacillus Coagulans-Inulin (ALIGN PREBIOTIC-PROBIOTIC PO) Take 1 capsule by mouth at bedtime.    . Cholecalciferol (VITAMIN D3) 50 MCG (2000 UT) capsule Take 1 capsule (2,000 Units total) by mouth daily. 100 capsule 3  . Cyanocobalamin (B-12) 2500 MCG TABS Take 2,500 mcg by mouth daily.    . diclofenac sodium (VOLTAREN) 1 % GEL Apply 1 application topically 4 (four) times daily. (Patient taking differently: Apply 1 application topically 4 (four) times daily as needed (pain). ) 100 g 3  . diphenhydrAMINE HCl (ZZZQUIL) 50 MG/30ML LIQD Take 50 mg by mouth at bedtime as needed (sleep).    Marland Kitchen esomeprazole (NEXIUM) 40 MG capsule Take 1 capsule shortly before breakfast each day. (Patient taking differently: Take 40 mg by mouth daily before breakfast. ) 30 capsule 11  . fluticasone (FLONASE) 50 MCG/ACT nasal spray Place 2 sprays into both nostrils daily. 48 g 3  . Melatonin 10 MG CAPS Take 10 mg by mouth at bedtime as needed (sleep).    . sildenafil (VIAGRA) 25 MG tablet Take 25 mg by mouth daily as needed for erectile dysfunction.    . traMADol (ULTRAM) 50 MG tablet Take 1-2 tablets (50-100 mg total) by mouth every 6 (six) hours as needed for moderate pain or severe pain. 20 tablet 0  . valsartan-hydrochlorothiazide (DIOVAN-HCT) 320-25 MG tablet Take 1 tablet by mouth daily. (Patient taking differently: Take 0.5 tablets by mouth daily. ) 90 tablet 3  . zolpidem (AMBIEN) 10 MG tablet Take 1 tablet (10 mg total) by mouth at bedtime. (Patient taking differently: Take 10 mg by mouth at bedtime as needed for sleep. ) 90 tablet 1   No  facility-administered medications prior to visit.    ROS: Review of Systems  Constitutional: Negative for appetite change, fatigue and unexpected weight change.  HENT: Negative for congestion, nosebleeds, sneezing, sore throat and trouble swallowing.   Eyes: Negative for itching and visual disturbance.  Respiratory: Negative for cough.   Cardiovascular: Negative for chest pain, palpitations and leg swelling.  Gastrointestinal: Negative for abdominal distention, blood in stool, diarrhea and nausea.  Genitourinary: Negative for frequency and hematuria.  Musculoskeletal: Negative for back pain, gait problem, joint swelling and neck pain.  Skin: Negative for rash.  Neurological: Negative for dizziness, tremors, speech difficulty and weakness.  Psychiatric/Behavioral: Negative for agitation, dysphoric mood and sleep disturbance. The patient is not nervous/anxious.     Objective:  BP 128/78 (BP Location: Left Arm)   Pulse 86   Temp 98.4 F (36.9 C) (Oral)   Wt 169 lb 9.6 oz (76.9 kg)   SpO2 97%   BMI 25.79 kg/m   BP Readings from Last 3 Encounters:  01/13/20 128/78  12/24/19 (!) 167/99  12/15/19 117/69    Wt Readings from Last 3 Encounters:  01/13/20 169 lb 9.6 oz (76.9 kg)  12/22/19 170 lb 12.8 oz (77.5 kg)  12/15/19 173 lb (78.5 kg)    Physical Exam Constitutional:      General: He is not in acute distress.    Appearance: He is well-developed.  Comments: NAD  Eyes:     Conjunctiva/sclera: Conjunctivae normal.     Pupils: Pupils are equal, round, and reactive to light.  Neck:     Thyroid: No thyromegaly.     Vascular: No JVD.  Cardiovascular:     Rate and Rhythm: Normal rate and regular rhythm.     Heart sounds: Normal heart sounds. No murmur heard.  No friction rub. No gallop.   Pulmonary:     Effort: Pulmonary effort is normal. No respiratory distress.     Breath sounds: Normal breath sounds. No wheezing or rales.  Chest:     Chest wall: No tenderness.   Abdominal:     General: Bowel sounds are normal. There is no distension.     Palpations: Abdomen is soft. There is no mass.     Tenderness: There is no abdominal tenderness. There is no guarding or rebound.  Musculoskeletal:        General: No tenderness. Normal range of motion.     Cervical back: Normal range of motion.  Lymphadenopathy:     Cervical: No cervical adenopathy.  Skin:    General: Skin is warm and dry.     Findings: No rash.  Neurological:     Mental Status: He is alert and oriented to person, place, and time.     Cranial Nerves: No cranial nerve deficit.     Motor: No abnormal muscle tone.     Coordination: Coordination normal.     Gait: Gait normal.     Deep Tendon Reflexes: Reflexes are normal and symmetric.  Psychiatric:        Behavior: Behavior normal.        Thought Content: Thought content normal.        Judgment: Judgment normal.    Healing abd scars  Lab Results  Component Value Date   WBC 6.0 12/15/2019   HGB 11.8 (L) 12/23/2019   HCT 39.2 12/23/2019   PLT 155 12/15/2019   GLUCOSE 131 (H) 12/24/2019   CHOL 189 07/03/2019   TRIG 76.0 07/03/2019   HDL 44.70 07/03/2019   LDLCALC 129 (H) 07/03/2019   ALT 13 11/13/2019   AST 14 11/13/2019   NA 135 12/24/2019   K 4.2 12/24/2019   CL 100 12/24/2019   CREATININE 1.79 (H) 12/24/2019   BUN 15 12/24/2019   CO2 29 12/24/2019   TSH 2.02 11/13/2019   PSA 10.49 (H) 06/27/2017    No results found.  Assessment & Plan:    Walker Kehr, MD

## 2020-01-14 ENCOUNTER — Encounter: Payer: Self-pay | Admitting: Internal Medicine

## 2020-01-14 ENCOUNTER — Other Ambulatory Visit: Payer: Self-pay | Admitting: Internal Medicine

## 2020-01-14 DIAGNOSIS — I10 Essential (primary) hypertension: Secondary | ICD-10-CM

## 2020-01-14 DIAGNOSIS — N289 Disorder of kidney and ureter, unspecified: Secondary | ICD-10-CM | POA: Insufficient documentation

## 2020-01-14 LAB — IRON,TIBC AND FERRITIN PANEL
%SAT: 28 % (calc) (ref 20–48)
Ferritin: 384 ng/mL — ABNORMAL HIGH (ref 24–380)
Iron: 71 ug/dL (ref 50–180)
TIBC: 257 mcg/dL (calc) (ref 250–425)

## 2020-01-14 MED ORDER — VALSARTAN-HYDROCHLOROTHIAZIDE 320-25 MG PO TABS
0.5000 | ORAL_TABLET | Freq: Every day | ORAL | 3 refills | Status: DC
Start: 2020-01-14 — End: 2020-01-16

## 2020-01-16 ENCOUNTER — Other Ambulatory Visit: Payer: Self-pay | Admitting: Internal Medicine

## 2020-01-16 MED ORDER — VALSARTAN 80 MG PO TABS: 80.0000 mg | ORAL_TABLET | Freq: Every day | ORAL | 3 refills | Status: DC

## 2020-01-20 DIAGNOSIS — C642 Malignant neoplasm of left kidney, except renal pelvis: Secondary | ICD-10-CM | POA: Diagnosis not present

## 2020-02-19 ENCOUNTER — Other Ambulatory Visit: Payer: Self-pay

## 2020-02-19 ENCOUNTER — Encounter: Payer: Self-pay | Admitting: Internal Medicine

## 2020-02-19 ENCOUNTER — Ambulatory Visit (INDEPENDENT_AMBULATORY_CARE_PROVIDER_SITE_OTHER): Payer: Medicare Other | Admitting: Internal Medicine

## 2020-02-19 VITALS — BP 122/78 | HR 76 | Temp 98.2°F | Ht 68.0 in | Wt 175.0 lb

## 2020-02-19 DIAGNOSIS — R739 Hyperglycemia, unspecified: Secondary | ICD-10-CM | POA: Diagnosis not present

## 2020-02-19 DIAGNOSIS — I10 Essential (primary) hypertension: Secondary | ICD-10-CM | POA: Diagnosis not present

## 2020-02-19 DIAGNOSIS — C642 Malignant neoplasm of left kidney, except renal pelvis: Secondary | ICD-10-CM | POA: Diagnosis not present

## 2020-02-19 DIAGNOSIS — N289 Disorder of kidney and ureter, unspecified: Secondary | ICD-10-CM

## 2020-02-19 LAB — BASIC METABOLIC PANEL
BUN: 21 mg/dL (ref 6–23)
CO2: 31 mEq/L (ref 19–32)
Calcium: 9.2 mg/dL (ref 8.4–10.5)
Chloride: 104 mEq/L (ref 96–112)
Creatinine, Ser: 2 mg/dL — ABNORMAL HIGH (ref 0.40–1.50)
GFR: 31.83 mL/min — ABNORMAL LOW (ref >60.00)
Glucose, Bld: 95 mg/dL (ref 70–99)
Potassium: 4.2 mEq/L (ref 3.5–5.1)
Sodium: 140 mEq/L (ref 135–145)

## 2020-02-19 LAB — HEMOGLOBIN A1C: Hgb A1c MFr Bld: 5.9 % (ref 4.6–6.5)

## 2020-02-19 MED ORDER — FLUTICASONE PROPIONATE 50 MCG/ACT NA SUSP: 2.0000 | Freq: Every day | NASAL | 3 refills | Status: DC

## 2020-02-19 MED ORDER — VALSARTAN 80 MG PO TABS
80.0000 mg | ORAL_TABLET | Freq: Every day | ORAL | 3 refills | Status: DC
Start: 2020-02-19 — End: 2020-02-22

## 2020-02-19 NOTE — Assessment & Plan Note (Signed)
Mild.  Check hemoglobin A1c

## 2020-02-19 NOTE — Patient Instructions (Addendum)
Wt Readings from Last 3 Encounters:  02/19/20 175 lb (79.4 kg)  01/13/20 169 lb 9.6 oz (76.9 kg)  12/22/19 170 lb 12.8 oz (77.5 kg)   For a mild COVID-19 case you can take zinc 50 mg a day for 1 week, vitamin C 1000 mg daily for 1 week, vitamin D2 50,000 units weekly for 2 months, Quercetin 500 mg twice a day for 1 week.  Maintain good oral hydration and take Tylenol with high fever. If you get short of breath or sick call [facility] COVID-19 hotline at [phone] [facility]).  You can buy COVID-19 express tests for home use. They are inexpensive, roughly $15 for 2 tests.  There is a link below:  [email or web address]

## 2020-02-19 NOTE — Assessment & Plan Note (Signed)
Follow-up with Dr. Diona Fanti and abdominal CT in April pending

## 2020-02-19 NOTE — Assessment & Plan Note (Addendum)
GFR 31 s/p nephrectomy Will obtain BMET today.  Good hydration discussed

## 2020-02-19 NOTE — Progress Notes (Signed)
Subjective:  Patient ID: Chad Palmer, male    DOB: Apr 13, 1945  Age: 75 y.o. MRN: [id number]  CC: Follow-up (1 month f/u)   HPI PEPPER WYNDHAM presents for a decreased GFR post-nephrectomy, hypertension, elevated glucose.  And has been taking Diovan, he gained a few pounds.  We discussed COVID prevention, treatment, booster.  Outpatient Medications Prior to Visit  Medication Sig Dispense Refill  . Bacillus Coagulans-Inulin (ALIGN PREBIOTIC-PROBIOTIC PO) Take 1 capsule by mouth at bedtime.    . Cholecalciferol (VITAMIN D3) 50 MCG (2000 UT) capsule Take 1 capsule (2,000 Units total) by mouth daily. 100 capsule 3  . Cyanocobalamin (B-12) 2500 MCG TABS Take 2,500 mcg by mouth daily.    . diclofenac sodium (VOLTAREN) 1 % GEL Apply 1 application topically 4 (four) times daily. (Patient taking differently: Apply 1 application topically 4 (four) times daily as needed (pain).) 100 g 3  . diphenhydrAMINE HCl (ZZZQUIL) 50 MG/30ML LIQD Take 50 mg by mouth at bedtime as needed (sleep).    Marland Kitchen esomeprazole (NEXIUM) 40 MG capsule Take 1 capsule shortly before breakfast each day. 90 capsule 3  . Melatonin 10 MG CAPS Take 10 mg by mouth at bedtime as needed (sleep).    . sildenafil (VIAGRA) 25 MG tablet Take 25 mg by mouth daily as needed for erectile dysfunction.    . traMADol (ULTRAM) 50 MG tablet Take 1-2 tablets (50-100 mg total) by mouth every 6 (six) hours as needed for moderate pain or severe pain. 20 tablet 0  . zolpidem (AMBIEN) 10 MG tablet Take 1 tablet (10 mg total) by mouth at bedtime. 90 tablet 1  . fluticasone (FLONASE) 50 MCG/ACT nasal spray Place 2 sprays into both nostrils daily. 48 g 3  . valsartan (DIOVAN) 80 MG tablet Take 1 tablet (80 mg total) by mouth daily. 90 tablet 3   No facility-administered medications prior to visit.    ROS: Review of Systems  Constitutional: Negative for appetite change, fatigue and unexpected weight change.  HENT: Negative for congestion, nosebleeds,  sneezing, sore throat and trouble swallowing.   Eyes: Negative for itching and visual disturbance.  Respiratory: Negative for cough.   Cardiovascular: Negative for chest pain, palpitations and leg swelling.  Gastrointestinal: Negative for abdominal distention, blood in stool, diarrhea and nausea.  Genitourinary: Negative for frequency and hematuria.  Musculoskeletal: Negative for back pain, gait problem, joint swelling and neck pain.  Skin: Negative for rash.  Neurological: Negative for dizziness, tremors, speech difficulty and weakness.  Psychiatric/Behavioral: Negative for agitation, dysphoric mood and sleep disturbance. The patient is not nervous/anxious.     Objective:  BP 122/78 (BP Location: Left Arm)   Pulse 76   Temp 98.2 F (36.8 C) (Oral)   Ht 5\' 8"  (1.727 m)   Wt 175 lb (79.4 kg)   SpO2 96%   BMI 26.61 kg/m   BP Readings from Last 3 Encounters:  02/19/20 122/78  01/13/20 128/78  12/24/19 (!) 167/99    Wt Readings from Last 3 Encounters:  02/19/20 175 lb (79.4 kg)  01/13/20 169 lb 9.6 oz (76.9 kg)  12/22/19 170 lb 12.8 oz (77.5 kg)    Physical Exam Constitutional:      General: He is not in acute distress.    Appearance: He is well-developed.     Comments: NAD  HENT:     Mouth/Throat:     Mouth: Oropharynx is clear and moist.  Eyes:     Conjunctiva/sclera: Conjunctivae normal.  Pupils: Pupils are equal, round, and reactive to light.  Neck:     Thyroid: No thyromegaly.     Vascular: No JVD.  Cardiovascular:     Rate and Rhythm: Normal rate and regular rhythm.     Pulses: Intact distal pulses.     Heart sounds: Normal heart sounds. No murmur heard. No friction rub. No gallop.   Pulmonary:     Effort: Pulmonary effort is normal. No respiratory distress.     Breath sounds: Normal breath sounds. No wheezing or rales.  Chest:     Chest wall: No tenderness.  Abdominal:     General: Bowel sounds are normal. There is no distension.     Palpations:  Abdomen is soft. There is no mass.     Tenderness: There is no abdominal tenderness. There is no guarding or rebound.  Musculoskeletal:        General: No tenderness or edema. Normal range of motion.     Cervical back: Normal range of motion.  Lymphadenopathy:     Cervical: No cervical adenopathy.  Skin:    General: Skin is warm and dry.     Findings: No rash.  Neurological:     Mental Status: He is alert and oriented to person, place, and time.     Cranial Nerves: No cranial nerve deficit.     Motor: No abnormal muscle tone.     Coordination: He displays a negative Romberg sign. Coordination normal.     Gait: Gait normal.     Deep Tendon Reflexes: Reflexes are normal and symmetric.  Psychiatric:        Mood and Affect: Mood and affect normal.        Behavior: Behavior normal.        Thought Content: Thought content normal.        Judgment: Judgment normal.     Lab Results  Component Value Date   WBC 7.1 01/13/2020   HGB 12.1 (L) 01/13/2020   HCT 38.8 (L) 01/13/2020   PLT 196.0 01/13/2020   GLUCOSE 126 (H) 01/13/2020   CHOL 189 07/03/2019   TRIG 76.0 07/03/2019   HDL 44.70 07/03/2019   LDLCALC 129 (H) 07/03/2019   ALT 10 01/13/2020   AST 11 01/13/2020   NA 139 01/13/2020   K 4.0 01/13/2020   CL 100 01/13/2020   CREATININE 2.01 (H) 01/13/2020   BUN 24 (H) 01/13/2020   CO2 33 (H) 01/13/2020   TSH 2.02 11/13/2019   PSA 10.49 (H) 06/27/2017    No results found.  Assessment & Plan:   There are no diagnoses linked to this encounter.   Meds ordered this encounter  Medications  . valsartan (DIOVAN) 80 MG tablet    Sig: Take 1 tablet (80 mg total) by mouth daily.    Dispense:  90 tablet    Refill:  3  . fluticasone (FLONASE) 50 MCG/ACT nasal spray    Sig: Place 2 sprays into both nostrils daily.    Dispense:  48 g    Refill:  3     Follow-up: No follow-ups on file.  Walker Kehr, MD

## 2020-02-19 NOTE — Assessment & Plan Note (Addendum)
Continue with Diovan.  Good blood pressure control.  Good hydration.  Obtain basic metabolic panel today

## 2020-02-22 ENCOUNTER — Other Ambulatory Visit: Payer: Self-pay | Admitting: Internal Medicine

## 2020-02-22 DIAGNOSIS — I10 Essential (primary) hypertension: Secondary | ICD-10-CM

## 2020-02-22 MED ORDER — VALSARTAN 80 MG PO TABS
40.0000 mg | ORAL_TABLET | Freq: Every day | ORAL | 3 refills | Status: DC
Start: 2020-02-22 — End: 2021-01-26

## 2020-03-23 DIAGNOSIS — Z961 Presence of intraocular lens: Secondary | ICD-10-CM | POA: Diagnosis not present

## 2020-03-30 ENCOUNTER — Ambulatory Visit (INDEPENDENT_AMBULATORY_CARE_PROVIDER_SITE_OTHER): Payer: Medicare Other | Admitting: Internal Medicine

## 2020-03-30 ENCOUNTER — Other Ambulatory Visit: Payer: Self-pay

## 2020-03-30 ENCOUNTER — Encounter: Payer: Self-pay | Admitting: Internal Medicine

## 2020-03-30 DIAGNOSIS — I10 Essential (primary) hypertension: Secondary | ICD-10-CM

## 2020-03-30 DIAGNOSIS — N183 Chronic kidney disease, stage 3 unspecified: Secondary | ICD-10-CM | POA: Diagnosis not present

## 2020-03-30 DIAGNOSIS — I2583 Coronary atherosclerosis due to lipid rich plaque: Secondary | ICD-10-CM

## 2020-03-30 DIAGNOSIS — R140 Abdominal distension (gaseous): Secondary | ICD-10-CM | POA: Diagnosis not present

## 2020-03-30 DIAGNOSIS — I2510 Atherosclerotic heart disease of native coronary artery without angina pectoris: Secondary | ICD-10-CM | POA: Diagnosis not present

## 2020-03-30 DIAGNOSIS — R634 Abnormal weight loss: Secondary | ICD-10-CM

## 2020-03-30 LAB — BASIC METABOLIC PANEL
BUN: 20 mg/dL (ref 6–23)
CO2: 31 mEq/L (ref 19–32)
Calcium: 9.3 mg/dL (ref 8.4–10.5)
Chloride: 104 mEq/L (ref 96–112)
Creatinine, Ser: 1.91 mg/dL — ABNORMAL HIGH (ref 0.40–1.50)
GFR: 33.62 mL/min — ABNORMAL LOW (ref >60.00)
Glucose, Bld: 89 mg/dL (ref 70–99)
Potassium: 4.4 mEq/L (ref 3.5–5.1)
Sodium: 141 mEq/L (ref 135–145)

## 2020-03-30 MED ORDER — METRONIDAZOLE 500 MG PO TABS: 500.0000 mg | ORAL_TABLET | Freq: Three times a day (TID) | ORAL | 0 refills | Status: DC

## 2020-03-30 MED ORDER — AMLODIPINE BESYLATE 2.5 MG PO TABS: 2.5000 mg | ORAL_TABLET | Freq: Every day | ORAL | 3 refills | Status: DC

## 2020-03-30 NOTE — Assessment & Plan Note (Signed)
Ed has been taking align daily.  Discontinue Align If not better-start Flagyl Activated charcoal as needed

## 2020-03-30 NOTE — Assessment & Plan Note (Signed)
Coronary calcium score of 180 Agatston units.  Consider statins.

## 2020-03-30 NOTE — Patient Instructions (Addendum)
Activated charcoal capsules as needed  Protonix, Aciphex

## 2020-03-30 NOTE — Assessment & Plan Note (Addendum)
Worse Add Norvasc.  Continue low-dose Diovan

## 2020-03-30 NOTE — Assessment & Plan Note (Signed)
BMET today Continue to hydrate well Nephrology referral

## 2020-03-30 NOTE — Assessment & Plan Note (Signed)
Resolved

## 2020-03-30 NOTE — Progress Notes (Signed)
Subjective:  Patient ID: Chad Palmer, male    DOB: 05/28/1945  Age: 75 y.o. MRN: [id number]  CC: Follow-up   HPI Chad Palmer presents for HTN, CRI SBP 140 at home C/o gas - taking Align  Outpatient Medications Prior to Visit  Medication Sig Dispense Refill  . Bacillus Coagulans-Inulin (ALIGN PREBIOTIC-PROBIOTIC PO) Take 1 capsule by mouth at bedtime.    . Cholecalciferol (VITAMIN D3) 50 MCG (2000 UT) capsule Take 1 capsule (2,000 Units total) by mouth daily. 100 capsule 3  . Cyanocobalamin (B-12) 2500 MCG TABS Take 2,500 mcg by mouth daily.    . diclofenac sodium (VOLTAREN) 1 % GEL Apply 1 application topically 4 (four) times daily. (Patient taking differently: Apply 1 application topically 4 (four) times daily as needed (pain).) 100 g 3  . diphenhydrAMINE HCl (ZZZQUIL) 50 MG/30ML LIQD Take 50 mg by mouth at bedtime as needed (sleep).    Marland Kitchen esomeprazole (NEXIUM) 40 MG capsule Take 1 capsule shortly before breakfast each day. 90 capsule 3  . fluticasone (FLONASE) 50 MCG/ACT nasal spray Place 2 sprays into both nostrils daily. 48 g 3  . Melatonin 10 MG CAPS Take 10 mg by mouth at bedtime as needed (sleep).    . sildenafil (VIAGRA) 25 MG tablet Take 25 mg by mouth daily as needed for erectile dysfunction.    . traMADol (ULTRAM) 50 MG tablet Take 1-2 tablets (50-100 mg total) by mouth every 6 (six) hours as needed for moderate pain or severe pain. 20 tablet 0  . valsartan (DIOVAN) 80 MG tablet Take 0.5 tablets (40 mg total) by mouth daily. 90 tablet 3  . zolpidem (AMBIEN) 10 MG tablet Take 1 tablet (10 mg total) by mouth at bedtime. 90 tablet 1   No facility-administered medications prior to visit.    ROS: Review of Systems  Constitutional: Negative for appetite change, fatigue and unexpected weight change.  HENT: Negative for congestion, nosebleeds, sneezing, sore throat and trouble swallowing.   Eyes: Negative for itching and visual disturbance.  Respiratory: Negative for  cough.   Cardiovascular: Negative for chest pain, palpitations and leg swelling.  Gastrointestinal: Positive for abdominal distention. Negative for blood in stool, diarrhea and nausea.  Genitourinary: Negative for frequency and hematuria.  Musculoskeletal: Negative for back pain, gait problem, joint swelling and neck pain.  Skin: Negative for rash.  Neurological: Negative for dizziness, tremors, speech difficulty and weakness.  Psychiatric/Behavioral: Negative for agitation, dysphoric mood and sleep disturbance. The patient is not nervous/anxious.     Objective:  BP (!) 142/90 (BP Location: Left Arm)   Pulse 84   Temp 98 F (36.7 C) (Oral)   Wt 175 lb 6.4 oz (79.6 kg)   SpO2 97%   BMI 26.67 kg/m   BP Readings from Last 3 Encounters:  03/30/20 (!) 142/90  02/19/20 122/78  01/13/20 128/78    Wt Readings from Last 3 Encounters:  03/30/20 175 lb 6.4 oz (79.6 kg)  02/19/20 175 lb (79.4 kg)  01/13/20 169 lb 9.6 oz (76.9 kg)    Physical Exam Constitutional:      General: He is not in acute distress.    Appearance: He is well-developed.     Comments: NAD  HENT:     Mouth/Throat:     Mouth: Oropharynx is clear and moist.  Eyes:     Conjunctiva/sclera: Conjunctivae normal.     Pupils: Pupils are equal, round, and reactive to light.  Neck:     Thyroid: No  thyromegaly.     Vascular: No JVD.  Cardiovascular:     Rate and Rhythm: Normal rate and regular rhythm.     Pulses: Intact distal pulses.     Heart sounds: Normal heart sounds. No murmur heard. No friction rub. No gallop.   Pulmonary:     Effort: Pulmonary effort is normal. No respiratory distress.     Breath sounds: Normal breath sounds. No wheezing or rales.  Chest:     Chest wall: No tenderness.  Abdominal:     General: Bowel sounds are normal. There is no distension.     Palpations: Abdomen is soft. There is no mass.     Tenderness: There is no abdominal tenderness. There is no guarding or rebound.   Musculoskeletal:        General: No tenderness or edema. Normal range of motion.     Cervical back: Normal range of motion.  Lymphadenopathy:     Cervical: No cervical adenopathy.  Skin:    General: Skin is warm and dry.     Findings: No rash.  Neurological:     Mental Status: He is alert and oriented to person, place, and time.     Cranial Nerves: No cranial nerve deficit.     Motor: No abnormal muscle tone.     Coordination: He displays a negative Romberg sign. Coordination normal.     Gait: Gait normal.     Deep Tendon Reflexes: Reflexes are normal and symmetric.  Psychiatric:        Mood and Affect: Mood and affect normal.        Behavior: Behavior normal.        Thought Content: Thought content normal.        Judgment: Judgment normal.     Lab Results  Component Value Date   WBC 7.1 01/13/2020   HGB 12.1 (L) 01/13/2020   HCT 38.8 (L) 01/13/2020   PLT 196.0 01/13/2020   GLUCOSE 95 02/19/2020   CHOL 189 07/03/2019   TRIG 76.0 07/03/2019   HDL 44.70 07/03/2019   LDLCALC 129 (H) 07/03/2019   ALT 10 01/13/2020   AST 11 01/13/2020   NA 140 02/19/2020   K 4.2 02/19/2020   CL 104 02/19/2020   CREATININE 2.00 (H) 02/19/2020   BUN 21 02/19/2020   CO2 31 02/19/2020   TSH 2.02 11/13/2019   PSA 10.49 (H) 06/27/2017   HGBA1C 5.9 02/19/2020    No results found.  Assessment & Plan:    Walker Kehr, MD

## 2020-04-27 DIAGNOSIS — C642 Malignant neoplasm of left kidney, except renal pelvis: Secondary | ICD-10-CM | POA: Diagnosis not present

## 2020-04-27 DIAGNOSIS — N1832 Chronic kidney disease, stage 3b: Secondary | ICD-10-CM | POA: Diagnosis not present

## 2020-04-27 DIAGNOSIS — D509 Iron deficiency anemia, unspecified: Secondary | ICD-10-CM | POA: Diagnosis not present

## 2020-04-27 DIAGNOSIS — K219 Gastro-esophageal reflux disease without esophagitis: Secondary | ICD-10-CM | POA: Diagnosis not present

## 2020-04-27 DIAGNOSIS — I129 Hypertensive chronic kidney disease with stage 1 through stage 4 chronic kidney disease, or unspecified chronic kidney disease: Secondary | ICD-10-CM | POA: Diagnosis not present

## 2020-06-01 DIAGNOSIS — C642 Malignant neoplasm of left kidney, except renal pelvis: Secondary | ICD-10-CM | POA: Diagnosis not present

## 2020-06-01 DIAGNOSIS — N400 Enlarged prostate without lower urinary tract symptoms: Secondary | ICD-10-CM | POA: Diagnosis not present

## 2020-06-01 DIAGNOSIS — I700 Atherosclerosis of aorta: Secondary | ICD-10-CM | POA: Diagnosis not present

## 2020-06-01 DIAGNOSIS — K5790 Diverticulosis of intestine, part unspecified, without perforation or abscess without bleeding: Secondary | ICD-10-CM | POA: Diagnosis not present

## 2020-06-01 DIAGNOSIS — I2510 Atherosclerotic heart disease of native coronary artery without angina pectoris: Secondary | ICD-10-CM | POA: Diagnosis not present

## 2020-06-01 DIAGNOSIS — J8410 Pulmonary fibrosis, unspecified: Secondary | ICD-10-CM | POA: Diagnosis not present

## 2020-06-01 DIAGNOSIS — K4090 Unilateral inguinal hernia, without obstruction or gangrene, not specified as recurrent: Secondary | ICD-10-CM | POA: Diagnosis not present

## 2020-06-09 DIAGNOSIS — C642 Malignant neoplasm of left kidney, except renal pelvis: Secondary | ICD-10-CM | POA: Diagnosis not present

## 2020-06-09 DIAGNOSIS — R3915 Urgency of urination: Secondary | ICD-10-CM | POA: Diagnosis not present

## 2020-06-09 DIAGNOSIS — R972 Elevated prostate specific antigen [PSA]: Secondary | ICD-10-CM | POA: Diagnosis not present

## 2020-06-09 DIAGNOSIS — N400 Enlarged prostate without lower urinary tract symptoms: Secondary | ICD-10-CM | POA: Diagnosis not present

## 2020-06-09 DIAGNOSIS — N401 Enlarged prostate with lower urinary tract symptoms: Secondary | ICD-10-CM | POA: Diagnosis not present

## 2020-07-07 ENCOUNTER — Telehealth: Payer: Self-pay | Admitting: Internal Medicine

## 2020-07-07 NOTE — Telephone Encounter (Signed)
LVM for pt to rtn my call to schedule AWV with [facility]. Please schedule AWV if pt calls the office  

## 2020-07-20 ENCOUNTER — Ambulatory Visit (INDEPENDENT_AMBULATORY_CARE_PROVIDER_SITE_OTHER): Payer: Medicare Other | Admitting: Internal Medicine

## 2020-07-20 ENCOUNTER — Ambulatory Visit (INDEPENDENT_AMBULATORY_CARE_PROVIDER_SITE_OTHER): Payer: Medicare Other

## 2020-07-20 ENCOUNTER — Other Ambulatory Visit: Payer: Self-pay

## 2020-07-20 ENCOUNTER — Other Ambulatory Visit: Payer: Self-pay | Admitting: Internal Medicine

## 2020-07-20 ENCOUNTER — Encounter: Payer: Self-pay | Admitting: Internal Medicine

## 2020-07-20 VITALS — BP 128/70 | HR 76 | Temp 98.1°F | Resp 16 | Ht 68.0 in | Wt 176.2 lb

## 2020-07-20 DIAGNOSIS — R634 Abnormal weight loss: Secondary | ICD-10-CM | POA: Diagnosis not present

## 2020-07-20 DIAGNOSIS — N401 Enlarged prostate with lower urinary tract symptoms: Secondary | ICD-10-CM | POA: Diagnosis not present

## 2020-07-20 DIAGNOSIS — N289 Disorder of kidney and ureter, unspecified: Secondary | ICD-10-CM | POA: Diagnosis not present

## 2020-07-20 DIAGNOSIS — N183 Chronic kidney disease, stage 3 unspecified: Secondary | ICD-10-CM

## 2020-07-20 DIAGNOSIS — Z0000 Encounter for general adult medical examination without abnormal findings: Secondary | ICD-10-CM | POA: Diagnosis not present

## 2020-07-20 DIAGNOSIS — I10 Essential (primary) hypertension: Secondary | ICD-10-CM

## 2020-07-20 LAB — COMPREHENSIVE METABOLIC PANEL
ALT: 10 U/L (ref 0–53)
AST: 12 U/L (ref 0–37)
Albumin: 4.4 g/dL (ref 3.5–5.2)
Alkaline Phosphatase: 72 U/L (ref 39–117)
BUN: 20 mg/dL (ref 6–23)
CO2: 29 mEq/L (ref 19–32)
Calcium: 9.2 mg/dL (ref 8.4–10.5)
Chloride: 105 mEq/L (ref 96–112)
Creatinine, Ser: 1.94 mg/dL — ABNORMAL HIGH (ref 0.40–1.50)
GFR: 32.92 mL/min — ABNORMAL LOW (ref >60.00)
Glucose, Bld: 100 mg/dL — ABNORMAL HIGH (ref 70–99)
Potassium: 3.9 mEq/L (ref 3.5–5.1)
Sodium: 141 mEq/L (ref 135–145)
Total Bilirubin: 1 mg/dL (ref 0.2–1.2)
Total Protein: 7 g/dL (ref 6.0–8.3)

## 2020-07-20 MED ORDER — FINASTERIDE 5 MG PO TABS: 5.0000 mg | ORAL_TABLET | Freq: Every day | ORAL | 3 refills | Status: AC

## 2020-07-20 MED ORDER — ZOLPIDEM TARTRATE 10 MG PO TABS
10.0000 mg | ORAL_TABLET | Freq: Every day | ORAL | 1 refills | Status: DC
Start: 2020-07-20 — End: 2020-08-13

## 2020-07-20 NOTE — Assessment & Plan Note (Signed)
Check BMET today

## 2020-07-20 NOTE — Assessment & Plan Note (Signed)
GFR 31 s/p nephrectomy

## 2020-07-20 NOTE — Progress Notes (Signed)
Subjective:  Patient ID: Chad Palmer, male    DOB: Apr 02, 1945  Age: 75 y.o. MRN: [id number]  CC: Follow-up (3 month f/u)   HPI Chad Palmer presents for CRF, HTN, BPH  Outpatient Medications Prior to Visit  Medication Sig Dispense Refill   amLODipine (NORVASC) 2.5 MG tablet Take 1 tablet (2.5 mg total) by mouth daily. 90 tablet 3   Bacillus Coagulans-Inulin (ALIGN PREBIOTIC-PROBIOTIC PO) Take 1 capsule by mouth at bedtime.     Cholecalciferol (VITAMIN D3) 50 MCG (2000 UT) capsule Take 1 capsule (2,000 Units total) by mouth daily. 100 capsule 3   Cyanocobalamin (B-12) 2500 MCG TABS Take 2,500 mcg by mouth daily.     diclofenac sodium (VOLTAREN) 1 % GEL Apply 1 application topically 4 (four) times daily. (Patient taking differently: Apply 1 application topically 4 (four) times daily as needed (pain).) 100 g 3   diphenhydrAMINE HCl (ZZZQUIL) 50 MG/30ML LIQD Take 50 mg by mouth at bedtime as needed (sleep).     esomeprazole (NEXIUM) 40 MG capsule Take 1 capsule shortly before breakfast each day. 90 capsule 3   fluticasone (FLONASE) 50 MCG/ACT nasal spray Place 2 sprays into both nostrils daily. 48 g 3   Melatonin 10 MG CAPS Take 10 mg by mouth at bedtime as needed (sleep).     sildenafil (VIAGRA) 25 MG tablet Take 25 mg by mouth daily as needed for erectile dysfunction.     traMADol (ULTRAM) 50 MG tablet Take 1-2 tablets (50-100 mg total) by mouth every 6 (six) hours as needed for moderate pain or severe pain. 20 tablet 0   valsartan (DIOVAN) 80 MG tablet Take 0.5 tablets (40 mg total) by mouth daily. 90 tablet 3   zolpidem (AMBIEN) 10 MG tablet Take 1 tablet (10 mg total) by mouth at bedtime. 90 tablet 1   metroNIDAZOLE (FLAGYL) 500 MG tablet Take 1 tablet (500 mg total) by mouth 3 (three) times daily. (Patient not taking: Reported on 07/20/2020) 30 tablet 0   No facility-administered medications prior to visit.    ROS: Review of Systems  Constitutional:  Negative for appetite  change, fatigue and unexpected weight change.  HENT:  Negative for congestion, nosebleeds, sneezing, sore throat and trouble swallowing.   Eyes:  Negative for itching and visual disturbance.  Respiratory:  Negative for cough.   Cardiovascular:  Negative for chest pain, palpitations and leg swelling.  Gastrointestinal:  Negative for abdominal distention, blood in stool, diarrhea and nausea.  Genitourinary:  Negative for frequency and hematuria.  Musculoskeletal:  Negative for back pain, gait problem, joint swelling and neck pain.  Skin:  Negative for rash.  Neurological:  Negative for dizziness, tremors, speech difficulty and weakness.  Psychiatric/Behavioral:  Negative for agitation, dysphoric mood and sleep disturbance. The patient is not nervous/anxious.    Objective:  BP 128/70 (BP Location: Left Arm)   Pulse 76   Temp 98.1 F (36.7 C) (Oral)   Wt 176 lb 4.8 oz (80 kg)   SpO2 98%   BMI 26.81 kg/m   BP Readings from Last 3 Encounters:  07/20/20 128/70  07/20/20 128/70  03/30/20 (!) 142/90    Wt Readings from Last 3 Encounters:  07/20/20 176 lb 4.8 oz (80 kg)  07/20/20 176 lb 3.2 oz (79.9 kg)  03/30/20 175 lb 6.4 oz (79.6 kg)    Physical Exam Constitutional:      General: He is not in acute distress.    Appearance: He is well-developed.  Comments: NAD  Eyes:     Conjunctiva/sclera: Conjunctivae normal.     Pupils: Pupils are equal, round, and reactive to light.  Neck:     Thyroid: No thyromegaly.     Vascular: No JVD.  Cardiovascular:     Rate and Rhythm: Normal rate and regular rhythm.     Heart sounds: Normal heart sounds. No murmur heard.   No friction rub. No gallop.  Pulmonary:     Effort: Pulmonary effort is normal. No respiratory distress.     Breath sounds: Normal breath sounds. No wheezing or rales.  Chest:     Chest wall: No tenderness.  Abdominal:     General: Bowel sounds are normal. There is no distension.     Palpations: Abdomen is soft. There  is no mass.     Tenderness: There is no abdominal tenderness. There is no guarding or rebound.  Musculoskeletal:        General: No tenderness. Normal range of motion.     Cervical back: Normal range of motion.  Lymphadenopathy:     Cervical: No cervical adenopathy.  Skin:    General: Skin is warm and dry.     Findings: No rash.  Neurological:     Mental Status: He is alert and oriented to person, place, and time.     Cranial Nerves: No cranial nerve deficit.     Motor: No abnormal muscle tone.     Coordination: Coordination normal.     Gait: Gait normal.     Deep Tendon Reflexes: Reflexes are normal and symmetric.  Psychiatric:        Behavior: Behavior normal.        Thought Content: Thought content normal.        Judgment: Judgment normal.    Lab Results  Component Value Date   WBC 7.1 01/13/2020   HGB 12.1 (L) 01/13/2020   HCT 38.8 (L) 01/13/2020   PLT 196.0 01/13/2020   GLUCOSE 89 03/30/2020   CHOL 189 07/03/2019   TRIG 76.0 07/03/2019   HDL 44.70 07/03/2019   LDLCALC 129 (H) 07/03/2019   ALT 10 01/13/2020   AST 11 01/13/2020   NA 141 03/30/2020   K 4.4 03/30/2020   CL 104 03/30/2020   CREATININE 1.91 (H) 03/30/2020   BUN 20 03/30/2020   CO2 31 03/30/2020   TSH 2.02 11/13/2019   PSA 10.49 (H) 06/27/2017   HGBA1C 5.9 02/19/2020    No results found.  Assessment & Plan:     Follow-up: No follow-ups on file.  Walker Kehr, MD

## 2020-07-20 NOTE — Assessment & Plan Note (Signed)
On Rapaflo - d/c'd We can try Proscar

## 2020-07-20 NOTE — Patient Instructions (Signed)
Chad Palmer , Thank you for taking time to come for your Medicare Wellness Visit. I appreciate your ongoing commitment to your health goals. Please review the following plan we discussed and let me know if I can assist you in the future.   Screening recommendations/referrals: Colonoscopy: 02/19/2019; due every 5 years Recommended yearly ophthalmology/optometry visit for glaucoma screening and checkup Recommended yearly dental visit for hygiene and checkup  Vaccinations: Influenza vaccine: 11/13/2019 Pneumococcal vaccine: 03/24/2013, 06/22/2015 Tdap vaccine: 05/16/2010; due every 10 years; patient can check with local pharmacy for vaccine. Shingles vaccine: never done   Covid-19:03/06/2019, 04/03/2019  Advanced directives: Please bring a copy of your health care power of attorney and living will to the office at your convenience.  Conditions/risks identified: Continue to control my weight by being more active.  Next appointment: Please schedule your next Medicare Wellness Visit with your Nurse Health Advisor in 1 year by calling [phone].  Preventive Care 32 Years and Older, Male Preventive care refers to lifestyle choices and visits with your health care provider that can promote health and wellness. What does preventive care include? A yearly physical exam. This is also called an annual well check. Dental exams once or twice a year. Routine eye exams. Ask your health care provider how often you should have your eyes checked. Personal lifestyle choices, including: Daily care of your teeth and gums. Regular physical activity. Eating a healthy diet. Avoiding tobacco and drug use. Limiting alcohol use. Practicing safe sex. Taking low doses of aspirin every day. Taking vitamin and mineral supplements as recommended by your health care provider. What happens during an annual well check? The services and screenings done by your health care provider during your annual well check will depend  on your age, overall health, lifestyle risk factors, and family history of disease. Counseling  Your health care provider may ask you questions about your: Alcohol use. Tobacco use. Drug use. Emotional well-being. Home and relationship well-being. Sexual activity. Eating habits. History of falls. Memory and ability to understand (cognition). Work and work Statistician. Screening  You may have the following tests or measurements: Height, weight, and BMI. Blood pressure. Lipid and cholesterol levels. These may be checked every 5 years, or more frequently if you are over 32 years old. Skin check. Lung cancer screening. You may have this screening every year starting at age 4 if you have a 30-pack-year history of smoking and currently smoke or have quit within the past 15 years. Fecal occult blood test (FOBT) of the stool. You may have this test every year starting at age 6. Flexible sigmoidoscopy or colonoscopy. You may have a sigmoidoscopy every 5 years or a colonoscopy every 10 years starting at age 51. Prostate cancer screening. Recommendations will vary depending on your family history and other risks. Hepatitis C blood test. Hepatitis B blood test. Sexually transmitted disease (STD) testing. Diabetes screening. This is done by checking your blood sugar (glucose) after you have not eaten for a while (fasting). You may have this done every 1-3 years. Abdominal aortic aneurysm (AAA) screening. You may need this if you are a current or former smoker. Osteoporosis. You may be screened starting at age 72 if you are at high risk. Talk with your health care provider about your test results, treatment options, and if necessary, the need for more tests. Vaccines  Your health care provider may recommend certain vaccines, such as: Influenza vaccine. This is recommended every year. Tetanus, diphtheria, and acellular pertussis (Tdap, Td) vaccine. You  may need a Td booster every 10  years. Zoster vaccine. You may need this after age 35. Pneumococcal 13-valent conjugate (PCV13) vaccine. One dose is recommended after age 69. Pneumococcal polysaccharide (PPSV23) vaccine. One dose is recommended after age 4. Talk to your health care provider about which screenings and vaccines you need and how often you need them. This information is not intended to replace advice given to you by your health care provider. Make sure you discuss any questions you have with your health care provider. Document Released: 02/19/2015 Document Revised: 10/13/2015 Document Reviewed: 11/24/2014 Elsevier Interactive Patient Education  2017 [employer] Prevention in the Home Falls can cause injuries. They can happen to people of all ages. There are many things you can do to make your home safe and to help prevent falls. What can I do on the outside of my home? Regularly fix the edges of walkways and driveways and fix any cracks. Remove anything that might make you trip as you walk through a door, such as a raised step or threshold. Trim any bushes or trees on the path to your home. Use bright outdoor lighting. Clear any walking paths of anything that might make someone trip, such as rocks or tools. Regularly check to see if handrails are loose or broken. Make sure that both sides of any steps have handrails. Any raised decks and porches should have guardrails on the edges. Have any leaves, snow, or ice cleared regularly. Use sand or salt on walking paths during winter. Clean up any spills in your garage right away. This includes oil or grease spills. What can I do in the bathroom? Use night lights. Install grab bars by the toilet and in the tub and shower. Do not use towel bars as grab bars. Use non-skid mats or decals in the tub or shower. If you need to sit down in the shower, use a plastic, non-slip stool. Keep the floor dry. Clean up any water that spills on the floor as soon as it  happens. Remove soap buildup in the tub or shower regularly. Attach bath mats securely with double-sided non-slip rug tape. Do not have throw rugs and other things on the floor that can make you trip. What can I do in the bedroom? Use night lights. Make sure that you have a light by your bed that is easy to reach. Do not use any sheets or blankets that are too big for your bed. They should not hang down onto the floor. Have a firm chair that has side arms. You can use this for support while you get dressed. Do not have throw rugs and other things on the floor that can make you trip. What can I do in the kitchen? Clean up any spills right away. Avoid walking on wet floors. Keep items that you use a lot in easy-to-reach places. If you need to reach something above you, use a strong step stool that has a grab bar. Keep electrical cords out of the way. Do not use floor polish or wax that makes floors slippery. If you must use wax, use non-skid floor wax. Do not have throw rugs and other things on the floor that can make you trip. What can I do with my stairs? Do not leave any items on the stairs. Make sure that there are handrails on both sides of the stairs and use them. Fix handrails that are broken or loose. Make sure that handrails are as long as the  stairways. Check any carpeting to make sure that it is firmly attached to the stairs. Fix any carpet that is loose or worn. Avoid having throw rugs at the top or bottom of the stairs. If you do have throw rugs, attach them to the floor with carpet tape. Make sure that you have a light switch at the top of the stairs and the bottom of the stairs. If you do not have them, ask someone to add them for you. What else can I do to help prevent falls? Wear shoes that: Do not have high heels. Have rubber bottoms. Are comfortable and fit you well. Are closed at the toe. Do not wear sandals. If you use a stepladder: Make sure that it is fully opened.  Do not climb a closed stepladder. Make sure that both sides of the stepladder are locked into place. Ask someone to hold it for you, if possible. Clearly mark and make sure that you can see: Any grab bars or handrails. First and last steps. Where the edge of each step is. Use tools that help you move around (mobility aids) if they are needed. These include: Canes. Walkers. Scooters. Crutches. Turn on the lights when you go into a dark area. Replace any light bulbs as soon as they burn out. Set up your furniture so you have a clear path. Avoid moving your furniture around. If any of your floors are uneven, fix them. If there are any pets around you, be aware of where they are. Review your medicines with your doctor. Some medicines can make you feel dizzy. This can increase your chance of falling. Ask your doctor what other things that you can do to help prevent falls. This information is not intended to replace advice given to you by your health care provider. Make sure you discuss any questions you have with your health care provider. Document Released: 11/19/2008 Document Revised: 07/01/2015 Document Reviewed: 02/27/2014 Elsevier Interactive Patient Education  2017 [employer].

## 2020-07-20 NOTE — Assessment & Plan Note (Signed)
Resolved

## 2020-07-20 NOTE — Telephone Encounter (Signed)
Check [place] registry last filled 04/26/2020.Marland KitchenJohny Chess

## 2020-07-20 NOTE — Assessment & Plan Note (Signed)
Cont w/Diovan, amlodipine

## 2020-07-20 NOTE — Progress Notes (Addendum)
Subjective:   Chad Palmer is a 75 y.o. male who presents for Medicare Annual/Subsequent preventive examination.  Review of Systems     Cardiac Risk Factors include: advanced age (>71men, >21 women);family history of premature cardiovascular disease;hypertension;male gender     Objective:    Today's Vitals   07/20/20 1212 07/20/20 1213  BP: 128/70   Pulse: 76   Resp: 16   Temp: 98.1 F (36.7 C)   TempSrc: Temporal   SpO2: 98%   Weight: 176 lb 3.2 oz (79.9 kg)   Height: 5\' 8"  (1.727 m)   PainSc: 0-No pain 0-No pain   Body mass index is 26.79 kg/m.  Advanced Directives 07/20/2020 12/22/2019 12/15/2019 07/04/2019 07/02/2018 06/22/2017  Does Patient Have a Medical Advance Directive? Yes Yes - Yes Yes Yes  Type of Advance Directive Living will;Healthcare Power of [place];Living will [place];Living will [place];Living will [place];Living will [place];Living will  Does patient want to make changes to medical advance directive? No - Patient declined No - Patient declined - No - Patient declined - -  Copy of [place] in Chart? No - copy requested - - No - copy requested No - copy requested No - copy requested    Current Medications (verified) Outpatient Encounter Medications as of 07/20/2020  Medication Sig   amLODipine (NORVASC) 2.5 MG tablet Take 1 tablet (2.5 mg total) by mouth daily.   Bacillus Coagulans-Inulin (ALIGN PREBIOTIC-PROBIOTIC PO) Take 1 capsule by mouth at bedtime.   Cholecalciferol (VITAMIN D3) 50 MCG (2000 UT) capsule Take 1 capsule (2,000 Units total) by mouth daily.   Cyanocobalamin (B-12) 2500 MCG TABS Take 2,500 mcg by mouth daily.   diclofenac sodium (VOLTAREN) 1 % GEL Apply 1 application topically 4 (four) times daily. (Patient taking differently: Apply 1 application topically 4 (four) times daily as needed (pain).)    diphenhydrAMINE HCl (ZZZQUIL) 50 MG/30ML LIQD Take 50 mg by mouth at bedtime as needed (sleep).   esomeprazole (NEXIUM) 40 MG capsule Take 1 capsule shortly before breakfast each day.   fluticasone (FLONASE) 50 MCG/ACT nasal spray Place 2 sprays into both nostrils daily.   Melatonin 10 MG CAPS Take 10 mg by mouth at bedtime as needed (sleep).   metroNIDAZOLE (FLAGYL) 500 MG tablet Take 1 tablet (500 mg total) by mouth 3 (three) times daily.   sildenafil (VIAGRA) 25 MG tablet Take 25 mg by mouth daily as needed for erectile dysfunction.   traMADol (ULTRAM) 50 MG tablet Take 1-2 tablets (50-100 mg total) by mouth every 6 (six) hours as needed for moderate pain or severe pain.   valsartan (DIOVAN) 80 MG tablet Take 0.5 tablets (40 mg total) by mouth daily.   zolpidem (AMBIEN) 10 MG tablet Take 1 tablet (10 mg total) by mouth at bedtime.   No facility-administered encounter medications on file as of 07/20/2020.    Allergies (verified) Levofloxacin and Omeprazole   History: Past Medical History:  Diagnosis Date   Allergic rhinitis    Arthritis    BPH (benign prostatic hyperplasia)    Cancer (HCC)    kidney    Cataract    bilateral    Cervical spinal stenosis    Dr Dohmier   Colon polyp    Diverticulosis of colon    ED (erectile dysfunction)    Elevated PSA    GERD (gastroesophageal reflux disease)    Hemorrhoids  HTN (hypertension)    Microcytosis    Past Surgical History:  Procedure Laterality Date   cataract surg     BIL   HEMORRHOID SURGERY     LAPAROSCOPIC NEPHRECTOMY Left 12/22/2019   Procedure: LAPAROSCOPIC RADICAL NEPHRECTOMY;  Surgeon: Raynelle Bring, MD;  Location: [facility];  Service: Urology;  Laterality: Left;   TOENAIL EXCISION  2009   ALL   TONSILLECTOMY     VASECTOMY     WISDOM TOOTH EXTRACTION     Family History  Problem Relation Age of Onset   Alzheimer's disease Mother    Heart disease Father    Alzheimer's disease Brother    Hypertension Other     Colon cancer Neg Hx    Esophageal cancer Neg Hx    Stomach cancer Neg Hx    Rectal cancer Neg Hx    Social History   Socioeconomic History   Marital status: Married    Spouse name: Not on file   Number of children: 2   Years of education: Not on file   Highest education level: Not on file  Occupational History   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  Tobacco Use   Smoking status: Never   Smokeless tobacco: Never  Scientific laboratory technician Use: Never used  Substance and Sexual Activity   Alcohol use: No    Alcohol/week: 0.0 standard drinks   Drug use: No   Sexual activity: Yes  Other Topics Concern   Not on file  Social History Narrative      Social Determinants of Health   Financial Resource Strain: Low Risk    Difficulty of Paying Living Expenses: Not hard at all  Food Insecurity: No Food Insecurity   Worried About Charity fundraiser in the Last Year: Never true   [place] in the Last Year: Never true  Transportation Needs: No Transportation Needs   Lack of Transportation (Medical): No   Lack of Transportation (Non-Medical): No  Physical Activity: Sufficiently Active   Days of Exercise per Week: 5 days   Minutes of Exercise per Session: 30 min  Stress: No Stress Concern Present   Feeling of Stress : Not at all  Social Connections: Socially Integrated   Frequency of Communication with Friends and Family: More than three times a week   Frequency of Social Gatherings with Friends and Family: More than three times a week   Attends Religious Services: 1 to 4 times per year   Active Member of [employer] or Organizations: No   Attends Music therapist: 1 to 4 times per year   Marital Status: Married    Tobacco Counseling Counseling given: Not Answered   Clinical Intake:  Pre-visit preparation completed: Yes  Pain : No/denies pain Pain Score: 0-No pain     BMI - recorded: 26.79 Nutritional Status: BMI 25 -29  Overweight Nutritional Risks: None Diabetes: No  How often do you need to have someone help you when you read instructions, pamphlets, or other written materials from your doctor or pharmacy?: 1 - Never What is the last grade level you completed in school?: Bachelor's Degree  Diabetic? no  Interpreter Needed?: No  Information entered by :: Lisette Abu, LPN   Activities of Daily Living In your present state of health, do you have any difficulty performing the following activities: 07/20/2020 12/22/2019  Hearing? Y N  Vision? N N  Difficulty concentrating or making decisions? N N  Walking  or climbing stairs? N N  Dressing or bathing? N N  Doing errands, shopping? N N  Preparing Food and eating ? N -  Using the Toilet? N -  In the past six months, have you accidently leaked urine? N -  Do you have problems with loss of bowel control? N -  Managing your Medications? N -  Managing your Finances? N -  Housekeeping or managing your Housekeeping? N -  Some recent data might be hidden    Patient Care Team: Plotnikov, Evie Lacks, MD as PCP - General Dahlstedt, Annie Main, MD (Urology) Rutherford Guys, MD (Ophthalmology) Sable Feil, MD as Attending Physician (Gastroenterology) Raynelle Bring, MD as Consulting Physician (Urology) Augustina Mood, DDS as Referring Physician (Dentistry)  Indicate any recent Medical Services you may have received from other than [facility] providers in the past year (date may be approximate).     Assessment:   This is a routine wellness examination for Chad Palmer.  Hearing/Vision screen Hearing Screening - Comments:: Patient has notice some hearing loss in both ears.  No hearing aids. Vision Screening - Comments:: Patient wears readers for small print; no other issues with vision.  Gets checked once a year by Dr. Rutherford Guys.  Dietary issues and exercise activities discussed: Current Exercise Habits: Home exercise routine, Type of exercise: walking,  Time (Minutes): 30, Frequency (Times/Week): 5, Weekly Exercise (Minutes/Week): 150, Intensity: Moderate, Exercise limited by: None identified   Goals Addressed               This Visit's Progress     Patient Stated (pt-stated)   On track     To maintain my current health status by continuing to eat healthy and stay physically active & socially active.       Depression Screen PHQ 2/9 Scores 07/20/2020 07/03/2019 07/02/2018 06/22/2017 06/22/2015 03/25/2014  PHQ - 2 Score 0 0 0 0 0 0    Fall Risk Fall Risk  07/04/2019 07/03/2019 07/02/2018 06/22/2017 08/29/2016  Falls in the past year? 0 0 0 Yes No  Comment - - - - Emmi Telephone Survey: data to providers prior to load  Number falls in past yr: 0 0 0 1 -  Injury with Fall? 0 0 - No -  Risk for fall due to : No Fall Risks - - - -  Follow up Falls evaluation completed;Education provided - - Falls prevention discussed -    FALL RISK PREVENTION PERTAINING TO THE HOME:  Any stairs in or around the home? Yes  If so, are there any without handrails? No  Home free of loose throw rugs in walkways, pet beds, electrical cords, etc? Yes  Adequate lighting in your home to reduce risk of falls? Yes   ASSISTIVE DEVICES UTILIZED TO PREVENT FALLS:  Life alert? No  Use of a cane, walker or w/c? No  Grab bars in the bathroom? Yes  Shower chair or bench in shower? Yes  Elevated toilet seat or a handicapped toilet? Yes   TIMED UP AND GO:  Was the test performed? Yes .  Length of time to ambulate 10 feet: 6 sec.   Gait steady and fast without use of assistive device  Cognitive Function: MMSE - Mini Mental State Exam 07/02/2018 06/22/2017  Orientation to time 5 5  Orientation to Place 5 5  Registration 3 3  Attention/ Calculation 5 5  Recall 3 3  Language- name 2 objects 2 2  Language- repeat 1 1  Language- follow 3 step  command 3 3  Language- read & follow direction 1 1  Write a sentence 1 1  Copy design 1 1  Total score 30 30         Immunizations Immunization History  Administered Date(s) Administered   Influenza, High Dose Seasonal PF 12/12/2014, 12/11/2017, 12/11/2017   Influenza,inj,Quad PF,6+ Mos 11/28/2016   Influenza-Unspecified 11/06/2012, 11/06/2013, 11/13/2019   Moderna Sars-Covid-2 Vaccination 03/06/2019, 04/03/2019   Pneumococcal Conjugate-13 03/24/2013   Pneumococcal Polysaccharide-23 04/23/2009, 06/22/2015   Td 05/16/2010   Zoster, Live 09/06/2007    TDAP status: Due, Education has been provided regarding the importance of this vaccine. Advised may receive this vaccine at local pharmacy or Health Dept. Aware to provide a copy of the vaccination record if obtained from local pharmacy or Health Dept. Verbalized acceptance and understanding.  Flu Vaccine status: Up to date  Pneumococcal vaccine status: Up to date  Covid-19 vaccine status: Completed vaccines  Qualifies for Shingles Vaccine? Yes   Zostavax completed Yes   Shingrix Completed?: No.    Education has been provided regarding the importance of this vaccine. Patient has been advised to call insurance company to determine out of pocket expense if they have not yet received this vaccine. Advised may also receive vaccine at local pharmacy or Health Dept. Verbalized acceptance and understanding.  Screening Tests Health Maintenance  Topic Date Due   Zoster Vaccines- Shingrix (1 of 2) Never done   COVID-19 Vaccine (3 - Moderna risk series) 05/01/2019   TETANUS/TDAP  05/15/2020   INFLUENZA VACCINE  09/06/2020   COLONOSCOPY (Pts 45-29yrs Insurance coverage will need to be confirmed)  02/19/2024   Hepatitis C Screening  Completed   PNA vac Low Risk Adult  Completed   HPV VACCINES  Aged Out    Health Maintenance  Health Maintenance Due  Topic Date Due   Zoster Vaccines- Shingrix (1 of 2) Never done   COVID-19 Vaccine (3 - Moderna risk series) 05/01/2019   TETANUS/TDAP  05/15/2020    Colorectal cancer screening: Type of screening:  Colonoscopy. Completed 02/19/2019. Repeat every 5 years  Lung Cancer Screening: (Low Dose CT Chest recommended if Age 16-80 years, 30 pack-year currently smoking OR have quit w/in 15years.) does not qualify.   Lung Cancer Screening Referral: no  Additional Screening:  Hepatitis C Screening: does qualify; Completed yes  Vision Screening: Recommended annual ophthalmology exams for early detection of glaucoma and other disorders of the eye. Is the patient up to date with their annual eye exam?  Yes  Who is the provider or what is the name of the office in which the patient attends annual eye exams? Rutherford Guys, MD. If pt is not established with a provider, would they like to be referred to a provider to establish care? No .   Dental Screening: Recommended annual dental exams for proper oral hygiene  Community Resource Referral / Chronic Care Management: CRR required this visit?  No   CCM required this visit?  No      Plan:     I have personally reviewed and noted the following in the patient's chart:   Medical and social history Use of alcohol, tobacco or illicit drugs  Current medications and supplements including opioid prescriptions. Patient is not currently taking opioid prescriptions. Functional ability and status Nutritional status Physical activity Advanced directives List of other physicians Hospitalizations, surgeries, and ER visits in previous 12 months Vitals Screenings to include cognitive, depression, and falls Referrals and appointments  In addition, I have  reviewed and discussed with patient certain preventive protocols, quality metrics, and best practice recommendations. A written personalized care plan for preventive services as well as general preventive health recommendations were provided to patient.     Sheral Flow, LPN   04/02/8344   Nurse Notes: n/a  Medical screening examination/treatment/procedure(s) were performed by non-physician  practitioner and as supervising physician I was immediately available for consultation/collaboration.  I agree with above. Lew Dawes, MD

## 2020-08-13 ENCOUNTER — Other Ambulatory Visit: Payer: Self-pay | Admitting: Internal Medicine

## 2020-08-13 ENCOUNTER — Telehealth: Payer: Self-pay | Admitting: Internal Medicine

## 2020-08-13 DIAGNOSIS — I10 Essential (primary) hypertension: Secondary | ICD-10-CM | POA: Diagnosis not present

## 2020-08-13 DIAGNOSIS — R519 Headache, unspecified: Secondary | ICD-10-CM | POA: Diagnosis not present

## 2020-08-13 DIAGNOSIS — J029 Acute pharyngitis, unspecified: Secondary | ICD-10-CM | POA: Diagnosis not present

## 2020-08-13 DIAGNOSIS — R0981 Nasal congestion: Secondary | ICD-10-CM | POA: Diagnosis not present

## 2020-08-13 DIAGNOSIS — M791 Myalgia, unspecified site: Secondary | ICD-10-CM | POA: Diagnosis not present

## 2020-08-13 DIAGNOSIS — N189 Chronic kidney disease, unspecified: Secondary | ICD-10-CM | POA: Diagnosis not present

## 2020-08-13 DIAGNOSIS — U071 COVID-19: Secondary | ICD-10-CM | POA: Diagnosis not present

## 2020-08-13 NOTE — Telephone Encounter (Signed)
Okay to send molnupiravir prescription for COVID to his pharmacy in [place].  Go to ER if problems.  See below. Thanks

## 2020-08-13 NOTE — Telephone Encounter (Signed)
I can, but I do not know where.  Let me know.  Have a good weekend!

## 2020-08-13 NOTE — Telephone Encounter (Signed)
[facility]   Caller states he is up in [place] and just tested positive for Covid. He have a fever, sore throat and body aches. Last temp 100.0 forehead. Symptoms started today.He would like the doctor to call him in something where he is located.  Advised to see HCP/PCP within 4 hours or go to nearest ED or [facility]. Caller understood but didn't know what to do.   Please advise.

## 2020-08-15 NOTE — Telephone Encounter (Signed)
It is taking care of.  He went to ER.  Thanks

## 2020-09-24 DIAGNOSIS — N401 Enlarged prostate with lower urinary tract symptoms: Secondary | ICD-10-CM | POA: Diagnosis not present

## 2020-10-29 DIAGNOSIS — N1832 Chronic kidney disease, stage 3b: Secondary | ICD-10-CM | POA: Diagnosis not present

## 2020-10-29 DIAGNOSIS — Z23 Encounter for immunization: Secondary | ICD-10-CM | POA: Diagnosis not present

## 2020-10-29 DIAGNOSIS — I129 Hypertensive chronic kidney disease with stage 1 through stage 4 chronic kidney disease, or unspecified chronic kidney disease: Secondary | ICD-10-CM | POA: Diagnosis not present

## 2020-10-29 DIAGNOSIS — K219 Gastro-esophageal reflux disease without esophagitis: Secondary | ICD-10-CM | POA: Diagnosis not present

## 2020-10-29 DIAGNOSIS — C642 Malignant neoplasm of left kidney, except renal pelvis: Secondary | ICD-10-CM | POA: Diagnosis not present

## 2020-11-15 DIAGNOSIS — R809 Proteinuria, unspecified: Secondary | ICD-10-CM | POA: Diagnosis not present

## 2020-12-08 ENCOUNTER — Ambulatory Visit (HOSPITAL_COMMUNITY)
Admission: RE | Admit: 2020-12-08 | Discharge: 2020-12-08 | Disposition: A | Discharge status: Home / Self Care | Payer: Medicare Other | Source: Ambulatory Visit | Attending: Urology | Admitting: Urology

## 2020-12-08 ENCOUNTER — Other Ambulatory Visit (HOSPITAL_COMMUNITY): Payer: Self-pay | Admitting: Urology

## 2020-12-08 DIAGNOSIS — Z85528 Personal history of other malignant neoplasm of kidney: Secondary | ICD-10-CM | POA: Insufficient documentation

## 2020-12-08 DIAGNOSIS — R911 Solitary pulmonary nodule: Secondary | ICD-10-CM | POA: Diagnosis not present

## 2020-12-09 DIAGNOSIS — N1832 Chronic kidney disease, stage 3b: Secondary | ICD-10-CM | POA: Diagnosis not present

## 2020-12-10 DIAGNOSIS — R972 Elevated prostate specific antigen [PSA]: Secondary | ICD-10-CM | POA: Diagnosis not present

## 2020-12-10 DIAGNOSIS — R3915 Urgency of urination: Secondary | ICD-10-CM | POA: Diagnosis not present

## 2020-12-10 DIAGNOSIS — N401 Enlarged prostate with lower urinary tract symptoms: Secondary | ICD-10-CM | POA: Diagnosis not present

## 2020-12-10 DIAGNOSIS — Z85528 Personal history of other malignant neoplasm of kidney: Secondary | ICD-10-CM | POA: Diagnosis not present

## 2020-12-27 DIAGNOSIS — R972 Elevated prostate specific antigen [PSA]: Secondary | ICD-10-CM | POA: Diagnosis not present

## 2021-01-26 ENCOUNTER — Other Ambulatory Visit: Payer: Self-pay

## 2021-01-26 ENCOUNTER — Encounter: Payer: Self-pay | Admitting: Internal Medicine

## 2021-01-26 ENCOUNTER — Ambulatory Visit (INDEPENDENT_AMBULATORY_CARE_PROVIDER_SITE_OTHER): Payer: Medicare Other | Admitting: Internal Medicine

## 2021-01-26 VITALS — BP 120/82 | HR 76 | Temp 98.1°F | Ht 68.0 in | Wt 175.4 lb

## 2021-01-26 DIAGNOSIS — I2510 Atherosclerotic heart disease of native coronary artery without angina pectoris: Secondary | ICD-10-CM | POA: Diagnosis not present

## 2021-01-26 DIAGNOSIS — N183 Chronic kidney disease, stage 3 unspecified: Secondary | ICD-10-CM

## 2021-01-26 DIAGNOSIS — I10 Essential (primary) hypertension: Secondary | ICD-10-CM

## 2021-01-26 DIAGNOSIS — E785 Hyperlipidemia, unspecified: Secondary | ICD-10-CM | POA: Diagnosis not present

## 2021-01-26 DIAGNOSIS — I2583 Coronary atherosclerosis due to lipid rich plaque: Secondary | ICD-10-CM

## 2021-01-26 LAB — COMPREHENSIVE METABOLIC PANEL
ALT: 15 U/L (ref 0–53)
AST: 18 U/L (ref 0–37)
Albumin: 4.4 g/dL (ref 3.5–5.2)
Alkaline Phosphatase: 71 U/L (ref 39–117)
BUN: 28 mg/dL — ABNORMAL HIGH (ref 6–23)
CO2: 32 mEq/L (ref 19–32)
Calcium: 9.8 mg/dL (ref 8.4–10.5)
Chloride: 100 mEq/L (ref 96–112)
Creatinine, Ser: 2.24 mg/dL — ABNORMAL HIGH (ref 0.40–1.50)
GFR: 27.6 mL/min — ABNORMAL LOW (ref >60.00)
Glucose, Bld: 95 mg/dL (ref 70–99)
Potassium: 4.8 mEq/L (ref 3.5–5.1)
Sodium: 138 mEq/L (ref 135–145)
Total Bilirubin: 1 mg/dL (ref 0.2–1.2)
Total Protein: 7.4 g/dL (ref 6.0–8.3)

## 2021-01-26 LAB — LIPID PANEL
Cholesterol: 202 mg/dL — ABNORMAL HIGH (ref 0–200)
HDL: 45.1 mg/dL (ref >39.00)
LDL Cholesterol: 132 mg/dL — ABNORMAL HIGH (ref 0–99)
NonHDL: 157.23
Total CHOL/HDL Ratio: 4
Triglycerides: 127 mg/dL (ref 0.0–149.0)
VLDL: 25.4 mg/dL (ref 0.0–40.0)

## 2021-01-26 MED ORDER — ZOLPIDEM TARTRATE 10 MG PO TABS: 10.0000 mg | ORAL_TABLET | Freq: Every day | ORAL | 1 refills | Status: DC

## 2021-01-26 MED ORDER — FISH OIL 1000 MG PO CAPS: 2.0000 | ORAL_CAPSULE | Freq: Every day | ORAL | 3 refills | Status: DC

## 2021-01-26 MED ORDER — OLMESARTAN MEDOXOMIL-HCTZ 20-12.5 MG PO TABS: 1.0000 | ORAL_TABLET | Freq: Every day | ORAL | 3 refills | Status: DC

## 2021-01-26 NOTE — Assessment & Plan Note (Signed)
Coronary calcium score of 180 Agatston units.

## 2021-01-26 NOTE — Progress Notes (Signed)
Subjective:  Patient ID: Chad Palmer, male    DOB: [birth year]  Age: 75 y.o. MRN: [id number]  CC: Follow-up and Medication Refill (Pt states he need printed 90 day for olmesartan)   HPI Chad Palmer presents for CRI, HTN, insomnia, elevated PSA 16  Outpatient Medications Prior to Visit  Medication Sig Dispense Refill   olmesartan-hydrochlorothiazide (BENICAR HCT) 20-12.5 MG tablet Take 1 tablet by mouth daily.     Bacillus Coagulans-Inulin (ALIGN PREBIOTIC-PROBIOTIC PO) Take 1 capsule by mouth at bedtime.     Cholecalciferol (VITAMIN D3) 50 MCG (2000 UT) capsule Take 1 capsule (2,000 Units total) by mouth daily. 100 capsule 3   Cyanocobalamin (B-12) 2500 MCG TABS Take 2,500 mcg by mouth daily.     diclofenac sodium (VOLTAREN) 1 % GEL Apply 1 application topically 4 (four) times daily. (Patient taking differently: Apply 1 application topically 4 (four) times daily as needed (pain).) 100 g 3   diphenhydrAMINE HCl (ZZZQUIL) 50 MG/30ML LIQD Take 50 mg by mouth at bedtime as needed (sleep).     esomeprazole (NEXIUM) 40 MG capsule Take 1 capsule shortly before breakfast each day. 90 capsule 3   finasteride (PROSCAR) 5 MG tablet Take 1 tablet (5 mg total) by mouth daily. 100 tablet 3   fluticasone (FLONASE) 50 MCG/ACT nasal spray Place 2 sprays into both nostrils daily. 48 g 3   Melatonin 10 MG CAPS Take 10 mg by mouth at bedtime as needed (sleep).     sildenafil (VIAGRA) 25 MG tablet Take 25 mg by mouth daily as needed for erectile dysfunction.     traMADol (ULTRAM) 50 MG tablet Take 1-2 tablets (50-100 mg total) by mouth every 6 (six) hours as needed for moderate pain or severe pain. 20 tablet 0   amLODipine (NORVASC) 2.5 MG tablet Take 1 tablet (2.5 mg total) by mouth daily. 90 tablet 3   valsartan (DIOVAN) 80 MG tablet Take 0.5 tablets (40 mg total) by mouth daily. (Patient not taking: Reported on 01/26/2021) 90 tablet 3   zolpidem (AMBIEN) 10 MG tablet TAKE ONE TABLET BY MOUTH EVERY  NIGHT AT BEDTIME 90 tablet 1   No facility-administered medications prior to visit.    ROS: Review of Systems  Constitutional:  Negative for appetite change, fatigue and unexpected weight change.  HENT:  Negative for congestion, nosebleeds, sneezing, sore throat and trouble swallowing.   Eyes:  Negative for itching and visual disturbance.  Respiratory:  Negative for cough.   Cardiovascular:  Negative for chest pain, palpitations and leg swelling.  Gastrointestinal:  Negative for abdominal distention, blood in stool, diarrhea and nausea.  Genitourinary:  Negative for frequency and hematuria.  Musculoskeletal:  Negative for back pain, gait problem, joint swelling and neck pain.  Skin:  Negative for rash.  Neurological:  Negative for dizziness, tremors, speech difficulty and weakness.  Psychiatric/Behavioral:  Positive for sleep disturbance. Negative for agitation, dysphoric mood and suicidal ideas. The patient is not nervous/anxious.    Objective:  BP 120/82 (BP Location: Left Arm)    Pulse 76    Temp 98.1 F (36.7 C) (Oral)    Ht 5\' 8"  (1.727 m)    Wt 175 lb 6.4 oz (79.6 kg)    SpO2 98%    BMI 26.67 kg/m   BP Readings from Last 3 Encounters:  01/26/21 120/82  07/20/20 128/70  07/20/20 128/70    Wt Readings from Last 3 Encounters:  01/26/21 175 lb 6.4 oz (79.6 kg)  07/20/20 176  lb 4.8 oz (80 kg)  07/20/20 176 lb 3.2 oz (79.9 kg)    Physical Exam Constitutional:      General: He is not in acute distress.    Appearance: He is well-developed.     Comments: NAD  Eyes:     Conjunctiva/sclera: Conjunctivae normal.     Pupils: Pupils are equal, round, and reactive to light.  Neck:     Thyroid: No thyromegaly.     Vascular: No JVD.  Cardiovascular:     Rate and Rhythm: Normal rate and regular rhythm.     Heart sounds: Normal heart sounds. No murmur heard.   No friction rub. No gallop.  Pulmonary:     Effort: Pulmonary effort is normal. No respiratory distress.     Breath  sounds: Normal breath sounds. No wheezing or rales.  Chest:     Chest wall: No tenderness.  Abdominal:     General: Bowel sounds are normal. There is no distension.     Palpations: Abdomen is soft. There is no mass.     Tenderness: There is no abdominal tenderness. There is no guarding or rebound.  Musculoskeletal:        General: No tenderness. Normal range of motion.     Cervical back: Normal range of motion.  Lymphadenopathy:     Cervical: No cervical adenopathy.  Skin:    General: Skin is warm and dry.     Findings: No rash.  Neurological:     Mental Status: He is alert and oriented to person, place, and time.     Cranial Nerves: No cranial nerve deficit.     Motor: No abnormal muscle tone.     Coordination: Coordination normal.     Gait: Gait normal.     Deep Tendon Reflexes: Reflexes are normal and symmetric.  Psychiatric:        Behavior: Behavior normal.        Thought Content: Thought content normal.        Judgment: Judgment normal.    Lab Results  Component Value Date   WBC 7.1 01/13/2020   HGB 12.1 (L) 01/13/2020   HCT 38.8 (L) 01/13/2020   PLT 196.0 01/13/2020   GLUCOSE 95 01/26/2021   CHOL 202 (H) 01/26/2021   TRIG 127.0 01/26/2021   HDL 45.10 01/26/2021   LDLCALC 132 (H) 01/26/2021   ALT 15 01/26/2021   AST 18 01/26/2021   NA 138 01/26/2021   K 4.8 01/26/2021   CL 100 01/26/2021   CREATININE 2.24 (H) 01/26/2021   BUN 28 (H) 01/26/2021   CO2 32 01/26/2021   TSH 2.02 11/13/2019   PSA 10.49 (H) 06/27/2017   HGBA1C 5.9 02/19/2020    DG Chest 2 View  Result Date: 12/08/2020 CLINICAL DATA:  Renal malignancy EXAM: CHEST - 2 VIEW COMPARISON:  04/22/2015 known pulmonary nodules FINDINGS: The heart size and mediastinal contours are within normal limits. Both lungs are clear. Known pulmonary nodules are not well seen on this radiograph due to their small size. The visualized skeletal structures are unremarkable. IMPRESSION: No acute cardiopulmonary disease.  Electronically Signed   By: Miachel Roux M.D.   On: 12/08/2020 09:31    Assessment & Plan:   Problem List Items Addressed This Visit     Coronary atherosclerosis    Coronary calcium score of 180 Agatston units.      Relevant Medications   olmesartan-hydrochlorothiazide (BENICAR HCT) 20-12.5 MG tablet   CRI (chronic renal insufficiency), stage 3 (moderate) (  [place])    F/u w/Nephrology - Dr Joylene Grapes Monitor GFR      Relevant Orders   Comprehensive metabolic panel (Completed)   Lipid panel (Completed)   Essential hypertension    On Benicar HCT Dr Joylene Grapes      Relevant Medications   olmesartan-hydrochlorothiazide (BENICAR HCT) 20-12.5 MG tablet   Other Visit Diagnoses     Dyslipidemia    -  Primary   Relevant Orders   Lipid panel (Completed)         Meds ordered this encounter  Medications   olmesartan-hydrochlorothiazide (BENICAR HCT) 20-12.5 MG tablet    Sig: Take 1 tablet by mouth daily.    Dispense:  90 tablet    Refill:  3   zolpidem (AMBIEN) 10 MG tablet    Sig: Take 1 tablet (10 mg total) by mouth at bedtime.    Dispense:  90 tablet    Refill:  1   Omega-3 Fatty Acids (FISH OIL) 1000 MG CAPS    Sig: Take 2 capsules (2,000 mg total) by mouth daily.    Dispense:  100 capsule    Refill:  3      Follow-up: Return in about 6 months (around 07/27/2021) for Wellness Exam.  Walker Kehr, MD

## 2021-01-26 NOTE — Assessment & Plan Note (Signed)
On Benicar HCT Dr Joylene Grapes

## 2021-01-26 NOTE — Assessment & Plan Note (Signed)
F/u w/Nephrology - Dr Joylene Grapes Monitor GFR

## 2021-02-07 ENCOUNTER — Encounter: Payer: Self-pay | Admitting: Internal Medicine

## 2021-03-29 DIAGNOSIS — Z961 Presence of intraocular lens: Secondary | ICD-10-CM | POA: Diagnosis not present

## 2021-04-06 ENCOUNTER — Other Ambulatory Visit: Payer: Self-pay | Admitting: Internal Medicine

## 2021-04-07 IMAGING — CT CT CARDIAC CORONARY ARTERY CALCIUM SCORE
3 series · 14 of 20 positions shown, 15 images · non-contrast
Comparison: Chest CT 05/24/2010.
COMPARISON: Chest CT 05/24/2010.

Addendum:
EXAM:
OVER-READ INTERPRETATION  CT CHEST

The following report is an over-read performed by radiologist Dr.
Nosa Tiger [REDACTED] on 07/15/2019. This
over-read does not include interpretation of cardiac or coronary
anatomy or pathology. The coronary calcium score interpretation by
the cardiologist is attached.
CLINICAL DATA: Risk stratification
Coronary Calcium Score
TECHNIQUE: The patient was scanned on a Siemens Sensation 16 slice scanner.
Axial non-contrast 3mm slices were carried out through the heart.
The data set was analyzed on a dedicated work station and scored
using the Agatson method.

[Series 2: casc 3.0 bv41 2 bestdiast 70 % · axial · 0.36mm/px · z∈[+1172,+1241]mm · 4 of 39 slices shown, 5 images]
[im 8/39  vessel]
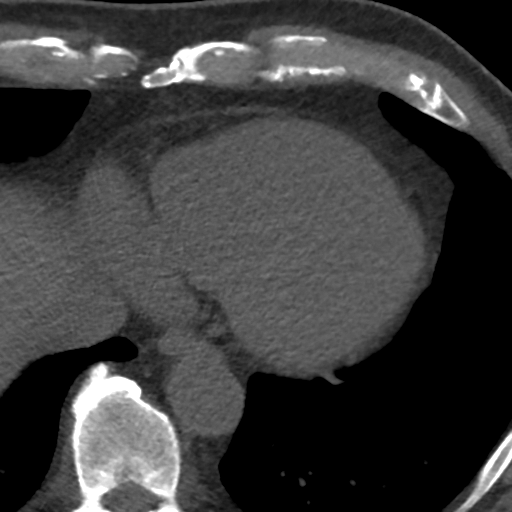
[im 8/39  lung]
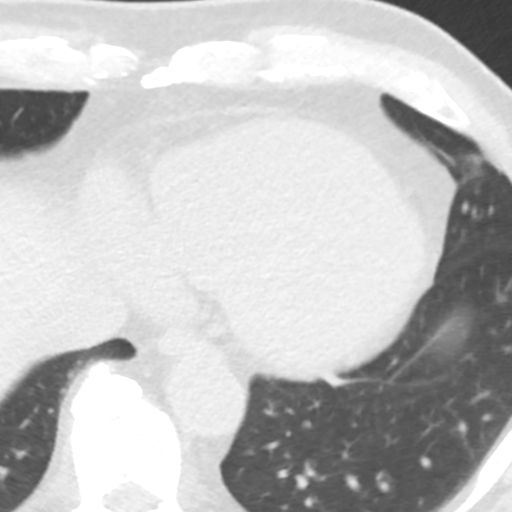
[im 16/39  vessel]
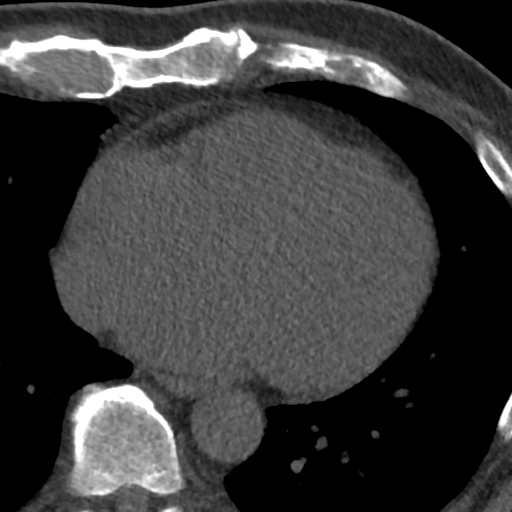
[im 23/39  vessel]
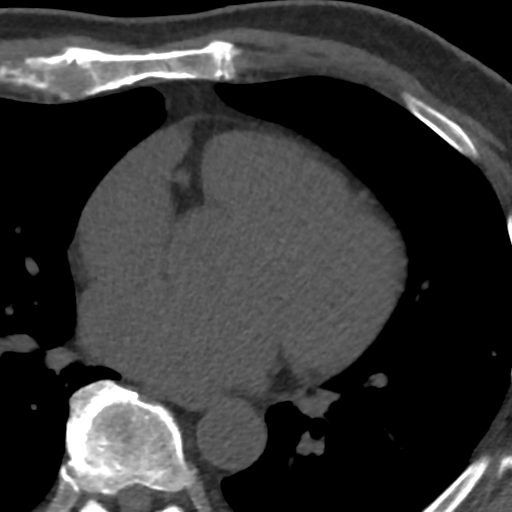
[im 31/39  vessel]
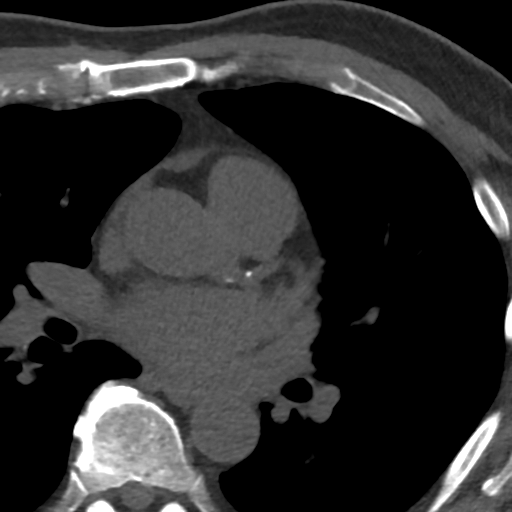

[Series 3: lung 69 % · axial · 0.64mm/px · z∈[+1166,+1244]mm · 5 of 40 slices shown]
[im 7/40  lung]
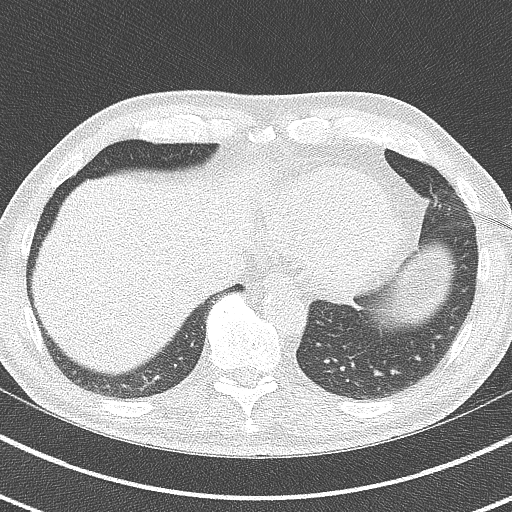
[im 14/40  lung]
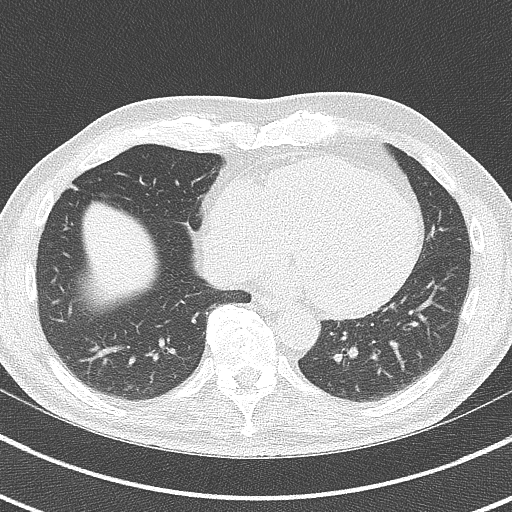
[im 20/40  lung]
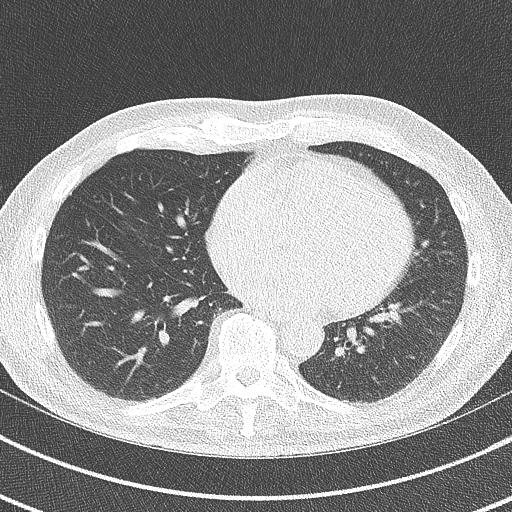
[im 27/40  lung]
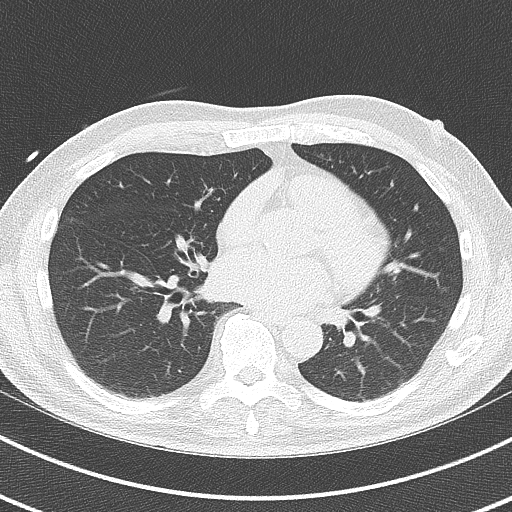
[im 33/40  lung]
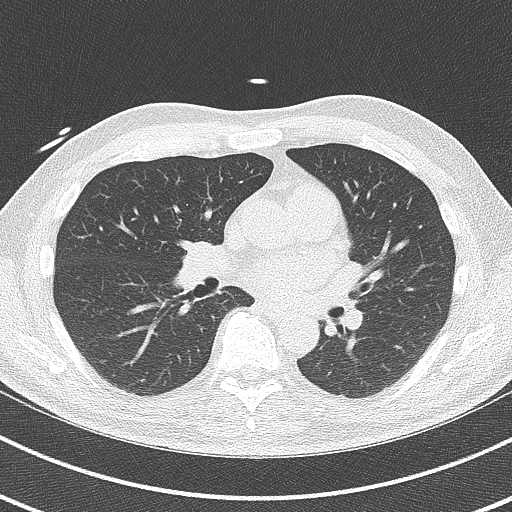

[Series 4: lung st 69 % · axial · 0.64mm/px · z∈[+1166,+1244]mm · 5 of 40 slices shown]
[im 7/40  lung]
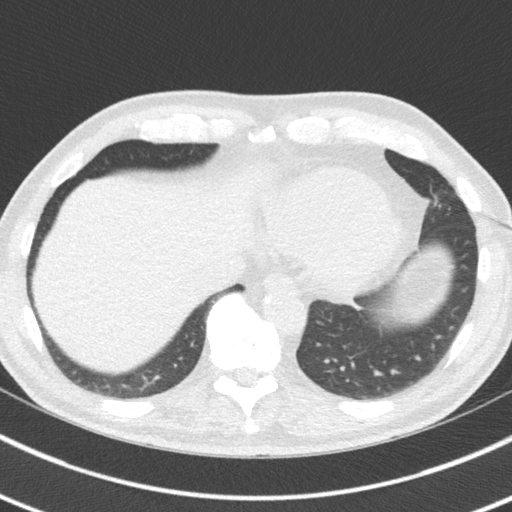
[im 14/40  lung]
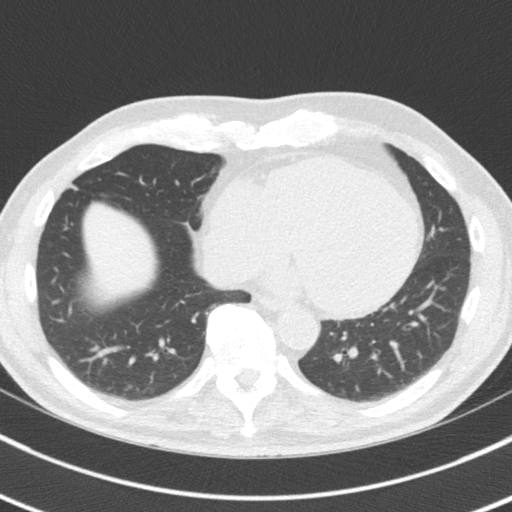
[im 20/40  lung]
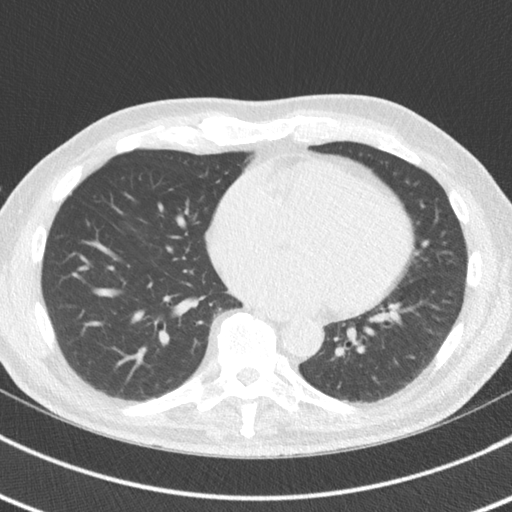
[im 27/40  lung]
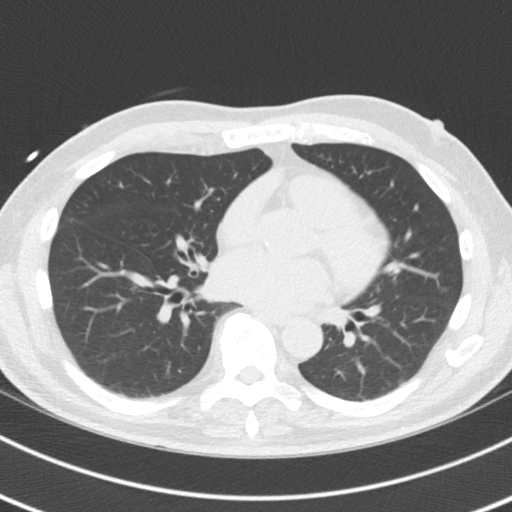
[im 33/40  lung]
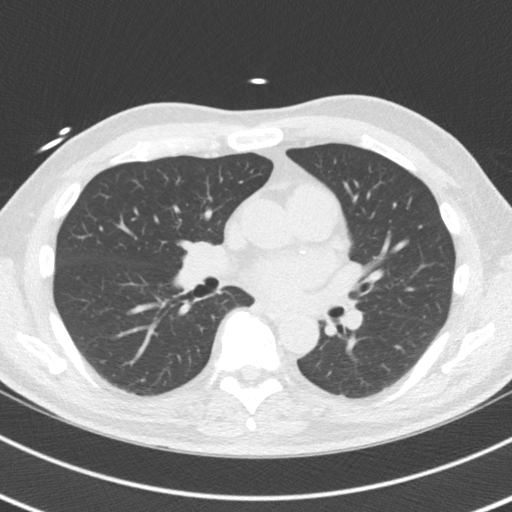

[14 of 20 positions shown; findings below may reference images not displayed]

FINDINGS: Aortic atherosclerosis. 3 mm subpleural nodule in the anterior
aspect of the left lower lobe abutting the major fissure (axial
image 17 of series 3). Within the visualized portions of the thorax
there are no other larger more suspicious appearing pulmonary
nodules or masses, there is no acute consolidative airspace disease,
no pleural effusions, no pneumothorax and no lymphadenopathy.
Visualized portions of the upper abdomen are unremarkable. There are
no aggressive appearing lytic or blastic lesions noted in the
visualized portions of the skeleton.
IMPRESSION: 1. 3 mm left lower lobe subpleural pulmonary nodule, nonspecific,
but statistically likely a benign subpleural lymph node. No
follow-up needed if patient is low-risk. Non-contrast chest CT can
be considered in 12 months if patient is high-risk. This
recommendation follows the consensus statement: Guidelines for
Management of Incidental Pulmonary Nodules Detected on CT Images:
2. Aortic Atherosclerosis (W0U5O-UKJ.J).
FINDINGS: Non-cardiac: See separate report from [REDACTED].

Ascending Aorta: Normal size

Pericardium: Normal
IMPRESSION: Coronary calcium score of 180 Agatston units. This was 44th
percentile for age and sex matched control, suggesting low to
intermediate risk for future cardiac events.

Egor Trigueros

*** End of Addendum ***
EXAM:
OVER-READ INTERPRETATION  CT CHEST

The following report is an over-read performed by radiologist Dr.
Nosa Tiger [REDACTED] on 07/15/2019. This
over-read does not include interpretation of cardiac or coronary
anatomy or pathology. The coronary calcium score interpretation by
the cardiologist is attached.
FINDINGS: Aortic atherosclerosis. 3 mm subpleural nodule in the anterior
aspect of the left lower lobe abutting the major fissure (axial
image 17 of series 3). Within the visualized portions of the thorax
there are no other larger more suspicious appearing pulmonary
nodules or masses, there is no acute consolidative airspace disease,
no pleural effusions, no pneumothorax and no lymphadenopathy.
Visualized portions of the upper abdomen are unremarkable. There are
no aggressive appearing lytic or blastic lesions noted in the
visualized portions of the skeleton.
IMPRESSION: 1. 3 mm left lower lobe subpleural pulmonary nodule, nonspecific,
but statistically likely a benign subpleural lymph node. No
follow-up needed if patient is low-risk. Non-contrast chest CT can
be considered in 12 months if patient is high-risk. This
recommendation follows the consensus statement: Guidelines for
Management of Incidental Pulmonary Nodules Detected on CT Images:
2. Aortic Atherosclerosis (W0U5O-UKJ.J).

## 2021-04-11 ENCOUNTER — Telehealth: Payer: Self-pay | Admitting: Internal Medicine

## 2021-04-11 NOTE — Telephone Encounter (Signed)
Pt states he has "early shingle symptoms" ? ?Pt states he has an "itchy patch" on his r. shoulder for several weeks   ? ?Pt states since 3-3 he has experienced sharp pain, tingling, and constant soreness on r. Shoulder ? ?Offered pt an appt, pt declined stating he will wait for doctor's advise ? ?Pt requesting a c/b w/ provider's recommendations  ?

## 2021-04-12 NOTE — Telephone Encounter (Signed)
It is very hard to make a diagnosis of shingles without a typical rash. ?It could be dry skin or something else.  Use Aquaphor lotion ?Please see me in the office if not better. ?Thanks ?

## 2021-04-13 ENCOUNTER — Other Ambulatory Visit: Payer: Self-pay

## 2021-04-13 ENCOUNTER — Ambulatory Visit (INDEPENDENT_AMBULATORY_CARE_PROVIDER_SITE_OTHER): Payer: Medicare Other | Admitting: Internal Medicine

## 2021-04-13 VITALS — BP 138/82 | HR 91 | Temp 98.0°F | Ht 68.0 in | Wt 182.0 lb

## 2021-04-13 DIAGNOSIS — M5412 Radiculopathy, cervical region: Secondary | ICD-10-CM | POA: Diagnosis not present

## 2021-04-13 DIAGNOSIS — I10 Essential (primary) hypertension: Secondary | ICD-10-CM | POA: Diagnosis not present

## 2021-04-13 DIAGNOSIS — R739 Hyperglycemia, unspecified: Secondary | ICD-10-CM | POA: Diagnosis not present

## 2021-04-13 DIAGNOSIS — M7072 Other bursitis of hip, left hip: Secondary | ICD-10-CM | POA: Diagnosis not present

## 2021-04-13 MED ORDER — TRAMADOL HCL 50 MG PO TABS: 50.0000 mg | ORAL_TABLET | Freq: Four times a day (QID) | ORAL | 0 refills | Status: DC | PRN

## 2021-04-13 MED ORDER — PREDNISONE 10 MG PO TABS: ORAL_TABLET | ORAL | 0 refills | Status: DC

## 2021-04-13 NOTE — Telephone Encounter (Signed)
Spoke with pt and was able to sched him a office visit today at 4pm with Dr. Jenny Reichmann. ?

## 2021-04-13 NOTE — Patient Instructions (Signed)
Please take all new medication as prescribed - the tramadol, and prednisone ? ?Please call in 1 week if you need further tramadol ? ?Please see Sports Medicine on the first floor for the probable left hip bursitis ? ?Please continue all other medications as before, and refills have been done if requested. ? ?Please have the pharmacy call with any other refills you may need. ? ?Please keep your appointments with your specialists as you may have planned ? ? ? ?

## 2021-04-17 DIAGNOSIS — M7072 Other bursitis of hip, left hip: Secondary | ICD-10-CM | POA: Insufficient documentation

## 2021-04-17 DIAGNOSIS — M5412 Radiculopathy, cervical region: Secondary | ICD-10-CM | POA: Insufficient documentation

## 2021-04-17 NOTE — Assessment & Plan Note (Signed)
Lab Results  ?Component Value Date  ? HGBA1C 5.9 02/19/2020  ? ?Stable, pt to continue current medical treatment  - diet ? ?

## 2021-04-17 NOTE — Assessment & Plan Note (Addendum)
Mild to mod, exam benign, no evidence for shingles, may be related to known hx of cervical spine stenosis, d/w pt - reassured, decliens further treatment such as gabapentin,  Ok for tramadol prn and prednisone asd trial, to f/u any worsening symptoms or concerns ? ?

## 2021-04-17 NOTE — Assessment & Plan Note (Signed)
Mild intermittent, exam with mild findings, d/w pt and reassured, declines other specific tx but should see sports medicine on the first floor for any worsening to consider cortisone injection, ok for tylenol prn for now ?

## 2021-04-17 NOTE — Progress Notes (Signed)
Patient ID: LENNYN BELLANCA, male   DOB: 02/04/1946, 76 y.o.   MRN: [id number] ? ? ? ?    Chief Complaint: follow up right neck and upper back pain, and left hip pain ? ?     HPI:  ESTEBAN KOBASHIGAWA is a 76 y.o. male here with 2 areas  of pain and wondering how might be related.  The first is what he thinks may be shingle as he co 1 month right post lateral neck pain burning and neuritic type to the upper back towards the shoulder blade but also to the shoulder and right up arm to just below the elbow, overall mild, intermittent and without rash, swelling, trauma, or worsening UE numbness or weakness.  Grip strength is good and constant.  Also c/o 2 wks left lateral hip pain and tender soreness mild intermittent, worse to lie on left side, better to not do this, nothing else makes better or worse.  Pt denies chest pain, increased sob or doe, wheezing, orthopnea, PND, increased LE swelling, palpitations, dizziness or syncope.   Pt denies polydipsia, polyuria, or new focal neuro s/s.    Pt denies fever, wt loss, night sweats, loss of appetite, or other constitutional symptoms  ?      ?Wt Readings from Last 3 Encounters:  ?04/13/21 182 lb (82.6 kg)  ?01/26/21 175 lb 6.4 oz (79.6 kg)  ?07/20/20 176 lb 4.8 oz (80 kg)  ? ?BP Readings from Last 3 Encounters:  ?04/13/21 138/82  ?01/26/21 120/82  ?07/20/20 128/70  ? ?      ?Past Medical History:  ?Diagnosis Date  ? Allergic rhinitis   ? Arthritis   ? BPH (benign prostatic hyperplasia)   ? Cancer [facility])   ? kidney   ? Cataract   ? bilateral   ? Cervical spinal stenosis   ? Dr Dohmier  ? Colon polyp   ? Diverticulosis of colon   ? ED (erectile dysfunction)   ? Elevated PSA   ? GERD (gastroesophageal reflux disease)   ? Hemorrhoids   ? HTN (hypertension)   ? Microcytosis   ? ?Past Surgical History:  ?Procedure Laterality Date  ? cataract surg    ? BIL  ? HEMORRHOID SURGERY    ? LAPAROSCOPIC NEPHRECTOMY Left 12/22/2019  ? Procedure: LAPAROSCOPIC RADICAL NEPHRECTOMY;  Surgeon: Raynelle Bring, MD;  Location: [facility];  Service: Urology;  Laterality: Left;  ? TOENAIL EXCISION  2009  ? ALL  ? TONSILLECTOMY    ? VASECTOMY    ? WISDOM TOOTH EXTRACTION    ? ? reports that he has never smoked. He has never used smokeless tobacco. He reports that he does not drink alcohol and does not use drugs. ?family history includes Alzheimer's disease in his brother and mother; Heart disease in his father; Hypertension in an other family member. ?Allergies  ?Allergen Reactions  ? Levofloxacin Nausea Only  ? Omeprazole   ?  Chest pain  ? Rapaflo [Silodosin]   ?  Dizzy ?  ? ?Current Outpatient Medications on File Prior to Visit  ?Medication Sig Dispense Refill  ? Bacillus Coagulans-Inulin (ALIGN PREBIOTIC-PROBIOTIC PO) Take 1 capsule by mouth at bedtime.    ? Cholecalciferol (VITAMIN D3) 50 MCG (2000 UT) capsule Take 1 capsule (2,000 Units total) by mouth daily. 100 capsule 3  ? Cyanocobalamin (B-12) 2500 MCG TABS Take 2,500 mcg by mouth daily.    ? diclofenac sodium (VOLTAREN) 1 % GEL Apply 1 application topically 4 (four) times  daily. (Patient taking differently: Apply 1 application. topically 4 (four) times daily as needed (pain).) 100 g 3  ? diphenhydrAMINE HCl (ZZZQUIL) 50 MG/30ML LIQD Take 50 mg by mouth at bedtime as needed (sleep).    ? esomeprazole (NEXIUM) 40 MG capsule TAKE ONE CAPSULE BY MOUTH EVERY MORNING BEFORE BREAKFAST 90 capsule 3  ? finasteride (PROSCAR) 5 MG tablet Take 1 tablet (5 mg total) by mouth daily. 100 tablet 3  ? fluticasone (FLONASE) 50 MCG/ACT nasal spray Place 2 sprays into both nostrils daily. 48 g 3  ? Melatonin 10 MG CAPS Take 10 mg by mouth at bedtime as needed (sleep).    ? olmesartan-hydrochlorothiazide (BENICAR HCT) 20-12.5 MG tablet Take 1 tablet by mouth daily. 90 tablet 3  ? Omega-3 Fatty Acids (FISH OIL) 1000 MG CAPS Take 2 capsules (2,000 mg total) by mouth daily. 100 capsule 3  ? sildenafil (VIAGRA) 25 MG tablet Take 25 mg by mouth daily as needed for erectile dysfunction.     ? traMADol (ULTRAM) 50 MG tablet Take 1-2 tablets (50-100 mg total) by mouth every 6 (six) hours as needed for moderate pain or severe pain. 20 tablet 0  ? zolpidem (AMBIEN) 10 MG tablet Take 1 tablet (10 mg total) by mouth at bedtime. 90 tablet 1  ? ?No current facility-administered medications on file prior to visit.  ? ?     ROS:  All others reviewed and negative. ? ?Objective  ? ?     PE:  BP 138/82   Pulse 91   Temp 98 ?F (36.7 ?C) (Oral)   Ht '5\' 8"'$  (1.727 m)   Wt 182 lb (82.6 kg)   SpO2 97%   BMI 27.67 kg/m?  ? ?              Constitutional: Pt appears in NAD ?              HENT: Head: NCAT.  ?              Right Ear: External ear normal.   ?              Left Ear: External ear normal.  ?              Eyes: . Pupils are equal, round, and reactive to light. Conjunctivae and EOM are normal ?              Nose: without d/c or deformity ?              Neck: Neck supple. Gross normal ROM ?              Cardiovascular: Normal rate and regular rhythm.   ?              Pulmonary/Chest: Effort normal and breath sounds without rales or wheezing.  ?              Abd:  Soft, NT, ND, + BS, no organomegaly ?              Neurological: Pt is alert. At baseline orientation, motor grossly intact ?              Skin: Skin is warm. No rashes, no other new lesions, LE edema - none ?              Tender over left greater trochanter ?              Psychiatric: Pt  behavior is normal without agitation  ? ?Micro: none ? ?Cardiac tracings I have personally interpreted today:  none ? ?Pertinent Radiological findings (summarize): none  ? ?Lab Results  ?Component Value Date  ? WBC 7.1 01/13/2020  ? HGB 12.1 (L) 01/13/2020  ? HCT 38.8 (L) 01/13/2020  ? PLT 196.0 01/13/2020  ? GLUCOSE 95 01/26/2021  ? CHOL 202 (H) 01/26/2021  ? TRIG 127.0 01/26/2021  ? HDL 45.10 01/26/2021  ? LDLCALC 132 (H) 01/26/2021  ? ALT 15 01/26/2021  ? AST 18 01/26/2021  ? NA 138 01/26/2021  ? K 4.8 01/26/2021  ? CL 100 01/26/2021  ? CREATININE 2.24 (H)  01/26/2021  ? BUN 28 (H) 01/26/2021  ? CO2 32 01/26/2021  ? TSH 2.02 11/13/2019  ? PSA 10.49 (H) 06/27/2017  ? HGBA1C 5.9 02/19/2020  ? ?Assessment/Plan:  ?CHRITOPHER COSTER is a 76 y.o. White or Caucasian [1] male with  has a past medical history of Allergic rhinitis, Arthritis, BPH (benign prostatic hyperplasia), Cancer ([place]), Cataract, Cervical spinal stenosis, Colon polyp, Diverticulosis of colon, ED (erectile dysfunction), Elevated PSA, GERD (gastroesophageal reflux disease), Hemorrhoids, HTN (hypertension), and Microcytosis. ? ?Radiculitis of right cervical region ?Mild to mod, exam benign, no evidence for shingles, may be related to known hx of cervical spine stenosis, d/w pt - reassured, decliens further treatment such as gabapentin,  Ok for tramadol prn and prednisone asd trial, to f/u any worsening symptoms or concerns ? ? ?Bursitis of left hip ?Mild intermittent, exam with mild findings, d/w pt and reassured, declines other specific tx but should see sports medicine on the first floor for any worsening to consider cortisone injection, ok for tylenol prn for now ? ?Hyperglycemia ?Lab Results  ?Component Value Date  ? HGBA1C 5.9 02/19/2020  ? ?Stable, pt to continue current medical treatment  - diet ? ? ?Essential hypertension ?BP Readings from Last 3 Encounters:  ?04/13/21 138/82  ?01/26/21 120/82  ?07/20/20 128/70  ? ?Stable, pt to continue medical treatment benicar hct ? ?Followup: Return if symptoms worsen or fail to improve. ? ?Cathlean Cower, MD 04/17/2021 7:22 AM ?[facility] ?[place] ?Internal Medicine ?

## 2021-04-17 NOTE — Assessment & Plan Note (Signed)
BP Readings from Last 3 Encounters:  ?04/13/21 138/82  ?01/26/21 120/82  ?07/20/20 128/70  ? ?Stable, pt to continue medical treatment benicar hct ? ?

## 2021-04-19 NOTE — Progress Notes (Signed)
? ? Benito Mccreedy D.Merril Abbe ?[facility] ?[place] ?Phone: [phone] ?  ?Assessment and Plan:   ?  ?1. Left hip pain ?2. Primary osteoarthritis of left hip ?-Chronic with exacerbation, initial sports medicine visit ?- Chronic hip and low back pain most consistent with acute flare of chronic osteoarthritis of left hip based on HPI, physical exam, x-ray ?- Patient elects for CSI intra-articular left hip.  Tolerated well per note below ?- Start Tylenol 500 to 1000 mg 2-3 times a day for day-to-day pain relief ?- Patient has history of nephrectomy, and currently only has 1 kidney.  Will not use NSAIDs ?- Start HEP for general hip strengthening ?- X-ray obtained in clinic.  My interpretation: No acute fracture or dislocation.  Cortical changes most significant in bilateral acetabulum consistent with femoral acetabular osteoarthritis.  Cortical changes along pubic ramus ?  ?Procedure: Ultrasound Guided Hip Acetabulofemoral Joint Injection ?Side: Left ?Diagnosis: Acute flare of osteoarthritis ?[place] Indication:  ?- accuracy is paramount for diagnosis ?- to ensure therapeutic efficacy or procedural success ?- to reduce procedural risk ? ?After explaining the procedure, viable alternatives, risks, and answering any questions, consent was given verbally. The site was cleaned with betadyne prep. An ultrasound transducer was placed on the anterior thigh/hip.   The acetabular joint, labrum, and femoral shaft were identified.  The neurovascular structures were identified and an approach was found specifically avoiding these structures.  A steroid injection was performed under ultrasound guidance with sterile technique using 108m of 1% lidocaine without epinephrine and 40 mg of triamcinolone (KENALOG) '40mg'$ /ml. This was well tolerated and resulted in relief.  Needle was removed and dressing placed and post injection instructions were given including  a discussion of likely return  of pain today after the anesthetic wears off (with the possibility of worsened pain) until the steroid starts to work in 1-3 days.   Pt was advised to call or return to clinic if these symptoms worsen or fail to improve as anticipated. ? ? ?Pertinent previous records reviewed include none ?  ?Follow Up: 3 weeks for reevaluation.  Could consider alternative CSI if pain has changed location or additional imaging of low back ?  ?Subjective:   ?I, MPincus Badder am serving as a sEducation administratorfor Doctor [employer]? ?Chief Complaint: left hip pain  ? ?HPI:  ?04/20/2021 ?Patient is a 76year old male complaining of left hip pain. Patient states the pain hurts the worst when he is laying down has a hard time getting sleep and getting out of bed he hasnt been ale to sleep through the night due to the pain, been going on for about 2 weeks, no MOI, does get some numbness tingling that radiates down the IT band , and up to the low back , no clicking or popping in the hip , does note that he had his kidney removed 2 years in November , has been taking tramadol was taking one a day before bed but hasn't had any in about 2-3 days, sleeping on his stomach gives him some relief , does note he has spinal stenosis  ? ?Relevant Historical Information: History of nephrectomy, hypertension ? ?Additional pertinent review of systems negative. ? ? ?Current Outpatient Medications:  ?  Bacillus Coagulans-Inulin (ALIGN PREBIOTIC-PROBIOTIC PO), Take 1 capsule by mouth at bedtime., Disp: , Rfl:  ?  Cholecalciferol (VITAMIN D3) 50 MCG (2000 UT) capsule, Take 1 capsule (2,000 Units total) by mouth daily., Disp: 100  capsule, Rfl: 3 ?  Cyanocobalamin (B-12) 2500 MCG TABS, Take 2,500 mcg by mouth daily., Disp: , Rfl:  ?  diclofenac sodium (VOLTAREN) 1 % GEL, Apply 1 application topically 4 (four) times daily. (Patient taking differently: Apply 1 application. topically 4 (four) times daily as needed (pain).), Disp: 100 g, Rfl: 3 ?  diphenhydrAMINE  HCl (ZZZQUIL) 50 MG/30ML LIQD, Take 50 mg by mouth at bedtime as needed (sleep)., Disp: , Rfl:  ?  esomeprazole (NEXIUM) 40 MG capsule, TAKE ONE CAPSULE BY MOUTH EVERY MORNING BEFORE BREAKFAST, Disp: 90 capsule, Rfl: 3 ?  finasteride (PROSCAR) 5 MG tablet, Take 1 tablet (5 mg total) by mouth daily., Disp: 100 tablet, Rfl: 3 ?  fluticasone (FLONASE) 50 MCG/ACT nasal spray, Place 2 sprays into both nostrils daily., Disp: 48 g, Rfl: 3 ?  Melatonin 10 MG CAPS, Take 10 mg by mouth at bedtime as needed (sleep)., Disp: , Rfl:  ?  olmesartan-hydrochlorothiazide (BENICAR HCT) 20-12.5 MG tablet, Take 1 tablet by mouth daily., Disp: 90 tablet, Rfl: 3 ?  Omega-3 Fatty Acids (FISH OIL) 1000 MG CAPS, Take 2 capsules (2,000 mg total) by mouth daily., Disp: 100 capsule, Rfl: 3 ?  predniSONE (DELTASONE) 10 MG tablet, 3 tabs by mouth per day for 3 days,2tabs per day for 3 days,1tab per day for 3 days, Disp: 18 tablet, Rfl: 0 ?  sildenafil (VIAGRA) 25 MG tablet, Take 25 mg by mouth daily as needed for erectile dysfunction., Disp: , Rfl:  ?  traMADol (ULTRAM) 50 MG tablet, Take 1-2 tablets (50-100 mg total) by mouth every 6 (six) hours as needed for moderate pain or severe pain., Disp: 20 tablet, Rfl: 0 ?  traMADol (ULTRAM) 50 MG tablet, Take 1 tablet (50 mg total) by mouth every 6 (six) hours as needed., Disp: 30 tablet, Rfl: 0 ?  zolpidem (AMBIEN) 10 MG tablet, Take 1 tablet (10 mg total) by mouth at bedtime., Disp: 90 tablet, Rfl: 1  ? ?Objective:   ?  ?Vitals:  ? 04/20/21 1312  ?BP: 122/76  ?Pulse: 85  ?SpO2: 98%  ?Weight: 182 lb (82.6 kg)  ?Height: '5\' 8"'$  (1.727 m)  ?  ?  ?Body mass index is 27.67 kg/m?.  ?  ?Physical Exam:   ? ?General: awake, alert, and oriented no acute distress, nontoxic ?Skin: no suspicious lesions or rashes ?Neuro:sensation intact distally with no dificits, normal muscle tone, no atrophy, strength 5/5 in all tested lower ext groups ?Psych: normal mood and affect, speech clear ? ?Left hip: ?No deformity,  swelling or wasting ?ROM Flexion 90, ext 20, IR 30, ER 35 ?TTP greater trochanter, gluteal musculature ?NTTP over the hip flexors,  si joint, lumbar spine ?Negative log roll with FROM ?Positive FABER ?Positive FADIR ?Positive piriformis test ?  ?Gait normal  ? ? ?Electronically signed by:  ?Benito Mccreedy D.Merril Abbe ?[facility] ?1:54 PM 04/20/21 ?

## 2021-04-20 ENCOUNTER — Other Ambulatory Visit: Payer: Self-pay

## 2021-04-20 ENCOUNTER — Ambulatory Visit (INDEPENDENT_AMBULATORY_CARE_PROVIDER_SITE_OTHER): Payer: Medicare Other

## 2021-04-20 ENCOUNTER — Ambulatory Visit (INDEPENDENT_AMBULATORY_CARE_PROVIDER_SITE_OTHER): Payer: Medicare Other | Admitting: Sports Medicine

## 2021-04-20 ENCOUNTER — Ambulatory Visit: Payer: Self-pay

## 2021-04-20 VITALS — BP 122/76 | HR 85 | Ht 68.0 in | Wt 182.0 lb

## 2021-04-20 DIAGNOSIS — M1612 Unilateral primary osteoarthritis, left hip: Secondary | ICD-10-CM

## 2021-04-20 DIAGNOSIS — M25552 Pain in left hip: Secondary | ICD-10-CM | POA: Diagnosis not present

## 2021-04-20 NOTE — Patient Instructions (Addendum)
Good to see you  ?Start Tylenol [phone] mg 2-3 times a day for pain relief  ?Hip HEP  ?3 week follow up  ? ?

## 2021-04-26 ENCOUNTER — Other Ambulatory Visit: Payer: Self-pay | Admitting: Internal Medicine

## 2021-04-26 DIAGNOSIS — N1832 Chronic kidney disease, stage 3b: Secondary | ICD-10-CM | POA: Diagnosis not present

## 2021-04-26 DIAGNOSIS — I129 Hypertensive chronic kidney disease with stage 1 through stage 4 chronic kidney disease, or unspecified chronic kidney disease: Secondary | ICD-10-CM | POA: Diagnosis not present

## 2021-04-26 DIAGNOSIS — M1612 Unilateral primary osteoarthritis, left hip: Secondary | ICD-10-CM | POA: Diagnosis not present

## 2021-04-26 DIAGNOSIS — C642 Malignant neoplasm of left kidney, except renal pelvis: Secondary | ICD-10-CM | POA: Diagnosis not present

## 2021-05-11 NOTE — Progress Notes (Signed)
? ? Chad Palmer D.Merril Abbe ?[facility] ?[place] ?Phone: [phone] ?  ?Assessment and Plan:   ?  ?1. Left hip pain ?2. Primary osteoarthritis of left hip ?3. Greater trochanteric bursitis of left hip ?-Chronic with exacerbation, subsequent visit ?- Chronic hip pain and low back pain that had moderate improvement after intra-articular left hip injection at office visit on 04/20/2021.  Patient's remaining pain is most consistent with greater trochanteric bursitis based on HPI and physical exam ?- Patient elected for greater trochanteric CSI.  Tolerated well per note below ? ?Procedure: Greater trochanteric bursal injection ?Side: Left ? ?Risks explained and consent was given verbally. The site was cleaned with alcohol prep. A steroid injection was performed with patient in the lateral side-lying position at area of maximum tenderness over greater trochanter using 20m of 1% lidocaine without epinephrine and 160mof kenalog '40mg'$ /ml. This was well tolerated and resulted in symptomatic relief.  Needle was removed, hemostasis achieved, and post injection instructions were explained.  Pt was advised to call or return to clinic if these symptoms worsen or fail to improve as anticipated.  ? ?  ?Pertinent previous records reviewed include none ?  ?Follow Up: 2 to 3 weeks for reevaluation.  Could consider PT versus advanced imaging if no improvement or worsening of symptoms ?  ?Subjective:   ?I, MoPincus Badderam serving as a scEducation administratoror [employer] ?Chief Complaint: left hip pain  ? ?HPI:  ?04/20/2021 ?Patient is a [age]ear old male complaining of left hip pain. Patient states the pain hurts the worst when he is laying down has a hard time getting sleep and getting out of bed he hasnt been ale to sleep through the night due to the pain, been going on for about 2 weeks, no MOI, does get some numbness tingling that radiates down the IT band , and up to the low  back , no clicking or popping in the hip , does note that he had his kidney removed 2 years in November , has been taking tramadol was taking one a day before bed but hasn't had any in about 2-3 days, sleeping on his stomach gives him some relief , does note he has spinal stenosis  ?  ?05/12/2021 ?Patient states that hes pretty good, no more sharpe pain , a low grade tender pain if he moves a certain way bending over is a problem but not what it was like when he first came in , shot worked for a day and a half then it hurt like hell then started going away late last week he was feeling the best , was progressively getting better now he just has a low grade pain that he is getting used to  ? ? ?Relevant Historical Information: History of nephrectomy, hypertension ? ?Additional pertinent review of systems negative. ? ? ?Current Outpatient Medications:  ?  Bacillus Coagulans-Inulin (ALIGN PREBIOTIC-PROBIOTIC PO), Take 1 capsule by mouth at bedtime., Disp: , Rfl:  ?  Cholecalciferol (VITAMIN D3) 50 MCG (2000 UT) capsule, Take 1 capsule (2,000 Units total) by mouth daily., Disp: 100 capsule, Rfl: 3 ?  Cyanocobalamin (B-12) 2500 MCG TABS, Take 2,500 mcg by mouth daily., Disp: , Rfl:  ?  diclofenac sodium (VOLTAREN) 1 % GEL, Apply 1 application topically 4 (four) times daily. (Patient taking differently: Apply 1 application. topically 4 (four) times daily as needed (pain).), Disp: 100 g, Rfl: 3 ?  diphenhydrAMINE HCl (ZZZQUIL)  50 MG/30ML LIQD, Take 50 mg by mouth at bedtime as needed (sleep)., Disp: , Rfl:  ?  esomeprazole (NEXIUM) 40 MG capsule, TAKE ONE CAPSULE BY MOUTH EVERY MORNING BEFORE BREAKFAST, Disp: 90 capsule, Rfl: 3 ?  finasteride (PROSCAR) 5 MG tablet, Take 1 tablet (5 mg total) by mouth daily., Disp: 100 tablet, Rfl: 3 ?  fluticasone (FLONASE) 50 MCG/ACT nasal spray, INSTILL 2 SPRAYS IN EACH NOSTRIL ONCE DAILY, Disp: 48 mL, Rfl: 1 ?  Melatonin 10 MG CAPS, Take 10 mg by mouth at bedtime as needed (sleep)., Disp:  , Rfl:  ?  olmesartan-hydrochlorothiazide (BENICAR HCT) 20-12.5 MG tablet, Take 1 tablet by mouth daily., Disp: 90 tablet, Rfl: 3 ?  Omega-3 Fatty Acids (FISH OIL) 1000 MG CAPS, Take 2 capsules (2,000 mg total) by mouth daily., Disp: 100 capsule, Rfl: 3 ?  predniSONE (DELTASONE) 10 MG tablet, 3 tabs by mouth per day for 3 days,2tabs per day for 3 days,1tab per day for 3 days, Disp: 18 tablet, Rfl: 0 ?  sildenafil (VIAGRA) 25 MG tablet, Take 25 mg by mouth daily as needed for erectile dysfunction., Disp: , Rfl:  ?  traMADol (ULTRAM) 50 MG tablet, Take 1-2 tablets (50-100 mg total) by mouth every 6 (six) hours as needed for moderate pain or severe pain., Disp: 20 tablet, Rfl: 0 ?  traMADol (ULTRAM) 50 MG tablet, Take 1 tablet (50 mg total) by mouth every 6 (six) hours as needed., Disp: 30 tablet, Rfl: 0 ?  zolpidem (AMBIEN) 10 MG tablet, Take 1 tablet (10 mg total) by mouth at bedtime., Disp: 90 tablet, Rfl: 1  ? ?Objective:   ?  ?Vitals:  ? 05/12/21 1321  ?BP: 130/82  ?Pulse: 84  ?SpO2: 98%  ?Weight: 177 lb (80.3 kg)  ?Height: '5\' 8"'$  (1.727 m)  ?  ?  ?Body mass index is 26.91 kg/m?.  ?  ?Physical Exam:   ? ?General: awake, alert, and oriented no acute distress, nontoxic ?Skin: no suspicious lesions or rashes ?Neuro:sensation intact distally with no dificits, normal muscle tone, no atrophy, strength 5/5 in all tested lower ext groups ?Psych: normal mood and affect, speech clear ? ?Left hip: ?No deformity, swelling or wasting ?ROM Flexion 90, ext 30, IR 45, ER 45 ?TTP greater trochanter ?NTTP over the hip flexors,   glute musculature, si joint, lumbar spine ?Negative log roll with FROM ?Negative FABER ?Positive FADIR ?Negative Piriformis test ?Negative trendelenberg ?Gait normal  ? ? ?Electronically signed by:  ?Chad Palmer D.Merril Abbe ?[facility] ?1:47 PM 05/12/21 ?

## 2021-05-12 ENCOUNTER — Ambulatory Visit (INDEPENDENT_AMBULATORY_CARE_PROVIDER_SITE_OTHER): Payer: Medicare Other | Admitting: Sports Medicine

## 2021-05-12 VITALS — BP 130/82 | HR 84 | Ht 68.0 in | Wt 177.0 lb

## 2021-05-12 DIAGNOSIS — M25552 Pain in left hip: Secondary | ICD-10-CM | POA: Diagnosis not present

## 2021-05-12 DIAGNOSIS — M1612 Unilateral primary osteoarthritis, left hip: Secondary | ICD-10-CM

## 2021-05-12 DIAGNOSIS — M7062 Trochanteric bursitis, left hip: Secondary | ICD-10-CM

## 2021-05-12 NOTE — Patient Instructions (Addendum)
Good to see you  ?Continue HEP  ?2-3 week follow up  ? ? ?

## 2021-06-10 NOTE — Progress Notes (Signed)
? ? Chad Palmer D.Merril Abbe ?[facility] ?[place] ?Phone: [phone] ?  ?Assessment and Plan:   ?  ?1. Left hip pain ?2. Primary osteoarthritis of left hip ?3. Greater trochanteric bursitis of left hip ?-Chronic with exacerbation, subsequent visit ?- Overall significant improvement in left hip pain after intra-articular hip CSI on 04/20/2021 and subsequent greater trochanteric injection on 05/12/2021 ?- Patient is able to perform all ADLs and IADLs without limitation, and only has a minor ache at this time ?- Continue Tylenol for day-to-day pain relief ?  ?Pertinent previous records reviewed include none ?  ?Follow Up: As needed if no improvement or worsening of symptoms.  Could consider repeating CSI as previous injections were beneficial ?  ?Subjective:   ?I, Chad Palmer, am serving as a Education administrator for [employer] ?  ?Chief Complaint: left hip pain  ?  ?HPI:  ?04/20/2021 ?Patient is a 76 year old male complaining of left hip pain. Patient states the pain hurts the worst when he is laying down has a hard time getting sleep and getting out of bed he hasnt been ale to sleep through the night due to the pain, been going on for about 2 weeks, no MOI, does get some numbness tingling that radiates down the IT band , and up to the low back , no clicking or popping in the hip , does note that he had his kidney removed 2 years in November , has been taking tramadol was taking one a day before bed but hasn't had any in about 2-3 days, sleeping on his stomach gives him some relief , does note he has spinal stenosis  ?  ?05/12/2021 ?Patient states that hes pretty good, no more sharpe pain , a low grade tender pain if he moves a certain way bending over is a problem but not what it was like when he first came in , shot worked for a day and a half then it hurt like hell then started going away late last week he was feeling the best , was progressively getting better  now he just has a low grade pain that he is getting used to  ?  ?06/13/2021 ?Patient states that he is not bad , no severe pain has some soreness every once and a while, feels like its more muscle than anything else  ? ?  ?Relevant Historical Information: History of nephrectomy, hypertension ? ?Additional pertinent review of systems negative. ? ? ?Current Outpatient Medications:  ?  Bacillus Coagulans-Inulin (ALIGN PREBIOTIC-PROBIOTIC PO), Take 1 capsule by mouth at bedtime., Disp: , Rfl:  ?  Cholecalciferol (VITAMIN D3) 50 MCG (2000 UT) capsule, Take 1 capsule (2,000 Units total) by mouth daily., Disp: 100 capsule, Rfl: 3 ?  Cyanocobalamin (B-12) 2500 MCG TABS, Take 2,500 mcg by mouth daily., Disp: , Rfl:  ?  diclofenac sodium (VOLTAREN) 1 % GEL, Apply 1 application topically 4 (four) times daily. (Patient taking differently: Apply 1 application. topically 4 (four) times daily as needed (pain).), Disp: 100 g, Rfl: 3 ?  diphenhydrAMINE HCl (ZZZQUIL) 50 MG/30ML LIQD, Take 50 mg by mouth at bedtime as needed (sleep)., Disp: , Rfl:  ?  esomeprazole (NEXIUM) 40 MG capsule, TAKE ONE CAPSULE BY MOUTH EVERY MORNING BEFORE BREAKFAST, Disp: 90 capsule, Rfl: 3 ?  finasteride (PROSCAR) 5 MG tablet, Take 1 tablet (5 mg total) by mouth daily., Disp: 100 tablet, Rfl: 3 ?  fluticasone (FLONASE) 50 MCG/ACT nasal spray, INSTILL  2 SPRAYS IN EACH NOSTRIL ONCE DAILY, Disp: 48 mL, Rfl: 1 ?  Melatonin 10 MG CAPS, Take 10 mg by mouth at bedtime as needed (sleep)., Disp: , Rfl:  ?  olmesartan-hydrochlorothiazide (BENICAR HCT) 20-12.5 MG tablet, Take 1 tablet by mouth daily., Disp: 90 tablet, Rfl: 3 ?  Omega-3 Fatty Acids (FISH OIL) 1000 MG CAPS, Take 2 capsules (2,000 mg total) by mouth daily., Disp: 100 capsule, Rfl: 3 ?  predniSONE (DELTASONE) 10 MG tablet, 3 tabs by mouth per day for 3 days,2tabs per day for 3 days,1tab per day for 3 days, Disp: 18 tablet, Rfl: 0 ?  sildenafil (VIAGRA) 25 MG tablet, Take 25 mg by mouth daily as needed for  erectile dysfunction., Disp: , Rfl:  ?  traMADol (ULTRAM) 50 MG tablet, Take 1-2 tablets (50-100 mg total) by mouth every 6 (six) hours as needed for moderate pain or severe pain., Disp: 20 tablet, Rfl: 0 ?  traMADol (ULTRAM) 50 MG tablet, Take 1 tablet (50 mg total) by mouth every 6 (six) hours as needed., Disp: 30 tablet, Rfl: 0 ?  zolpidem (AMBIEN) 10 MG tablet, Take 1 tablet (10 mg total) by mouth at bedtime., Disp: 90 tablet, Rfl: 1  ? ?Objective:   ?  ?Vitals:  ? 06/13/21 1302  ?BP: 132/82  ?Pulse: 93  ?SpO2: 95%  ?Weight: 176 lb (79.8 kg)  ?Height: '5\' 8"'$  (1.727 m)  ?  ?  ?Body mass index is 26.76 kg/m?.  ?  ?Physical Exam:   ? ?General: awake, alert, and oriented no acute distress, nontoxic ?Skin: no suspicious lesions or rashes ?Neuro:sensation intact distally with no dificits, normal muscle tone, no atrophy, strength 5/5 in all tested lower ext groups ?Psych: normal mood and affect, speech clear ? ?Left hip: ?No deformity, swelling or wasting ?ROM Flexion 90, ext 30, IR 40, ER 40 ?NTTP over the hip flexors, greater troch, glute musculature, si joint, lumbar spine ?Negative log roll with FROM ?Positive FABER ?Positive FADIR ?Negative Piriformis test ?Negative trendelenberg ?Gait normal  ? ? ?Electronically signed by:  ?Chad Palmer D.Merril Abbe ?[facility] ?1:21 PM 06/13/21 ?

## 2021-06-13 ENCOUNTER — Ambulatory Visit (INDEPENDENT_AMBULATORY_CARE_PROVIDER_SITE_OTHER): Payer: Medicare Other | Admitting: Sports Medicine

## 2021-06-13 VITALS — BP 132/82 | HR 93 | Ht 68.0 in | Wt 176.0 lb

## 2021-06-13 DIAGNOSIS — M7062 Trochanteric bursitis, left hip: Secondary | ICD-10-CM | POA: Diagnosis not present

## 2021-06-13 DIAGNOSIS — M25552 Pain in left hip: Secondary | ICD-10-CM | POA: Diagnosis not present

## 2021-06-13 DIAGNOSIS — M1612 Unilateral primary osteoarthritis, left hip: Secondary | ICD-10-CM

## 2021-06-13 DIAGNOSIS — R972 Elevated prostate specific antigen [PSA]: Secondary | ICD-10-CM | POA: Diagnosis not present

## 2021-06-13 NOTE — Patient Instructions (Signed)
Good to see you   

## 2021-06-14 DIAGNOSIS — C649 Malignant neoplasm of unspecified kidney, except renal pelvis: Secondary | ICD-10-CM | POA: Diagnosis not present

## 2021-06-14 DIAGNOSIS — N400 Enlarged prostate without lower urinary tract symptoms: Secondary | ICD-10-CM | POA: Diagnosis not present

## 2021-06-14 DIAGNOSIS — K5730 Diverticulosis of large intestine without perforation or abscess without bleeding: Secondary | ICD-10-CM | POA: Diagnosis not present

## 2021-06-14 DIAGNOSIS — Z85528 Personal history of other malignant neoplasm of kidney: Secondary | ICD-10-CM | POA: Diagnosis not present

## 2021-07-20 ENCOUNTER — Other Ambulatory Visit: Payer: Self-pay | Admitting: Internal Medicine

## 2021-07-20 NOTE — Telephone Encounter (Signed)
Check [place] registry last filled 04/25/2021.Marland KitchenJohny Palmer

## 2021-07-22 ENCOUNTER — Ambulatory Visit: Payer: Medicare Other

## 2021-08-01 ENCOUNTER — Ambulatory Visit: Payer: Medicare Other

## 2021-08-01 ENCOUNTER — Telehealth: Payer: Self-pay

## 2021-08-01 NOTE — Telephone Encounter (Signed)
Called patient x 3 on all listed numbers , no answer, automatic vm , unable to leave a message.  Patient may reschedule for the next available appointment.  L.Jamesyn [place]field,LPN

## 2021-10-06 DIAGNOSIS — L720 Epidermal cyst: Secondary | ICD-10-CM | POA: Diagnosis not present

## 2021-10-11 ENCOUNTER — Other Ambulatory Visit: Payer: Self-pay | Admitting: Internal Medicine

## 2021-10-17 ENCOUNTER — Telehealth: Payer: Self-pay | Admitting: Internal Medicine

## 2021-10-17 NOTE — Telephone Encounter (Signed)
LVM for pt to rtn my call to schedule AWV with [facility], call back # [phone]

## 2021-10-18 ENCOUNTER — Telehealth: Payer: Self-pay | Admitting: Internal Medicine

## 2021-10-18 NOTE — Telephone Encounter (Signed)
LVM for pt to rtn my call to schedule AWV with [facility] call back # [phone] 

## 2021-10-28 ENCOUNTER — Telehealth: Payer: Self-pay

## 2021-10-28 ENCOUNTER — Ambulatory Visit: Payer: Medicare Other

## 2021-10-28 NOTE — Telephone Encounter (Signed)
Called x4 left voice mail with no return call , Patient may reschedule for the next available appointment   L.Wilson,LPN

## 2021-11-01 DIAGNOSIS — N1832 Chronic kidney disease, stage 3b: Secondary | ICD-10-CM | POA: Diagnosis not present

## 2021-11-01 NOTE — Telephone Encounter (Signed)
Appt rescheduled

## 2021-11-09 ENCOUNTER — Ambulatory Visit (INDEPENDENT_AMBULATORY_CARE_PROVIDER_SITE_OTHER): Payer: Medicare Other

## 2021-11-09 VITALS — BP 118/70 | HR 80 | Temp 97.7°F | Resp 16 | Ht 68.0 in | Wt 180.6 lb

## 2021-11-09 DIAGNOSIS — Z0000 Encounter for general adult medical examination without abnormal findings: Secondary | ICD-10-CM

## 2021-11-09 DIAGNOSIS — M79603 Pain in arm, unspecified: Secondary | ICD-10-CM | POA: Diagnosis not present

## 2021-11-09 DIAGNOSIS — I129 Hypertensive chronic kidney disease with stage 1 through stage 4 chronic kidney disease, or unspecified chronic kidney disease: Secondary | ICD-10-CM | POA: Diagnosis not present

## 2021-11-09 DIAGNOSIS — C642 Malignant neoplasm of left kidney, except renal pelvis: Secondary | ICD-10-CM | POA: Diagnosis not present

## 2021-11-09 DIAGNOSIS — N1832 Chronic kidney disease, stage 3b: Secondary | ICD-10-CM | POA: Diagnosis not present

## 2021-11-09 NOTE — Patient Instructions (Addendum)
Mr. Chad Palmer , Thank you for taking time to come for your Medicare Wellness Visit. I appreciate your ongoing commitment to your health goals. Please review the following plan we discussed and let me know if I can assist you in the future.   These are the goals we discussed:  Goals      My goal is to stay alive and healthy.        This is a list of the screening recommended for you and due dates:  Health Maintenance  Topic Date Due   Zoster (Shingles) Vaccine (1 of 2) Never done   COVID-19 Vaccine (3 - Moderna risk series) 05/01/2019   Flu Shot  09/06/2021   Colon Cancer Screening  02/19/2024   Tetanus Vaccine  11/26/2030   Pneumonia Vaccine  Completed   Hepatitis C Screening: USPSTF Recommendation to screen - Ages 18-79 yo.  Completed   HPV Vaccine  Aged Out    Advanced directives: Yes  Conditions/risks identified: Yes  Next appointment: Follow up in one year for your annual wellness visit.   Preventive Care 60 Years and Older, Male  Preventive care refers to lifestyle choices and visits with your health care provider that can promote health and wellness. What does preventive care include? A yearly physical exam. This is also called an annual well check. Dental exams once or twice a year. Routine eye exams. Ask your health care provider how often you should have your eyes checked. Personal lifestyle choices, including: Daily care of your teeth and gums. Regular physical activity. Eating a healthy diet. Avoiding tobacco and drug use. Limiting alcohol use. Practicing safe sex. Taking low doses of aspirin every day. Taking vitamin and mineral supplements as recommended by your health care provider. What happens during an annual well check? The services and screenings done by your health care provider during your annual well check will depend on your age, overall health, lifestyle risk factors, and family history of disease. Counseling  Your health care provider may ask you  questions about your: Alcohol use. Tobacco use. Drug use. Emotional well-being. Home and relationship well-being. Sexual activity. Eating habits. History of falls. Memory and ability to understand (cognition). Work and work Statistician. Screening  You may have the following tests or measurements: Height, weight, and BMI. Blood pressure. Lipid and cholesterol levels. These may be checked every 5 years, or more frequently if you are over 55 years old. Skin check. Lung cancer screening. You may have this screening every year starting at age 14 if you have a 30-pack-year history of smoking and currently smoke or have quit within the past 15 years. Fecal occult blood test (FOBT) of the stool. You may have this test every year starting at age 48. Flexible sigmoidoscopy or colonoscopy. You may have a sigmoidoscopy every 5 years or a colonoscopy every 10 years starting at age 69. Prostate cancer screening. Recommendations will vary depending on your family history and other risks. Hepatitis C blood test. Hepatitis B blood test. Sexually transmitted disease (STD) testing. Diabetes screening. This is done by checking your blood sugar (glucose) after you have not eaten for a while (fasting). You may have this done every 1-3 years. Abdominal aortic aneurysm (AAA) screening. You may need this if you are a current or former smoker. Osteoporosis. You may be screened starting at age 42 if you are at high risk. Talk with your health care provider about your test results, treatment options, and if necessary, the need for more tests. Vaccines  Your health care provider may recommend certain vaccines, such as: Influenza vaccine. This is recommended every year. Tetanus, diphtheria, and acellular pertussis (Tdap, Td) vaccine. You may need a Td booster every 10 years. Zoster vaccine. You may need this after age 33. Pneumococcal 13-valent conjugate (PCV13) vaccine. One dose is recommended after age  70. Pneumococcal polysaccharide (PPSV23) vaccine. One dose is recommended after age 43. Talk to your health care provider about which screenings and vaccines you need and how often you need them. This information is not intended to replace advice given to you by your health care provider. Make sure you discuss any questions you have with your health care provider. Document Released: 02/19/2015 Document Revised: 10/13/2015 Document Reviewed: 11/24/2014 Elsevier Interactive Patient Education  2017 [employer] Prevention in the Home Falls can cause injuries. They can happen to people of all ages. There are many things you can do to make your home safe and to help prevent falls. What can I do on the outside of my home? Regularly fix the edges of walkways and driveways and fix any cracks. Remove anything that might make you trip as you walk through a door, such as a raised step or threshold. Trim any bushes or trees on the path to your home. Use bright outdoor lighting. Clear any walking paths of anything that might make someone trip, such as rocks or tools. Regularly check to see if handrails are loose or broken. Make sure that both sides of any steps have handrails. Any raised decks and porches should have guardrails on the edges. Have any leaves, snow, or ice cleared regularly. Use sand or salt on walking paths during winter. Clean up any spills in your garage right away. This includes oil or grease spills. What can I do in the bathroom? Use night lights. Install grab bars by the toilet and in the tub and shower. Do not use towel bars as grab bars. Use non-skid mats or decals in the tub or shower. If you need to sit down in the shower, use a plastic, non-slip stool. Keep the floor dry. Clean up any water that spills on the floor as soon as it happens. Remove soap buildup in the tub or shower regularly. Attach bath mats securely with double-sided non-slip rug tape. Do not have throw  rugs and other things on the floor that can make you trip. What can I do in the bedroom? Use night lights. Make sure that you have a light by your bed that is easy to reach. Do not use any sheets or blankets that are too big for your bed. They should not hang down onto the floor. Have a firm chair that has side arms. You can use this for support while you get dressed. Do not have throw rugs and other things on the floor that can make you trip. What can I do in the kitchen? Clean up any spills right away. Avoid walking on wet floors. Keep items that you use a lot in easy-to-reach places. If you need to reach something above you, use a strong step stool that has a grab bar. Keep electrical cords out of the way. Do not use floor polish or wax that makes floors slippery. If you must use wax, use non-skid floor wax. Do not have throw rugs and other things on the floor that can make you trip. What can I do with my stairs? Do not leave any items on the stairs. Make sure that there are  handrails on both sides of the stairs and use them. Fix handrails that are broken or loose. Make sure that handrails are as long as the stairways. Check any carpeting to make sure that it is firmly attached to the stairs. Fix any carpet that is loose or worn. Avoid having throw rugs at the top or bottom of the stairs. If you do have throw rugs, attach them to the floor with carpet tape. Make sure that you have a light switch at the top of the stairs and the bottom of the stairs. If you do not have them, ask someone to add them for you. What else can I do to help prevent falls? Wear shoes that: Do not have high heels. Have rubber bottoms. Are comfortable and fit you well. Are closed at the toe. Do not wear sandals. If you use a stepladder: Make sure that it is fully opened. Do not climb a closed stepladder. Make sure that both sides of the stepladder are locked into place. Ask someone to hold it for you, if  possible. Clearly mark and make sure that you can see: Any grab bars or handrails. First and last steps. Where the edge of each step is. Use tools that help you move around (mobility aids) if they are needed. These include: Canes. Walkers. Scooters. Crutches. Turn on the lights when you go into a dark area. Replace any light bulbs as soon as they burn out. Set up your furniture so you have a clear path. Avoid moving your furniture around. If any of your floors are uneven, fix them. If there are any pets around you, be aware of where they are. Review your medicines with your doctor. Some medicines can make you feel dizzy. This can increase your chance of falling. Ask your doctor what other things that you can do to help prevent falls. This information is not intended to replace advice given to you by your health care provider. Make sure you discuss any questions you have with your health care provider. Document Released: 11/19/2008 Document Revised: 07/01/2015 Document Reviewed: 02/27/2014 Elsevier Interactive Patient Education  2017 [employer].

## 2021-11-09 NOTE — Progress Notes (Signed)
Subjective:   Chad Palmer is a 76 y.o. male who presents for Medicare Annual/Subsequent preventive examination.  Review of Systems     Cardiac Risk Factors include: advanced age (>31mn, >>24women);hypertension;family history of premature cardiovascular disease;male gender     Objective:    Today's Vitals   11/09/21 1520  BP: 118/70  Pulse: 80  Resp: 16  Temp: 97.7 F (36.5 C)  SpO2: 98%  Weight: 180 lb 9.6 oz (81.9 kg)  Height: '5\' 8"'$  (1.727 m)  PainSc: 0-No pain   Body mass index is 27.46 kg/m.     11/09/2021    3:40 PM 07/20/2020   12:36 PM 12/22/2019    1:11 PM 12/15/2019    1:48 PM 07/04/2019    9:07 AM 07/02/2018    8:20 AM 06/22/2017    8:35 AM  Advanced Directives  Does Patient Have a Medical Advance Directive? Yes Yes Yes  Yes Yes Yes  Type of AParamedicof AShumwayLiving will Living will;Healthcare Power of ABellefonteLiving will HSt. LucasLiving will HConyersLiving will HOrchardLiving will HCypress QuartersLiving will  Does patient want to make changes to medical advance directive?  No - Patient declined No - Patient declined  No - Patient declined    Copy of HFranklinin Chart? No - copy requested No - copy requested   No - copy requested No - copy requested No - copy requested    Current Medications (verified) Outpatient Encounter Medications as of 11/09/2021  Medication Sig   Cholecalciferol (VITAMIN D3) 50 MCG (2000 UT) capsule Take 1 capsule (2,000 Units total) by mouth daily.   Cyanocobalamin (B-12) 2500 MCG TABS Take 2,500 mcg by mouth daily.   diclofenac sodium (VOLTAREN) 1 % GEL Apply 1 application topically 4 (four) times daily. (Patient taking differently: Apply 1 application  topically 4 (four) times daily as needed (pain).)   esomeprazole (NEXIUM) 40 MG capsule TAKE ONE CAPSULE BY MOUTH EVERY MORNING  BEFORE BREAKFAST   fluticasone (FLONASE) 50 MCG/ACT nasal spray Place 2 sprays into both nostrils daily. Annual appt due in Dec must see provider for future refills   Magnesium 250 MG TABS Take 250 mg by mouth daily.   Melatonin 10 MG CAPS Take 10 mg by mouth at bedtime as needed (sleep).   Menaquinone-7 (VITAMIN K2 PO) Take 125 mg by mouth daily.   olmesartan-hydrochlorothiazide (BENICAR HCT) 20-12.5 MG tablet Take 1 tablet by mouth daily.   sildenafil (VIAGRA) 25 MG tablet Take 25 mg by mouth daily as needed for erectile dysfunction.   traMADol (ULTRAM) 50 MG tablet Take 1-2 tablets (50-100 mg total) by mouth every 6 (six) hours as needed for moderate pain or severe pain.   zolpidem (AMBIEN) 10 MG tablet TAKE ONE TABLET BY MOUTH EVERY NIGHT AT BEDTIME   [DISCONTINUED] Bacillus Coagulans-Inulin (ALIGN PREBIOTIC-PROBIOTIC PO) Take 1 capsule by mouth at bedtime.   [DISCONTINUED] diphenhydrAMINE HCl (ZZZQUIL) 50 MG/30ML LIQD Take 50 mg by mouth at bedtime as needed (sleep).   [DISCONTINUED] Omega-3 Fatty Acids (FISH OIL) 1000 MG CAPS Take 2 capsules (2,000 mg total) by mouth daily.   [DISCONTINUED] predniSONE (DELTASONE) 10 MG tablet 3 tabs by mouth per day for 3 days,2tabs per day for 3 days,1tab per day for 3 days   [DISCONTINUED] traMADol (ULTRAM) 50 MG tablet Take 1 tablet (50 mg total) by mouth every 6 (six) hours as needed.  No facility-administered encounter medications on file as of 11/09/2021.    Allergies (verified) Levofloxacin, Omeprazole, and Rapaflo [silodosin]   History: Past Medical History:  Diagnosis Date   Allergic rhinitis    Arthritis    BPH (benign prostatic hyperplasia)    Cancer (HCC)    kidney    Cataract    bilateral    Cervical spinal stenosis    Dr Dohmier   Colon polyp    Diverticulosis of colon    ED (erectile dysfunction)    Elevated PSA    GERD (gastroesophageal reflux disease)    Hemorrhoids    HTN (hypertension)    Microcytosis    Past  Surgical History:  Procedure Laterality Date   cataract surg     BIL   HEMORRHOID SURGERY     LAPAROSCOPIC NEPHRECTOMY Left 12/22/2019   Procedure: LAPAROSCOPIC RADICAL NEPHRECTOMY;  Surgeon: Raynelle Bring, MD;  Location: [facility];  Service: Urology;  Laterality: Left;   TOENAIL EXCISION  2009   ALL   TONSILLECTOMY     VASECTOMY     WISDOM TOOTH EXTRACTION     Family History  Problem Relation Age of Onset   Alzheimer's disease Mother    Heart disease Father    Alzheimer's disease Brother    Hypertension Other    Colon cancer Neg Hx    Esophageal cancer Neg Hx    Stomach cancer Neg Hx    Rectal cancer Neg Hx    Social History   Socioeconomic History   Marital status: Married    Spouse name: Not on file   Number of children: 2   Years of education: Not on file   Highest education level: Not on file  Occupational History   Occupation: Publisher/ retired    Comment: Racing cars  Tobacco Use   Smoking status: Never   Smokeless tobacco: Never  Vaping Use   Vaping Use: Never used  Substance and Sexual Activity   Alcohol use: No    Alcohol/week: 0.0 standard drinks of alcohol   Drug use: No   Sexual activity: Yes  Other Topics Concern   Not on file  Social History Narrative      Social Determinants of Health   Financial Resource Strain: Low Risk  (11/09/2021)   Overall Financial Resource Strain (CARDIA)    Difficulty of Paying Living Expenses: Not hard at all  Food Insecurity: No Food Insecurity (11/09/2021)   Hunger Vital Sign    Worried About Running Out of Food in the Last Year: Never true    Ran Out of Food in the Last Year: Never true  Transportation Needs: No Transportation Needs (11/09/2021)   PRAPARE - Hydrologist (Medical): No    Lack of Transportation (Non-Medical): No  Physical Activity: Sufficiently Active (11/09/2021)   Exercise Vital Sign    Days of Exercise per Week: 5 days    Minutes of Exercise per Session: 30 min   Stress: No Stress Concern Present (11/09/2021)   [place]    Feeling of Stress : Not at all  Social Connections: [place] (11/09/2021)   Social Connection and Isolation Panel [NHANES]    Frequency of Communication with Friends and Family: More than three times a week    Frequency of Social Gatherings with Friends and Family: More than three times a week    Attends Religious Services: 1 to 4 times per year  Active Member of Clubs or Organizations: No    Attends Archivist Meetings: 1 to 4 times per year    Marital Status: Married    Tobacco Counseling Counseling given: Not Answered   Clinical Intake:  Pre-visit preparation completed: Yes  Pain : No/denies pain Pain Score: 0-No pain     Nutritional Risks: None Diabetes: No  How often do you need to have someone help you when you read instructions, pamphlets, or other written materials from your doctor or pharmacy?: 1 - Never What is the last grade level you completed in school?: HSG  Diabetic? no  Interpreter Needed?: No  Information entered by :: Lisette Abu, LPN.   Activities of Daily Living    11/09/2021    3:24 PM  In your present state of health, do you have any difficulty performing the following activities:  Hearing? 0  Vision? 0  Difficulty concentrating or making decisions? 0  Walking or climbing stairs? 0  Dressing or bathing? 0  Doing errands, shopping? 0  Preparing Food and eating ? N  Using the Toilet? N  In the past six months, have you accidently leaked urine? N  Do you have problems with loss of bowel control? N  Managing your Medications? N  Managing your Finances? N  Housekeeping or managing your Housekeeping? N    Patient Care Team: Plotnikov, Evie Lacks, MD as PCP - General Dahlstedt, Annie Main, MD (Urology) Rutherford Guys, MD (Ophthalmology) Sable Feil, MD as Attending Physician  (Gastroenterology) Raynelle Bring, MD as Consulting Physician (Urology) Augustina Mood, DDS as Referring Physician (Dentistry) Joylene Grapes Shaune Pollack, MD as Consulting Physician (Nephrology)  Indicate any recent Medical Services you may have received from other than [facility] providers in the past year (date may be approximate).     Assessment:   This is a routine wellness examination for [place].  Hearing/Vision screen Hearing Screening - Comments:: Some hearing difficulties.  No hearing aids. Vision Screening - Comments:: Cataracts removed; Annual eye exam done by Dr. Rutherford Guys  Dietary issues and exercise activities discussed: Current Exercise Habits: Home exercise routine, Type of exercise: walking, Time (Minutes): 30, Frequency (Times/Week): 5, Weekly Exercise (Minutes/Week): 150, Intensity: Moderate, Exercise limited by: orthopedic condition(s)   Goals Addressed             This Visit's Progress    My goal is to stay alive and healthy.        Depression Screen    11/09/2021    3:22 PM 04/13/2021    4:06 PM 07/20/2020   12:59 PM 07/03/2019    9:07 AM 07/02/2018    8:21 AM 06/22/2017    8:35 AM 06/22/2015    8:19 AM  PHQ 2/9 Scores  PHQ - 2 Score 0 0 0 0 0 0 0    Fall Risk    11/09/2021    3:24 PM 04/13/2021    4:06 PM 07/20/2020    3:24 PM 07/04/2019    9:08 AM 07/03/2019    9:07 AM  [place] in the past year? 0 0 0 0 0  Number falls in past yr: 0  0 0 0  Injury with Fall? 0  0 0 0  Risk for fall due to : No Fall Risks  History of fall(s) No Fall Risks   Follow up Falls prevention discussed   Falls evaluation completed;Education provided     FALL RISK PREVENTION PERTAINING TO THE HOME:  Any stairs in or  around the home? Yes  If so, are there any without handrails? No  Home free of loose throw rugs in walkways, pet beds, electrical cords, etc? Yes  Adequate lighting in your home to reduce risk of falls? Yes   ASSISTIVE DEVICES UTILIZED TO PREVENT FALLS:  Life  alert? Yes  (Apple Watch) Use of a cane, walker or w/c? No  Grab bars in the bathroom? Yes  Shower chair or bench in shower? Yes  Elevated toilet seat or a handicapped toilet? Yes   TIMED UP AND GO:  Was the test performed? Yes .  Length of time to ambulate 10 feet: 6 sec.   Gait steady and fast without use of assistive device  Cognitive Function:    07/02/2018    8:26 AM 06/22/2017    8:43 AM  MMSE - Mini Mental State Exam  Orientation to time 5 5  Orientation to Place 5 5  Registration 3 3  Attention/ Calculation 5 5  Recall 3 3  Language- name 2 objects 2 2  Language- repeat 1 1  Language- follow 3 step command 3 3  Language- read & follow direction 1 1  Write a sentence 1 1  Copy design 1 1  Total score 30 30        11/09/2021    3:24 PM  6CIT Screen  What Year? 0 points  What month? 0 points  What time? 0 points  Count back from 20 0 points  Months in reverse 0 points  Repeat phrase 0 points  Total Score 0 points    Immunizations Immunization History  Administered Date(s) Administered   Influenza, High Dose Seasonal PF 12/12/2014, 12/11/2017, 12/11/2017   Influenza,inj,Quad PF,6+ Mos 11/28/2016   Influenza-Unspecified 11/06/2012, 11/06/2013, 11/13/2019, 10/29/2020   Moderna Sars-Covid-2 Vaccination 03/06/2019, 04/03/2019   Pneumococcal Conjugate-13 03/24/2013   Pneumococcal Polysaccharide-23 04/23/2009, 06/22/2015   Td 05/16/2010   Tdap 11/25/2020   Zoster, Live 09/06/2007    TDAP status: Up to date  Flu Vaccine status: Due, Education has been provided regarding the importance of this vaccine. Advised may receive this vaccine at local pharmacy or Health Dept. Aware to provide a copy of the vaccination record if obtained from local pharmacy or Health Dept. Verbalized acceptance and understanding.  Pneumococcal vaccine status: Up to date  Covid-19 vaccine status: Completed vaccines  Qualifies for Shingles Vaccine? Yes   Zostavax completed Yes    Shingrix Completed?: No.    Education has been provided regarding the importance of this vaccine. Patient has been advised to call insurance company to determine out of pocket expense if they have not yet received this vaccine. Advised may also receive vaccine at local pharmacy or Health Dept. Verbalized acceptance and understanding.  Screening Tests Health Maintenance  Topic Date Due   Zoster Vaccines- Shingrix (1 of 2) Never done   COVID-19 Vaccine (3 - Moderna risk series) 05/01/2019   INFLUENZA VACCINE  09/06/2021   COLONOSCOPY (Pts 45-22yr Insurance coverage will need to be confirmed)  02/19/2024   TETANUS/TDAP  11/26/2030   Pneumonia Vaccine 76 Years old  Completed   Hepatitis C Screening  Completed   HPV VACCINES  Aged Out    Health Maintenance  Health Maintenance Due  Topic Date Due   Zoster Vaccines- Shingrix (1 of 2) Never done   COVID-19 Vaccine (3 - Moderna risk series) 05/01/2019   INFLUENZA VACCINE  09/06/2021    Colorectal cancer screening: Type of screening: Colonoscopy. Completed 02/19/2019. Repeat every 5  years  Lung Cancer Screening: (Low Dose CT Chest recommended if Age 85-80 years, 30 pack-year currently smoking OR have quit w/in 15years.) does not qualify.   Lung Cancer Screening Referral: no  Additional Screening:  Hepatitis C Screening: does qualify; Completed 06/22/2015  Vision Screening: Recommended annual ophthalmology exams for early detection of glaucoma and other disorders of the eye. Is the patient up to date with their annual eye exam?  Yes  Who is the provider or what is the name of the office in which the patient attends annual eye exams? Rutherford Guys, MD If pt is not established with a provider, would they like to be referred to a provider to establish care? No .   Dental Screening: Recommended annual dental exams for proper oral hygiene  Community Resource Referral / Chronic Care Management: CRR required this visit?  No   CCM required  this visit?  No      Plan:     I have personally reviewed and noted the following in the patient's chart:   Medical and social history Use of alcohol, tobacco or illicit drugs  Current medications and supplements including opioid prescriptions. Patient is not currently taking opioid prescriptions. Functional ability and status Nutritional status Physical activity Advanced directives List of other physicians Hospitalizations, surgeries, and ER visits in previous 12 months Vitals Screenings to include cognitive, depression, and falls Referrals and appointments  In addition, I have reviewed and discussed with patient certain preventive protocols, quality metrics, and best practice recommendations. A written personalized care plan for preventive services as well as general preventive health recommendations were provided to patient.     Sheral Flow, LPN   08/11/2261   Nurse Notes: N/A

## 2021-11-15 ENCOUNTER — Telehealth: Payer: Self-pay | Admitting: *Deleted

## 2021-11-15 NOTE — Telephone Encounter (Signed)
Called pt inform him of MD response.Chad KitchenJohny Chess

## 2021-11-15 NOTE — Telephone Encounter (Signed)
-----   Message from Sheral Flow, LPN sent at 22/03/5425  8:41 AM EDT ----- Regarding: FW: FYI. Orelia Brandstetter,   Can you inform patient please?  Mignon Pine ----- Message ----- From: Cassandria Anger, MD Sent: 11/15/2021   7:37 AM EDT To: Sheral Flow, LPN Subject: RE: Juluis Rainier.                                       Ed will have to continue with Nexium or another Nexium-like medication (ie Aciphex, Protonix) due to his history of esophageal strictures. Thank you ----- Message ----- From: Sheral Flow, LPN Sent: 07/08/3760   4:11 PM EDT To: Cassandria Anger, MD Subject: Juluis Rainier.                                           Patient would like to know if he should continue Nexium.  He has been on it for 3 years now.  Concerned about add'l kidney issues.

## 2021-11-21 DIAGNOSIS — D225 Melanocytic nevi of trunk: Secondary | ICD-10-CM | POA: Diagnosis not present

## 2021-11-21 DIAGNOSIS — Z85828 Personal history of other malignant neoplasm of skin: Secondary | ICD-10-CM | POA: Diagnosis not present

## 2021-11-21 DIAGNOSIS — L570 Actinic keratosis: Secondary | ICD-10-CM | POA: Diagnosis not present

## 2021-11-21 DIAGNOSIS — D1801 Hemangioma of skin and subcutaneous tissue: Secondary | ICD-10-CM | POA: Diagnosis not present

## 2021-11-21 DIAGNOSIS — L853 Xerosis cutis: Secondary | ICD-10-CM | POA: Diagnosis not present

## 2021-11-21 DIAGNOSIS — L309 Dermatitis, unspecified: Secondary | ICD-10-CM | POA: Diagnosis not present

## 2021-11-21 DIAGNOSIS — I788 Other diseases of capillaries: Secondary | ICD-10-CM | POA: Diagnosis not present

## 2021-11-21 DIAGNOSIS — L814 Other melanin hyperpigmentation: Secondary | ICD-10-CM | POA: Diagnosis not present

## 2021-11-21 DIAGNOSIS — L821 Other seborrheic keratosis: Secondary | ICD-10-CM | POA: Diagnosis not present

## 2021-12-19 DIAGNOSIS — N5201 Erectile dysfunction due to arterial insufficiency: Secondary | ICD-10-CM | POA: Diagnosis not present

## 2021-12-19 DIAGNOSIS — R972 Elevated prostate specific antigen [PSA]: Secondary | ICD-10-CM | POA: Diagnosis not present

## 2021-12-19 DIAGNOSIS — Z85528 Personal history of other malignant neoplasm of kidney: Secondary | ICD-10-CM | POA: Diagnosis not present

## 2022-01-09 ENCOUNTER — Other Ambulatory Visit: Payer: Self-pay | Admitting: Internal Medicine

## 2022-01-10 ENCOUNTER — Other Ambulatory Visit: Payer: Self-pay | Admitting: Internal Medicine

## 2022-01-20 ENCOUNTER — Other Ambulatory Visit: Payer: Self-pay | Admitting: Internal Medicine

## 2022-04-05 ENCOUNTER — Other Ambulatory Visit: Payer: Self-pay | Admitting: Internal Medicine

## 2022-04-06 ENCOUNTER — Telehealth: Payer: Self-pay | Admitting: Internal Medicine

## 2022-04-06 MED ORDER — ESOMEPRAZOLE MAGNESIUM 40 MG PO CPDR: DELAYED_RELEASE_CAPSULE | ORAL | 0 refills | Status: DC

## 2022-04-06 MED ORDER — OLMESARTAN MEDOXOMIL-HCTZ 20-12.5 MG PO TABS: 1.0000 | ORAL_TABLET | Freq: Every day | ORAL | 0 refills | Status: DC

## 2022-04-06 NOTE — Telephone Encounter (Signed)
Patient called and said his medication was declined. He would like to know why olmesartan-hydrochlorothiazide (BENICAR HCT) 20-12.5 MG tablet  wasn't able to be filled at [facility] [id number] - [place], [address]  Best callback is [phone].

## 2022-04-06 NOTE — Telephone Encounter (Signed)
Pt has not seen the provider since 2022. He will need ov before medication can be rx.Marland KitchenJohny Chess

## 2022-04-06 NOTE — Telephone Encounter (Signed)
Sent refill to [facility] until appt.Marland KitchenJohny Chess

## 2022-04-06 NOTE — Telephone Encounter (Signed)
Patient has been scheduled for 05/16/22. He would like to know if his medication could be filled for the duration until the appointment.

## 2022-04-07 ENCOUNTER — Other Ambulatory Visit: Payer: Self-pay | Admitting: Internal Medicine

## 2022-05-07 ENCOUNTER — Other Ambulatory Visit: Payer: Self-pay | Admitting: Internal Medicine

## 2022-05-16 ENCOUNTER — Ambulatory Visit (INDEPENDENT_AMBULATORY_CARE_PROVIDER_SITE_OTHER): Payer: Medicare Other | Admitting: Internal Medicine

## 2022-05-16 ENCOUNTER — Ambulatory Visit (INDEPENDENT_AMBULATORY_CARE_PROVIDER_SITE_OTHER): Payer: Medicare Other

## 2022-05-16 ENCOUNTER — Encounter: Payer: Self-pay | Admitting: Internal Medicine

## 2022-05-16 VITALS — BP 110/70 | HR 72 | Temp 97.9°F | Ht 68.0 in | Wt 180.0 lb

## 2022-05-16 DIAGNOSIS — I10 Essential (primary) hypertension: Secondary | ICD-10-CM | POA: Diagnosis not present

## 2022-05-16 DIAGNOSIS — J301 Allergic rhinitis due to pollen: Secondary | ICD-10-CM

## 2022-05-16 DIAGNOSIS — I2583 Coronary atherosclerosis due to lipid rich plaque: Secondary | ICD-10-CM

## 2022-05-16 DIAGNOSIS — N183 Chronic kidney disease, stage 3 unspecified: Secondary | ICD-10-CM

## 2022-05-16 DIAGNOSIS — M542 Cervicalgia: Secondary | ICD-10-CM | POA: Diagnosis not present

## 2022-05-16 DIAGNOSIS — M5412 Radiculopathy, cervical region: Secondary | ICD-10-CM | POA: Diagnosis not present

## 2022-05-16 DIAGNOSIS — E785 Hyperlipidemia, unspecified: Secondary | ICD-10-CM

## 2022-05-16 DIAGNOSIS — M79602 Pain in left arm: Secondary | ICD-10-CM

## 2022-05-16 DIAGNOSIS — C642 Malignant neoplasm of left kidney, except renal pelvis: Secondary | ICD-10-CM

## 2022-05-16 DIAGNOSIS — R0981 Nasal congestion: Secondary | ICD-10-CM

## 2022-05-16 DIAGNOSIS — I2510 Atherosclerotic heart disease of native coronary artery without angina pectoris: Secondary | ICD-10-CM

## 2022-05-16 LAB — COMPREHENSIVE METABOLIC PANEL
ALT: 14 U/L (ref 0–53)
AST: 17 U/L (ref 0–37)
Albumin: 4.7 g/dL (ref 3.5–5.2)
Alkaline Phosphatase: 76 U/L (ref 39–117)
BUN: 20 mg/dL (ref 6–23)
CO2: 30 mEq/L (ref 19–32)
Calcium: 9.7 mg/dL (ref 8.4–10.5)
Chloride: 97 mEq/L (ref 96–112)
Creatinine, Ser: 1.95 mg/dL — ABNORMAL HIGH (ref 0.40–1.50)
GFR: 32.3 mL/min — ABNORMAL LOW (ref >60.00)
Glucose, Bld: 87 mg/dL (ref 70–99)
Potassium: 4.4 mEq/L (ref 3.5–5.1)
Sodium: 137 mEq/L (ref 135–145)
Total Bilirubin: 0.7 mg/dL (ref 0.2–1.2)
Total Protein: 7.6 g/dL (ref 6.0–8.3)

## 2022-05-16 LAB — CBC WITH DIFFERENTIAL/PLATELET
Basophils Absolute: 0.1 10*3/uL (ref 0.0–0.1)
Basophils Relative: 0.7 % (ref 0.0–3.0)
Eosinophils Absolute: 0.2 10*3/uL (ref 0.0–0.7)
Eosinophils Relative: 2.1 % (ref 0.0–5.0)
HCT: 41.3 % (ref 39.0–52.0)
Hemoglobin: 13 g/dL (ref 13.0–17.0)
Lymphocytes Relative: 13.8 % (ref 12.0–46.0)
Lymphs Abs: 1.3 10*3/uL (ref 0.7–4.0)
MCHC: 31.5 g/dL (ref 30.0–36.0)
MCV: 58.4 fl — ABNORMAL LOW (ref 78.0–100.0)
Monocytes Absolute: 0.8 10*3/uL (ref 0.1–1.0)
Monocytes Relative: 7.9 % (ref 3.0–12.0)
Neutro Abs: 7.2 10*3/uL (ref 1.4–7.7)
Neutrophils Relative %: 75.5 % (ref 43.0–77.0)
Platelets: 185 10*3/uL (ref 150.0–400.0)
RBC: 7.08 Mil/uL — ABNORMAL HIGH (ref 4.22–5.81)
RDW: 16.6 % — ABNORMAL HIGH (ref 11.5–15.5)
WBC: 9.5 10*3/uL (ref 4.0–10.5)

## 2022-05-16 LAB — LIPID PANEL
Cholesterol: 201 mg/dL — ABNORMAL HIGH (ref 0–200)
HDL: 40.2 mg/dL (ref >39.00)
LDL Cholesterol: 131 mg/dL — ABNORMAL HIGH (ref 0–99)
NonHDL: 160.52
Total CHOL/HDL Ratio: 5
Triglycerides: 147 mg/dL (ref 0.0–149.0)
VLDL: 29.4 mg/dL (ref 0.0–40.0)

## 2022-05-16 LAB — POC COVID19 BINAXNOW: SARS Coronavirus 2 Ag: NEGATIVE

## 2022-05-16 LAB — TSH: TSH: 1.06 u[IU]/mL (ref 0.35–5.50)

## 2022-05-16 MED ORDER — METHYLPREDNISOLONE 4 MG PO TBPK
ORAL_TABLET | ORAL | 0 refills | Status: DC
Start: 2022-05-16 — End: 2022-07-26

## 2022-05-16 MED ORDER — FINASTERIDE 5 MG PO TABS: 5.0000 mg | ORAL_TABLET | Freq: Every day | ORAL | 3 refills | Status: DC

## 2022-05-16 MED ORDER — ZOLPIDEM TARTRATE 10 MG PO TABS: 10.0000 mg | ORAL_TABLET | Freq: Every day | ORAL | 1 refills | Status: DC

## 2022-05-16 MED ORDER — FLUTICASONE PROPIONATE 50 MCG/ACT NA SUSP: 2.0000 | Freq: Every day | NASAL | 0 refills | Status: DC

## 2022-05-16 MED ORDER — NITROGLYCERIN 0.4 MG SL SUBL: 0.4000 mg | SUBLINGUAL_TABLET | SUBLINGUAL | 3 refills | Status: DC | PRN

## 2022-05-16 MED ORDER — OLMESARTAN MEDOXOMIL-HCTZ 20-12.5 MG PO TABS: 1.0000 | ORAL_TABLET | Freq: Every day | ORAL | 3 refills | Status: DC

## 2022-05-16 MED ORDER — ESOMEPRAZOLE MAGNESIUM 40 MG PO CPDR: DELAYED_RELEASE_CAPSULE | ORAL | 3 refills | Status: DC

## 2022-05-16 NOTE — Assessment & Plan Note (Signed)
On Benicar HCT

## 2022-05-16 NOTE — Assessment & Plan Note (Addendum)
Probable cervical radiculopathy. Medrol pac. C spine X ray Less likely angina: NTG prn. Cardiology ref 2021 Coronary calcium CT  score of 180 Agatston

## 2022-05-16 NOTE — Assessment & Plan Note (Signed)
Worse Medrol pac should help COVID (-)

## 2022-05-16 NOTE — Assessment & Plan Note (Signed)
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 CT  pending

## 2022-05-16 NOTE — Assessment & Plan Note (Signed)
L arm pain w/exertion - new Pain in the L arm w/activity, tingling 2021, 2023 Start krill oil Consider statins, baby ASA - not interested due to one kidney.

## 2022-05-16 NOTE — Assessment & Plan Note (Signed)
L arm pain w/exertion - new Pain in the L arm w/activity, tingling 2021, 2023

## 2022-05-16 NOTE — Progress Notes (Signed)
Subjective:  Patient ID: Chad Palmer, male    DOB: 09/29/1945  Age: 77 y.o. MRN: [id number]  CC: Medication Refill (Sinus pressure, Lt ear pain and itch and pain in lt arm)   HPI Chad Palmer presents for URI sx's x 3-4 d, cough F/u insomnia, B12 def, GERD, HTN C/p pain in the L arm w/activity, tingling 2021, 2023  Outpatient Medications Prior to Visit  Medication Sig Dispense Refill   Cholecalciferol (VITAMIN D3) 50 MCG (2000 UT) capsule Take 1 capsule (2,000 Units total) by mouth daily. 100 capsule 3   Cyanocobalamin (B-12) 2500 MCG TABS Take 2,500 mcg by mouth daily.     diclofenac sodium (VOLTAREN) 1 % GEL Apply 1 application topically 4 (four) times daily. (Patient taking differently: Apply 1 application  topically 4 (four) times daily as needed (pain).) 100 g 3   Magnesium 250 MG TABS Take 250 mg by mouth daily.     Melatonin 10 MG CAPS Take 10 mg by mouth at bedtime as needed (sleep).     Menaquinone-7 (VITAMIN K2 PO) Take 125 mg by mouth daily.     Probiotic Product (ALIGN KIDS PROBIOTIC PO) Take by mouth.     sildenafil (VIAGRA) 25 MG tablet Take 25 mg by mouth daily as needed for erectile dysfunction.     traMADol (ULTRAM) 50 MG tablet Take 1-2 tablets (50-100 mg total) by mouth every 6 (six) hours as needed for moderate pain or severe pain. 20 tablet 0   esomeprazole (NEXIUM) 40 MG capsule TAKE ONE CAPSULE BY MOUTH EVERY MORNING BEFORE BREAKFAST 30 capsule 0   finasteride (PROSCAR) 5 MG tablet Take 5 mg by mouth daily.     fluticasone (FLONASE) 50 MCG/ACT nasal spray Place 2 sprays into both nostrils daily. Annual appt is due must see provider for future refills 16 mL 0   olmesartan-hydrochlorothiazide (BENICAR HCT) 20-12.5 MG tablet Take 1 tablet by mouth daily. Annual appt is due must see provider for future refills 30 tablet 0   zolpidem (AMBIEN) 10 MG tablet TAKE ONE TABLET BY MOUTH EVERY NIGHT AT BEDTIME 90 tablet 1   No facility-administered medications prior to  visit.    ROS: Review of Systems  Constitutional:  Negative for appetite change, fatigue and unexpected weight change.  HENT:  Positive for congestion. Negative for nosebleeds, sneezing, sore throat and trouble swallowing.   Eyes:  Negative for itching and visual disturbance.  Respiratory:  Negative for cough.   Cardiovascular:  Negative for chest pain, palpitations and leg swelling.  Gastrointestinal:  Negative for abdominal distention, blood in stool, diarrhea and nausea.  Genitourinary:  Negative for frequency and hematuria.  Musculoskeletal:  Negative for back pain, gait problem, joint swelling and neck pain.  Skin:  Negative for rash.  Neurological:  Negative for dizziness, tremors, speech difficulty and weakness.  Hematological:  Does not bruise/bleed easily.  Psychiatric/Behavioral:  Positive for sleep disturbance. Negative for agitation, dysphoric mood and suicidal ideas. The patient is not nervous/anxious.     Objective:  BP 110/70 (BP Location: Left Arm, Patient Position: Sitting, Cuff Size: Large)   Pulse 72   Temp 97.9 F (36.6 C) (Oral)   Ht 5\' 8"  (1.727 m)   Wt 180 lb (81.6 kg)   SpO2 99%   BMI 27.37 kg/m   BP Readings from Last 3 Encounters:  05/16/22 110/70  11/09/21 118/70  06/13/21 132/82    Wt Readings from Last 3 Encounters:  05/16/22 180 lb (81.6 kg)  11/09/21 180 lb 9.6 oz (81.9 kg)  06/13/21 176 lb (79.8 kg)    Physical Exam Constitutional:      General: He is not in acute distress.    Appearance: Normal appearance. He is well-developed.     Comments: NAD  Eyes:     Conjunctiva/sclera: Conjunctivae normal.     Pupils: Pupils are equal, round, and reactive to light.  Neck:     Thyroid: No thyromegaly.     Vascular: No JVD.  Cardiovascular:     Rate and Rhythm: Normal rate and regular rhythm.     Heart sounds: Normal heart sounds. No murmur heard.    No friction rub. No gallop.  Pulmonary:     Effort: Pulmonary effort is normal. No  respiratory distress.     Breath sounds: Normal breath sounds. No wheezing or rales.  Chest:     Chest wall: No tenderness.  Abdominal:     General: Bowel sounds are normal. There is no distension.     Palpations: Abdomen is soft. There is no mass.     Tenderness: There is no abdominal tenderness. There is no guarding or rebound.  Musculoskeletal:        General: No tenderness. Normal range of motion.     Cervical back: Normal range of motion.  Lymphadenopathy:     Cervical: No cervical adenopathy.  Skin:    General: Skin is warm and dry.     Findings: No rash.  Neurological:     Mental Status: He is alert and oriented to person, place, and time.     Cranial Nerves: No cranial nerve deficit.     Motor: No abnormal muscle tone.     Coordination: Coordination normal.     Gait: Gait normal.     Deep Tendon Reflexes: Reflexes are normal and symmetric.  Psychiatric:        Behavior: Behavior normal.        Thought Content: Thought content normal.        Judgment: Judgment normal.   Stiff neck MS , grip nl B  Lab Results  Component Value Date   WBC 7.1 01/13/2020   HGB 12.1 (L) 01/13/2020   HCT 38.8 (L) 01/13/2020   PLT 196.0 01/13/2020   GLUCOSE 95 01/26/2021   CHOL 202 (H) 01/26/2021   TRIG 127.0 01/26/2021   HDL 45.10 01/26/2021   LDLCALC 132 (H) 01/26/2021   ALT 15 01/26/2021   AST 18 01/26/2021   NA 138 01/26/2021   K 4.8 01/26/2021   CL 100 01/26/2021   CREATININE 2.24 (H) 01/26/2021   BUN 28 (H) 01/26/2021   CO2 32 01/26/2021   TSH 2.02 11/13/2019   PSA 10.49 (H) 06/27/2017   HGBA1C 5.9 02/19/2020    DG Chest 2 View  Result Date: 12/08/2020 CLINICAL DATA:  Renal malignancy EXAM: CHEST - 2 VIEW COMPARISON:  04/22/2015 known pulmonary nodules FINDINGS: The heart size and mediastinal contours are within normal limits. Both lungs are clear. Known pulmonary nodules are not well seen on this radiograph due to their small size. The visualized skeletal structures are  unremarkable. IMPRESSION: No acute cardiopulmonary disease. Electronically Signed   By: Acquanetta BellingFarhaan  Mir M.D.   On: 12/08/2020 09:31    Assessment & Plan:   Problem List Items Addressed This Visit       Cardiovascular and Mediastinum   Coronary atherosclerosis    L arm pain w/exertion - new Pain in the L arm w/activity, tingling 2021,  2023 Start krill oil Consider statins, baby ASA - not interested due to one kidney.      Relevant Medications   olmesartan-hydrochlorothiazide (BENICAR HCT) 20-12.5 MG tablet   Other Relevant Orders   Ambulatory referral to Cardiology   Essential hypertension    On Benicar HCT      Relevant Medications   olmesartan-hydrochlorothiazide (BENICAR HCT) 20-12.5 MG tablet     Nervous and Auditory   Radiculitis of right cervical region    L arm pain w/exertion - new Pain in the L arm w/activity, tingling 2021, 2023      Relevant Medications   zolpidem (AMBIEN) 10 MG tablet     Genitourinary   CRI (chronic renal insufficiency), stage 3 (moderate)    Statins, baby ASA - not interested due to one kidney. CT pending        Other   Arm pain, diffuse, left - Primary    2021 Coronary calcium CT  score of 180 Agatston       Relevant Orders   DG Cervical Spine Complete   Ambulatory referral to Cardiology      Meds ordered this encounter  Medications   esomeprazole (NEXIUM) 40 MG capsule    Sig: TAKE ONE CAPSULE BY MOUTH EVERY MORNING BEFORE BREAKFAST    Dispense:  90 capsule    Refill:  3    Please keep appt in April for future refills   olmesartan-hydrochlorothiazide (BENICAR HCT) 20-12.5 MG tablet    Sig: Take 1 tablet by mouth daily. Annual appt is due must see provider for future refills    Dispense:  90 tablet    Refill:  3   finasteride (PROSCAR) 5 MG tablet    Sig: Take 1 tablet (5 mg total) by mouth daily.    Dispense:  90 tablet    Refill:  3   fluticasone (FLONASE) 50 MCG/ACT nasal spray    Sig: Place 2 sprays into both  nostrils daily. Annual appt is due must see provider for future refills    Dispense:  16 mL    Refill:  0   zolpidem (AMBIEN) 10 MG tablet    Sig: Take 1 tablet (10 mg total) by mouth at bedtime.    Dispense:  90 tablet    Refill:  1      Follow-up: No follow-ups on file.  Sonda Primes, MD

## 2022-05-16 NOTE — Assessment & Plan Note (Signed)
Statins, baby ASA - not interested due to one kidney. CT pending

## 2022-05-17 LAB — URINALYSIS
Bilirubin Urine: NEGATIVE
Hgb urine dipstick: NEGATIVE
Ketones, ur: NEGATIVE
Leukocytes,Ua: NEGATIVE
Nitrite: NEGATIVE
Specific Gravity, Urine: 1.01 (ref 1.000–1.030)
Total Protein, Urine: NEGATIVE
Urine Glucose: NEGATIVE
Urobilinogen, UA: 0.2 (ref 0.0–1.0)
pH: 6 (ref 5.0–8.0)

## 2022-06-19 DIAGNOSIS — R972 Elevated prostate specific antigen [PSA]: Secondary | ICD-10-CM | POA: Diagnosis not present

## 2022-06-20 DIAGNOSIS — H524 Presbyopia: Secondary | ICD-10-CM | POA: Diagnosis not present

## 2022-06-20 DIAGNOSIS — Z961 Presence of intraocular lens: Secondary | ICD-10-CM | POA: Diagnosis not present

## 2022-06-21 DIAGNOSIS — C642 Malignant neoplasm of left kidney, except renal pelvis: Secondary | ICD-10-CM | POA: Diagnosis not present

## 2022-06-21 DIAGNOSIS — K5730 Diverticulosis of large intestine without perforation or abscess without bleeding: Secondary | ICD-10-CM | POA: Diagnosis not present

## 2022-06-21 DIAGNOSIS — R918 Other nonspecific abnormal finding of lung field: Secondary | ICD-10-CM | POA: Diagnosis not present

## 2022-06-26 DIAGNOSIS — R350 Frequency of micturition: Secondary | ICD-10-CM | POA: Diagnosis not present

## 2022-06-26 DIAGNOSIS — C642 Malignant neoplasm of left kidney, except renal pelvis: Secondary | ICD-10-CM | POA: Diagnosis not present

## 2022-06-26 DIAGNOSIS — N401 Enlarged prostate with lower urinary tract symptoms: Secondary | ICD-10-CM | POA: Diagnosis not present

## 2022-06-26 DIAGNOSIS — R972 Elevated prostate specific antigen [PSA]: Secondary | ICD-10-CM | POA: Diagnosis not present

## 2022-07-07 ENCOUNTER — Encounter: Payer: Self-pay | Admitting: Internal Medicine

## 2022-07-07 ENCOUNTER — Telehealth: Payer: Self-pay

## 2022-07-07 ENCOUNTER — Ambulatory Visit: Payer: Medicare Other | Attending: Internal Medicine | Admitting: Internal Medicine

## 2022-07-07 VITALS — BP 128/76 | HR 76 | Ht 68.0 in | Wt 181.0 lb

## 2022-07-07 DIAGNOSIS — I2583 Coronary atherosclerosis due to lipid rich plaque: Secondary | ICD-10-CM | POA: Diagnosis not present

## 2022-07-07 DIAGNOSIS — I2510 Atherosclerotic heart disease of native coronary artery without angina pectoris: Secondary | ICD-10-CM | POA: Diagnosis not present

## 2022-07-07 DIAGNOSIS — M79602 Pain in left arm: Secondary | ICD-10-CM | POA: Diagnosis not present

## 2022-07-07 DIAGNOSIS — I10 Essential (primary) hypertension: Secondary | ICD-10-CM | POA: Insufficient documentation

## 2022-07-07 DIAGNOSIS — R0600 Dyspnea, unspecified: Secondary | ICD-10-CM

## 2022-07-07 MED ORDER — ATORVASTATIN CALCIUM 10 MG PO TABS: 10.0000 mg | ORAL_TABLET | Freq: Every day | ORAL | 3 refills | Status: DC

## 2022-07-07 NOTE — Patient Instructions (Addendum)
Medication Instructions: START LIPITOR 10 MG A DAY   *If you need a refill on your cardiac medications before your next appointment, please call your pharmacy*   Lab Work: CMET AND NMR IN 8 WEEKS   If you have labs (blood work) drawn today and your tests are completely normal, you will receive your results only by: MyChart Message (if you have MyChart) OR A paper copy in the mail If you have any lab test that is abnormal or we need to change your treatment, we will call you to review the results.   Testing/Procedures: How to Prepare for Your Cardiac PET/CT Stress Test:  1. Please do not take these medications before your test:   Medications that may interfere with the cardiac pharmacological stress agent (ex. nitrates - including erectile dysfunction medications, isosorbide mononitrate, tamulosin or beta-blockers) the day of the exam. (Erectile dysfunction medication should be held for at least 72 hrs prior to test) Your remaining medications may be taken with water.  2. Nothing to eat or drink, except water, 3 hours prior to arrival time.   NO caffeine/decaffeinated products, or chocolate 12 hours prior to arrival.  3. NO perfume, cologne or lotion  4. Total time is 1 to 2 hours; you may want to bring reading material for the waiting time.  5. Please report to Radiology at the [facility] Main Entrance 30 minutes early for your test.  [address] [place], [place] [address]   In preparation for your appointment, medication and supplies will be purchased.  Appointment availability is limited, so if you need to cancel or reschedule, please call the Radiology Department at [phone]  24 hours in advance to avoid a cancellation fee of $100.00  What to Expect After you Arrive:  Once you arrive and check in for your appointment, you will be taken to a preparation room within the Radiology Department.  A technologist or Nurse will obtain your medical history, verify  that you are correctly prepped for the exam, and explain the procedure.  Afterwards,  an IV will be started in your arm and electrodes will be placed on your skin for EKG monitoring during the stress portion of the exam. Then you will be escorted to the PET/CT scanner.  There, staff will get you positioned on the scanner and obtain a blood pressure and EKG.  During the exam, you will continue to be connected to the EKG and blood pressure machines.  A small, safe amount of a radioactive tracer will be injected in your IV to obtain a series of pictures of your heart along with an injection of a stress agent.    After your Exam:  It is recommended that you eat a meal and drink a caffeinated beverage to counter act any effects of the stress agent.  Drink plenty of fluids for the remainder of the day and urinate frequently for the first couple of hours after the exam.  Your doctor will inform you of your test results within 7-10 business days.  For questions about your test or how to prepare for your test, please call: Rockwell Alexandria, Cardiac Imaging Nurse Navigator  Larey Brick, Cardiac Imaging Nurse Navigator Office: [phone]    Follow-Up: At [facility], you and your health needs are our priority.  As part of our continuing mission to provide you with exceptional heart care, we have created designated Provider Care Teams.  These Care Teams include your primary Cardiologist (physician) and Advanced Practice  Providers (APPs -  Physician Assistants and Nurse Practitioners) who all work together to provide you with the care you need, when you need it.  We recommend signing up for the patient portal called "MyChart".  Sign up information is provided on this After Visit Summary.  MyChart is used to connect with patients for Virtual Visits (Telemedicine).  Patients are able to view lab/test results, encounter notes, upcoming appointments, etc.  Non-urgent messages can be sent to your provider  as well.   To learn more about what you can do with MyChart, go to [email or web address].    Your next appointment:   8 month(s)  Provider:   DR Dietrich Pates     Other Instructions

## 2022-07-07 NOTE — Progress Notes (Signed)
Cardiology Office Note   Date:  07/07/2022   ID:  Chad, Palmer [birth year], MRN [id number]  PCP:  Tresa Garter, MD  Cardiologist:   Dietrich Pates, MD   Pt referred for arm pain and atherosclerosis    History of Present Illness: Chad Palmer is a 77 y.o. male with a history of GERD, HTN.    Has had tingling and numbness in L arm in past   No pain  Seen by A Plotnikov in April 2024  Complained of L arm pain with exertion   New  Started in Feb  Can get with walking up a hill but not every   Occasionally when carrying things  Patient says it usually on underside of upper arm  Sometimes from shoulder sometimes from neck  Occasional tingling in hand  No PND   No episodes of arm pain at rest  No CP     Breathing is not as good as it used to be  Gets out of breath if walking, going up hil     Pt had L kidney removed 3 years ago   Feels his stamina is not as good as prior    Current Meds  Medication Sig   Cholecalciferol (VITAMIN D3) 50 MCG (2000 UT) capsule Take 1 capsule (2,000 Units total) by mouth daily.   Cyanocobalamin (B-12) 2500 MCG TABS Take 2,500 mcg by mouth daily.   diclofenac sodium (VOLTAREN) 1 % GEL Apply 1 application topically 4 (four) times daily. (Patient taking differently: Apply 1 application  topically 4 (four) times daily as needed (pain).)   esomeprazole (NEXIUM) 40 MG capsule TAKE ONE CAPSULE BY MOUTH EVERY MORNING BEFORE BREAKFAST   finasteride (PROSCAR) 5 MG tablet Take 1 tablet (5 mg total) by mouth daily.   fluticasone (FLONASE) 50 MCG/ACT nasal spray Place 2 sprays into both nostrils daily. Annual appt is due must see provider for future refills   Magnesium 250 MG TABS Take 250 mg by mouth daily.   Melatonin 10 MG CAPS Take 10 mg by mouth at bedtime as needed (sleep).   Menaquinone-7 (VITAMIN K2 PO) Take 125 mg by mouth daily.   methylPREDNISolone (MEDROL DOSEPAK) 4 MG TBPK tablet As directed   nitroGLYCERIN (NITROSTAT) 0.4 MG SL tablet Place 1  tablet (0.4 mg total) under the tongue every 5 (five) minutes as needed for chest pain.   olmesartan-hydrochlorothiazide (BENICAR HCT) 20-12.5 MG tablet Take 1 tablet by mouth daily. Annual appt is due must see provider for future refills   Probiotic Product (ALIGN KIDS PROBIOTIC PO) Take by mouth.   sildenafil (VIAGRA) 25 MG tablet Take 25 mg by mouth daily as needed for erectile dysfunction.   traMADol (ULTRAM) 50 MG tablet Take 1-2 tablets (50-100 mg total) by mouth every 6 (six) hours as needed for moderate pain or severe pain.   zolpidem (AMBIEN) 10 MG tablet Take 1 tablet (10 mg total) by mouth at bedtime.     Allergies:   Levofloxacin, Omeprazole, and Rapaflo [silodosin]   Past Medical History:  Diagnosis Date   Allergic rhinitis    Arthritis    BPH (benign prostatic hyperplasia)    Cancer (HCC)    kidney    Cataract    bilateral    Cervical spinal stenosis    Dr Dohmier   Colon polyp    Diverticulosis of colon    ED (erectile dysfunction)    Elevated PSA    GERD (gastroesophageal reflux disease)  Hemorrhoids    HTN (hypertension)    Microcytosis     Past Surgical History:  Procedure Laterality Date   cataract surg     BIL   HEMORRHOID SURGERY     LAPAROSCOPIC NEPHRECTOMY Left 12/22/2019   Procedure: LAPAROSCOPIC RADICAL NEPHRECTOMY;  Surgeon: Heloise Purpura, MD;  Location: [facility];  Service: Urology;  Laterality: Left;   TOENAIL EXCISION  2009   ALL   TONSILLECTOMY     VASECTOMY     WISDOM TOOTH EXTRACTION       Social History:  The patient  reports that he has never smoked. He has never used smokeless tobacco. He reports that he does not drink alcohol and does not use drugs.   Family History:  The patient's family history includes Alzheimer's disease in his brother and mother; Heart disease in his father; Hypertension in an other family member.    ROS:  Please see the history of present illness. All other systems are reviewed and  Negative to the above  problem except as noted.    PHYSICAL EXAM: VS:  BP 128/76   Pulse 76   Ht 5\' 8"  (1.727 m)   Wt 181 lb (82.1 kg)   SpO2 98%   BMI 27.52 kg/m   GEN: Well nourished, well developed, in no acute distress  HEENT: normal  Neck: no JVD, carotid bruits, or masses Cardiac: RRR; no murmurs, rubs, or gallops,no edema  Respiratory:  clear to auscultation bilaterally, normal work of breathing GI: soft, nontender, nondistended, + BS  No hepatomegaly  MS: no deformity Moving all extremities   Skin: warm and dry, no rash Neuro:  Strength and sensation are intact Psych: euthymic mood, full affect   EKG:  EKG is ordered today.  Sinus bradycardia  57   Occasional PACs   Possilbe septal MI   Calcium score CT  2021  Coronary calcium score of 180 Agatston units. This was 44th percentile for age and sex matched control, suggesting low to intermediate risk for future cardiac events.  Lipid Panel    Component Value Date/Time   CHOL 201 (H) 05/16/2022 1454   TRIG 147.0 05/16/2022 1454   HDL 40.20 05/16/2022 1454   CHOLHDL 5 05/16/2022 1454   VLDL 29.4 05/16/2022 1454   LDLCALC 131 (H) 05/16/2022 1454      Wt Readings from Last 3 Encounters:  07/07/22 181 lb (82.1 kg)  05/16/22 180 lb (81.6 kg)  11/09/21 180 lb 9.6 oz (81.9 kg)      ASSESSMENT AND PLAN:  1  Arm pain   Pt complains of arm pain with exertion    Erratic, does not occur all the time   Never at rest    Does have a hx of DJD but overall concerning   Denies CP With CAD noted I would recomm Lexiscan PET/CT to evaluate for ischemia Does note some DOE   May consider echo.  See what stress test shows first   2 CAD   Ca score in 2021 was 180   Modify risk factors  3  HL  last LDL 131  HDL 40   Start lipitor  Will check lipomed and CMET in 2 months   4  Renal  S/p nephrectomy for renal CA   Follow up labs this summer  Tentative follow up next winter, sooner based on test findings/clinical changes       Current medicines are  reviewed at length with the patient today.  The patient does  not have concerns regarding medicines.  Signed, Dietrich Pates, MD  07/07/2022 1:02 PM    [facility] [address] [place], [place], [place]  [address] Phone: [phone]; Fax: [phone]

## 2022-07-07 NOTE — Addendum Note (Signed)
Addended by: Bertram Millard on: 07/07/2022 02:11 PM   Modules accepted: Orders

## 2022-07-07 NOTE — H&P (View-Only) (Signed)
 Cardiology Office Note   Date:  07/07/2022   ID:  Chad Palmer, DOB 05/20/1945, MRN [id number]  PCP:  Plotnikov, Aleksei V, MD  Cardiologist:   Cori Henningsen, MD   Pt referred for arm pain and atherosclerosis    History of Present Illness: Chad Palmer is a 76 y.o. male with a history of GERD, HTN.    Has had tingling and numbness in L arm in past   No pain  Seen by A Plotnikov in April 2024  Complained of L arm pain with exertion   New  Started in Feb  Can get with walking up a hill but not every   Occasionally when carrying things  Patient says it usually on underside of upper arm  Sometimes from shoulder sometimes from neck  Occasional tingling in hand  No PND   No episodes of arm pain at rest  No CP     Breathing is not as good as it used to be  Gets out of breath if walking, going up hil     Pt had L kidney removed 3 years ago   Feels his stamina is not as good as prior    Current Meds  Medication Sig   Cholecalciferol (VITAMIN D3) 50 MCG (2000 UT) capsule Take 1 capsule (2,000 Units total) by mouth daily.   Cyanocobalamin (B-12) 2500 MCG TABS Take 2,500 mcg by mouth daily.   diclofenac sodium (VOLTAREN) 1 % GEL Apply 1 application topically 4 (four) times daily. (Patient taking differently: Apply 1 application  topically 4 (four) times daily as needed (pain).)   esomeprazole (NEXIUM) 40 MG capsule TAKE ONE CAPSULE BY MOUTH EVERY MORNING BEFORE BREAKFAST   finasteride (PROSCAR) 5 MG tablet Take 1 tablet (5 mg total) by mouth daily.   fluticasone (FLONASE) 50 MCG/ACT nasal spray Place 2 sprays into both nostrils daily. Annual appt is due must see provider for future refills   Magnesium 250 MG TABS Take 250 mg by mouth daily.   Melatonin 10 MG CAPS Take 10 mg by mouth at bedtime as needed (sleep).   Menaquinone-7 (VITAMIN K2 PO) Take 125 mg by mouth daily.   methylPREDNISolone (MEDROL DOSEPAK) 4 MG TBPK tablet As directed   nitroGLYCERIN (NITROSTAT) 0.4 MG SL tablet Place 1  tablet (0.4 mg total) under the tongue every 5 (five) minutes as needed for chest pain.   olmesartan-hydrochlorothiazide (BENICAR HCT) 20-12.5 MG tablet Take 1 tablet by mouth daily. Annual appt is due must see provider for future refills   Probiotic Product (ALIGN KIDS PROBIOTIC PO) Take by mouth.   sildenafil (VIAGRA) 25 MG tablet Take 25 mg by mouth daily as needed for erectile dysfunction.   traMADol (ULTRAM) 50 MG tablet Take 1-2 tablets (50-100 mg total) by mouth every 6 (six) hours as needed for moderate pain or severe pain.   zolpidem (AMBIEN) 10 MG tablet Take 1 tablet (10 mg total) by mouth at bedtime.     Allergies:   Levofloxacin, Omeprazole, and Rapaflo [silodosin]   Past Medical History:  Diagnosis Date   Allergic rhinitis    Arthritis    BPH (benign prostatic hyperplasia)    Cancer (HCC)    kidney    Cataract    bilateral    Cervical spinal stenosis    Dr Dohmier   Colon polyp    Diverticulosis of colon    ED (erectile dysfunction)    Elevated PSA    GERD (gastroesophageal reflux disease)      Hemorrhoids    HTN (hypertension)    Microcytosis     Past Surgical History:  Procedure Laterality Date   cataract surg     BIL   HEMORRHOID SURGERY     LAPAROSCOPIC NEPHRECTOMY Left 12/22/2019   Procedure: LAPAROSCOPIC RADICAL NEPHRECTOMY;  Surgeon: Borden, Lester, MD;  Location: [facility];  Service: Urology;  Laterality: Left;   TOENAIL EXCISION  2009   ALL   TONSILLECTOMY     VASECTOMY     WISDOM TOOTH EXTRACTION       Social History:  The patient  reports that he has never smoked. He has never used smokeless tobacco. He reports that he does not drink alcohol and does not use drugs.   Family History:  The patient's family history includes Alzheimer's disease in his brother and mother; Heart disease in his father; Hypertension in an other family member.    ROS:  Please see the history of present illness. All other systems are reviewed and  Negative to the above  problem except as noted.    PHYSICAL EXAM: VS:  BP 128/76   Pulse 76   Ht 5' 8" (1.727 m)   Wt 181 lb (82.1 kg)   SpO2 98%   BMI 27.52 kg/m   GEN: Well nourished, well developed, in no acute distress  HEENT: normal  Neck: no JVD, carotid bruits, or masses Cardiac: RRR; no murmurs, rubs, or gallops,no edema  Respiratory:  clear to auscultation bilaterally, normal work of breathing GI: soft, nontender, nondistended, + BS  No hepatomegaly  MS: no deformity Moving all extremities   Skin: warm and dry, no rash Neuro:  Strength and sensation are intact Psych: euthymic mood, full affect   EKG:  EKG is ordered today.  Sinus bradycardia  57   Occasional PACs   Possilbe septal MI   Calcium score CT  2021  Coronary calcium score of 180 Agatston units. This was 44th percentile for age and sex matched control, suggesting low to intermediate risk for future cardiac events.  Lipid Panel    Component Value Date/Time   CHOL 201 (H) 05/16/2022 1454   TRIG 147.0 05/16/2022 1454   HDL 40.20 05/16/2022 1454   CHOLHDL 5 05/16/2022 1454   VLDL 29.4 05/16/2022 1454   LDLCALC 131 (H) 05/16/2022 1454      Wt Readings from Last 3 Encounters:  07/07/22 181 lb (82.1 kg)  05/16/22 180 lb (81.6 kg)  11/09/21 180 lb 9.6 oz (81.9 kg)      ASSESSMENT AND PLAN:  1  Arm pain   Pt complains of arm pain with exertion    Erratic, does not occur all the time   Never at rest    Does have a hx of DJD but overall concerning   Denies CP With CAD noted I would recomm Lexiscan PET/CT to evaluate for ischemia Does note some DOE   May consider echo.  See what stress test shows first   2 CAD   Ca score in 2021 was 180   Modify risk factors  3  HL  last LDL 131  HDL 40   Start lipitor  Will check lipomed and CMET in 2 months   4  Renal  S/p nephrectomy for renal CA   Follow up labs this summer  Tentative follow up next winter, sooner based on test findings/clinical changes       Current medicines are  reviewed at length with the patient today.  The patient does   not have concerns regarding medicines.  Signed, Ahlana Slaydon, MD  07/07/2022 1:02 PM    [facility] [address], [place], [place]  [address] Phone: [phone]; Fax: [phone]    

## 2022-07-17 ENCOUNTER — Telehealth (HOSPITAL_COMMUNITY): Payer: Self-pay | Admitting: Emergency Medicine

## 2022-07-17 NOTE — Telephone Encounter (Signed)
Reaching out to patient to offer assistance regarding upcoming cardiac imaging study; pt verbalizes understanding of appt date/time, parking situation and where to check in, pre-test NPO status and medications ordered, and verified current allergies; name and call back number provided for further questions should they arise Kolbey Teichert RN Navigator Cardiac Imaging [facility] [phone] office [phone] cell 

## 2022-07-18 ENCOUNTER — Encounter (HOSPITAL_COMMUNITY)
Admission: RE | Admit: 2022-07-18 | Discharge: 2022-07-18 | Disposition: A | Discharge status: Home / Self Care | Payer: Medicare Other | Source: Ambulatory Visit | Attending: Internal Medicine | Admitting: Internal Medicine

## 2022-07-18 DIAGNOSIS — I10 Essential (primary) hypertension: Secondary | ICD-10-CM | POA: Insufficient documentation

## 2022-07-18 DIAGNOSIS — I2583 Coronary atherosclerosis due to lipid rich plaque: Secondary | ICD-10-CM | POA: Insufficient documentation

## 2022-07-18 DIAGNOSIS — I2510 Atherosclerotic heart disease of native coronary artery without angina pectoris: Secondary | ICD-10-CM | POA: Diagnosis not present

## 2022-07-18 DIAGNOSIS — M79602 Pain in left arm: Secondary | ICD-10-CM | POA: Diagnosis not present

## 2022-07-18 DIAGNOSIS — I700 Atherosclerosis of aorta: Secondary | ICD-10-CM | POA: Diagnosis not present

## 2022-07-18 LAB — NM PET CT CARDIAC PERFUSION MULTI W/ABSOLUTE BLOODFLOW
MBFR: 1.68
Nuc Rest EF: 52 %
Nuc Stress EF: 50 %
Rest MBF: 0.69 ml/g/min
ST Depression (mm): 0 mm
Stress MBF: 1.16 ml/g/min
TID: 1.27

## 2022-07-18 MED ORDER — REGADENOSON 0.4 MG/5ML IV SOLN
INTRAVENOUS | Status: AC
  Filled 2022-07-18: qty 5

## 2022-07-18 MED ORDER — DEXTROSE 5 % IV SOLN
INTRAVENOUS | Status: AC
  Filled 2022-07-18: qty 50

## 2022-07-18 MED ORDER — RUBIDIUM RB82 GENERATOR (RUBYFILL)
21.7000 | PACK | Freq: Once | INTRAVENOUS | Status: AC
  Administered 2022-07-18: 21.7 via INTRAVENOUS

## 2022-07-18 MED ORDER — REGADENOSON 0.4 MG/5ML IV SOLN
0.4000 mg | Freq: Once | INTRAVENOUS | Status: AC
  Administered 2022-07-18: 0.4 mg via INTRAVENOUS

## 2022-07-18 MED ORDER — CAFFEINE CITRATE BASE COMPONENT 10 MG/ML IV SOLN
INTRAVENOUS | Status: AC
  Filled 2022-07-18: qty 3

## 2022-07-18 NOTE — Progress Notes (Signed)
Tolerated exam well 

## 2022-07-18 NOTE — Progress Notes (Signed)
Pt had some pain in Left arm, states he has a bad shoulder.  But also that is one of the symptoms he was having in left arm pain

## 2022-07-19 ENCOUNTER — Telehealth: Payer: Self-pay

## 2022-07-19 DIAGNOSIS — I200 Unstable angina: Secondary | ICD-10-CM

## 2022-07-19 DIAGNOSIS — Z01810 Encounter for preprocedural cardiovascular examination: Secondary | ICD-10-CM

## 2022-07-19 MED ORDER — ASPIRIN 81 MG PO TBEC: 81.0000 mg | DELAYED_RELEASE_TABLET | Freq: Every day | ORAL | 3 refills | Status: DC

## 2022-07-19 NOTE — Telephone Encounter (Signed)
-----   Message from Pricilla Riffle, MD sent at 07/19/2022 12:43 PM EDT ----- Reviewed with patient  REcomm L heart cath to define anatomy I have also reviewed with patient's nephrologist (Dr Valentino Nose)   OK to proceed   Recomm hydrating 1 L NS prior and 1 L after  Minimize contrast   No LV gram Will need CBC and BMET prior I recomm he start taking  81 mg ecASA

## 2022-07-19 NOTE — Telephone Encounter (Signed)
Pt verbalized understanding of his cath instructions and asked to have it sched for 6/25 with labs 6/21.. I tried to explain the benefit of having it done sooner but he declined and says he cannot open up his schedule prior to this date but will let us know if anything changes and if he develops any worsening symptoms.

## 2022-07-21 ENCOUNTER — Telehealth: Payer: Self-pay | Admitting: Internal Medicine

## 2022-07-21 NOTE — Telephone Encounter (Signed)
Patient is asking that the message the nurse sent on mychart be resent. He states the message has disappear. Please advise

## 2022-07-24 ENCOUNTER — Telehealth: Payer: Self-pay | Admitting: *Deleted

## 2022-07-24 NOTE — Telephone Encounter (Addendum)
Cardiac Catheterization scheduled at [facility] for: Tuesday August 01, 2022 10:30 AM Arrival time [facility] Main Entrance A at: 5:30 AM-pre-procedure hydration  Nothing to eat after midnight prior to procedure, clear liquids until 5 AM day of procedure.  Medication instructions: -Hold:  Olmesartan-hydrochlorothiazide-day before and day of procedure-per protocol GFR 35 -Other usual morning medications can be taken with sips of water including aspirin 81 mg.  Confirmed patient has responsible adult to drive home post procedure and be with patient first 24 hours after arriving home.  Plan to go home the same day, you will only stay overnight if medically necessary.  Reviewed procedure instructions with patient.  07/26/22-Hgb 11.7-pt reports he has not noticed any blood in his stool or had any recent bleeding events, does note he has hemorrhoids.

## 2022-07-25 NOTE — Telephone Encounter (Signed)
Copied from 07/19/22 Staff Message: Hi! This is from Dr Tenny Craw:   "Reviewed with patient  REcomm L heart cath to define anatomy  I have also reviewed with patient's nephrologist (Dr Valentino Nose)   OK to proceed   Recomm hydrating 1 L NS prior and 1 L after  Minimize contrast   No LV gram  Will need CBC and BMET prior  I recomm he start taking  81 mg ecASA   Dr Tenny Craw plans to talk with Dr Lynnette Caffey.  He is on for 08/01/22 with labs 07/28/22... he will arrive at 5:30 am. Let me know if you need anything." ______ Reviewed with Dr Tenny Craw 07/25/22: -IV orders placed for 39ml/kg/hr for 8 hours -Based on pt's current recorded weight 82 kg-this will be 246 ml/hr for 8 hours-total over 8 hours-1968 ml (~1 L NS prior and 1 L NS after).

## 2022-07-26 ENCOUNTER — Ambulatory Visit: Payer: Medicare Other | Attending: Internal Medicine

## 2022-07-26 DIAGNOSIS — I200 Unstable angina: Secondary | ICD-10-CM

## 2022-07-26 DIAGNOSIS — Z01810 Encounter for preprocedural cardiovascular examination: Secondary | ICD-10-CM | POA: Diagnosis not present

## 2022-07-27 LAB — CBC
Hematocrit: 38.7 % (ref 37.5–51.0)
Hemoglobin: 11.7 g/dL — ABNORMAL LOW (ref 13.0–17.7)
MCH: 18.3 pg — ABNORMAL LOW (ref 26.6–33.0)
MCHC: 30.2 g/dL — ABNORMAL LOW (ref 31.5–35.7)
MCV: 60 fL — ABNORMAL LOW (ref 79–97)
Platelets: 158 10*3/uL (ref 150–450)
RBC: 6.41 x10E6/uL — ABNORMAL HIGH (ref 4.14–5.80)
RDW: 18.3 % — ABNORMAL HIGH (ref 11.6–15.4)
WBC: 5.9 10*3/uL (ref 3.4–10.8)

## 2022-07-27 LAB — BASIC METABOLIC PANEL
BUN/Creatinine Ratio: 12 (ref 10–24)
BUN: 23 mg/dL (ref 8–27)
CO2: 25 mmol/L (ref 20–29)
Calcium: 9.1 mg/dL (ref 8.6–10.2)
Chloride: 101 mmol/L (ref 96–106)
Creatinine, Ser: 1.93 mg/dL — ABNORMAL HIGH (ref 0.76–1.27)
Glucose: 98 mg/dL (ref 70–99)
Potassium: 4.3 mmol/L (ref 3.5–5.2)
Sodium: 140 mmol/L (ref 134–144)
eGFR: 35 mL/min/{1.73_m2} — ABNORMAL LOW (ref >59)

## 2022-07-28 ENCOUNTER — Ambulatory Visit: Payer: Medicare Other

## 2022-08-01 ENCOUNTER — Encounter (HOSPITAL_COMMUNITY)
Admission: RE | Disposition: A | Discharge status: Home / Self Care | Payer: Self-pay | Source: Home / Self Care | Attending: Internal Medicine

## 2022-08-01 ENCOUNTER — Other Ambulatory Visit (HOSPITAL_COMMUNITY): Payer: Self-pay

## 2022-08-01 ENCOUNTER — Other Ambulatory Visit: Payer: Self-pay

## 2022-08-01 ENCOUNTER — Ambulatory Visit (HOSPITAL_COMMUNITY)
Admission: RE | Admit: 2022-08-01 | Discharge: 2022-08-01 | Disposition: A | Discharge status: Home / Self Care | Payer: Medicare Other | Attending: Internal Medicine | Admitting: Internal Medicine

## 2022-08-01 DIAGNOSIS — N183 Chronic kidney disease, stage 3 unspecified: Secondary | ICD-10-CM | POA: Diagnosis not present

## 2022-08-01 DIAGNOSIS — Z85528 Personal history of other malignant neoplasm of kidney: Secondary | ICD-10-CM | POA: Diagnosis not present

## 2022-08-01 DIAGNOSIS — R0609 Other forms of dyspnea: Secondary | ICD-10-CM | POA: Diagnosis present

## 2022-08-01 DIAGNOSIS — K219 Gastro-esophageal reflux disease without esophagitis: Secondary | ICD-10-CM | POA: Insufficient documentation

## 2022-08-01 DIAGNOSIS — Z955 Presence of coronary angioplasty implant and graft: Secondary | ICD-10-CM | POA: Insufficient documentation

## 2022-08-01 DIAGNOSIS — I2510 Atherosclerotic heart disease of native coronary artery without angina pectoris: Secondary | ICD-10-CM | POA: Diagnosis present

## 2022-08-01 DIAGNOSIS — Z905 Acquired absence of kidney: Secondary | ICD-10-CM | POA: Insufficient documentation

## 2022-08-01 DIAGNOSIS — I129 Hypertensive chronic kidney disease with stage 1 through stage 4 chronic kidney disease, or unspecified chronic kidney disease: Secondary | ICD-10-CM | POA: Insufficient documentation

## 2022-08-01 DIAGNOSIS — I25110 Atherosclerotic heart disease of native coronary artery with unstable angina pectoris: Secondary | ICD-10-CM | POA: Diagnosis not present

## 2022-08-01 DIAGNOSIS — E785 Hyperlipidemia, unspecified: Secondary | ICD-10-CM | POA: Diagnosis not present

## 2022-08-01 DIAGNOSIS — I2582 Chronic total occlusion of coronary artery: Secondary | ICD-10-CM | POA: Diagnosis not present

## 2022-08-01 HISTORY — PX: LEFT HEART CATH AND CORONARY ANGIOGRAPHY: CATH118249

## 2022-08-01 HISTORY — PX: CORONARY ULTRASOUND/IVUS: CATH118244

## 2022-08-01 HISTORY — PX: CORONARY CTO INTERVENTION: CATH118236

## 2022-08-01 LAB — POCT ACTIVATED CLOTTING TIME
Activated Clotting Time: 348 seconds
Activated Clotting Time: 281 seconds

## 2022-08-01 SURGERY — LEFT HEART CATH AND CORONARY ANGIOGRAPHY
Anesthesia: LOCAL

## 2022-08-01 MED ORDER — CLOPIDOGREL BISULFATE 75 MG PO TABS: 75.0000 mg | ORAL_TABLET | Freq: Every day | ORAL | Status: DC

## 2022-08-01 MED ORDER — ONDANSETRON HCL 4 MG/2ML IJ SOLN: 4.0000 mg | Freq: Four times a day (QID) | INTRAMUSCULAR | Status: DC | PRN

## 2022-08-01 MED ORDER — ASPIRIN 81 MG PO CHEW: 81.0000 mg | CHEWABLE_TABLET | Freq: Every day | ORAL | Status: DC

## 2022-08-01 MED ORDER — CLOPIDOGREL BISULFATE 300 MG PO TABS
ORAL_TABLET | ORAL | Status: AC
  Filled 2022-08-01: qty 1

## 2022-08-01 MED ORDER — CLOPIDOGREL BISULFATE 75 MG PO TABS
75.0000 mg | ORAL_TABLET | Freq: Every day | ORAL | 6 refills | Status: DC
Start: 2022-08-01 — End: 2023-01-11

## 2022-08-01 MED ORDER — SODIUM CHLORIDE 0.9% FLUSH: 3.0000 mL | INTRAVENOUS | Status: DC | PRN

## 2022-08-01 MED ORDER — HYDRALAZINE HCL 20 MG/ML IJ SOLN: 10.0000 mg | INTRAMUSCULAR | Status: AC | PRN

## 2022-08-01 MED ORDER — HEPARIN SODIUM (PORCINE) 1000 UNIT/ML IJ SOLN
INTRAMUSCULAR | Status: AC
  Filled 2022-08-01: qty 10

## 2022-08-01 MED ORDER — ATORVASTATIN CALCIUM 40 MG PO TABS: 40.0000 mg | ORAL_TABLET | Freq: Every day | ORAL | 1 refills | Status: DC

## 2022-08-01 MED ORDER — LIDOCAINE HCL (PF) 1 % IJ SOLN
INTRAMUSCULAR | Status: AC
  Filled 2022-08-01: qty 30

## 2022-08-01 MED ORDER — MIDAZOLAM HCL 2 MG/2ML IJ SOLN
INTRAMUSCULAR | Status: DC | PRN
  Administered 2022-08-01: 1 mg via INTRAVENOUS

## 2022-08-01 MED ORDER — SODIUM CHLORIDE 0.9 % IV BOLUS
1000.0000 mL | Freq: Once | INTRAVENOUS | Status: AC
  Administered 2022-08-01: 1000 mL via INTRAVENOUS

## 2022-08-01 MED ORDER — CLOPIDOGREL BISULFATE 75 MG PO TABS
75.0000 mg | ORAL_TABLET | Freq: Every day | ORAL | 6 refills | Status: DC
Start: 2022-08-01 — End: 2022-08-01
  Filled 2022-08-01: qty 30, 30d supply, fill #0

## 2022-08-01 MED ORDER — MIDAZOLAM HCL 2 MG/2ML IJ SOLN
INTRAMUSCULAR | Status: AC
  Filled 2022-08-01: qty 2

## 2022-08-01 MED ORDER — SODIUM CHLORIDE 0.9 % IV SOLN: 250.0000 mL | INTRAVENOUS | Status: DC | PRN

## 2022-08-01 MED ORDER — ASPIRIN 81 MG PO TBEC
81.0000 mg | DELAYED_RELEASE_TABLET | Freq: Every day | ORAL | 0 refills | Status: DC
  Filled 2022-08-01: qty 30, 30d supply, fill #0

## 2022-08-01 MED ORDER — SODIUM CHLORIDE 0.9% FLUSH: 3.0000 mL | Freq: Two times a day (BID) | INTRAVENOUS | Status: DC

## 2022-08-01 MED ORDER — ASPIRIN 81 MG PO CHEW: 81.0000 mg | CHEWABLE_TABLET | ORAL | Status: DC

## 2022-08-01 MED ORDER — HEPARIN SODIUM (PORCINE) 1000 UNIT/ML IJ SOLN
INTRAMUSCULAR | Status: DC | PRN
  Administered 2022-08-01: 1000 [IU] via INTRAVENOUS
  Administered 2022-08-01: 4000 [IU] via INTRAVENOUS
  Administered 2022-08-01: 5000 [IU] via INTRAVENOUS

## 2022-08-01 MED ORDER — CLOPIDOGREL BISULFATE 75 MG PO TABS
75.0000 mg | ORAL_TABLET | Freq: Every day | ORAL | 6 refills | Status: DC
Start: 2022-08-01 — End: 2022-08-01

## 2022-08-01 MED ORDER — PANTOPRAZOLE SODIUM 40 MG PO TBEC
40.0000 mg | DELAYED_RELEASE_TABLET | Freq: Every day | ORAL | 1 refills | Status: DC
  Filled 2022-08-01: qty 30, 30d supply, fill #0

## 2022-08-01 MED ORDER — LABETALOL HCL 5 MG/ML IV SOLN: 10.0000 mg | INTRAVENOUS | Status: AC | PRN

## 2022-08-01 MED ORDER — SODIUM CHLORIDE 0.9 % IV SOLN: INTRAVENOUS | Status: AC

## 2022-08-01 MED ORDER — FENTANYL CITRATE (PF) 100 MCG/2ML IJ SOLN
INTRAMUSCULAR | Status: AC
  Filled 2022-08-01: qty 2

## 2022-08-01 MED ORDER — PANTOPRAZOLE SODIUM 40 MG PO TBEC: 40.0000 mg | DELAYED_RELEASE_TABLET | Freq: Every day | ORAL | 1 refills | Status: DC

## 2022-08-01 MED ORDER — LIDOCAINE HCL (PF) 1 % IJ SOLN
INTRAMUSCULAR | Status: DC | PRN
  Administered 2022-08-01: 2 mL

## 2022-08-01 MED ORDER — HEPARIN (PORCINE) IN NACL 1000-0.9 UT/500ML-% IV SOLN
INTRAVENOUS | Status: DC | PRN
  Administered 2022-08-01 (×2): 500 mL

## 2022-08-01 MED ORDER — NITROGLYCERIN 1 MG/10 ML FOR IR/CATH LAB
INTRA_ARTERIAL | Status: DC | PRN
  Administered 2022-08-01: 150 ug via INTRACORONARY

## 2022-08-01 MED ORDER — SODIUM CHLORIDE 0.9 % IV SOLN: INTRAVENOUS | Status: DC

## 2022-08-01 MED ORDER — ASPIRIN 81 MG PO TBEC: 81.0000 mg | DELAYED_RELEASE_TABLET | Freq: Every day | ORAL | 0 refills | Status: DC

## 2022-08-01 MED ORDER — CLOPIDOGREL BISULFATE 300 MG PO TABS
ORAL_TABLET | ORAL | Status: DC | PRN
  Administered 2022-08-01: 300 mg via ORAL

## 2022-08-01 MED ORDER — NITROGLYCERIN 1 MG/10 ML FOR IR/CATH LAB
INTRA_ARTERIAL | Status: AC
  Filled 2022-08-01: qty 10

## 2022-08-01 MED ORDER — VERAPAMIL HCL 2.5 MG/ML IV SOLN
INTRAVENOUS | Status: AC
  Filled 2022-08-01: qty 2

## 2022-08-01 MED ORDER — FENTANYL CITRATE (PF) 100 MCG/2ML IJ SOLN
INTRAMUSCULAR | Status: DC | PRN
  Administered 2022-08-01: 25 ug via INTRAVENOUS

## 2022-08-01 MED ORDER — ACETAMINOPHEN 325 MG PO TABS: 650.0000 mg | ORAL_TABLET | ORAL | Status: DC | PRN

## 2022-08-01 MED ORDER — VERAPAMIL HCL 2.5 MG/ML IV SOLN
INTRA_ARTERIAL | Status: DC | PRN
  Administered 2022-08-01: 5 mL via INTRA_ARTERIAL

## 2022-08-01 MED ORDER — IOHEXOL 350 MG/ML SOLN
INTRAVENOUS | Status: DC | PRN
  Administered 2022-08-01: 75 mL

## 2022-08-01 SURGICAL SUPPLY — 31 items
BALL SAPPHIRE NC24 3.25X15 (BALLOONS) ×1
BALLN SAPPHIRE 3.0X12 (BALLOONS) ×1
BALLN TREK OTW 2.5X12 (BALLOONS) ×1
BALLN WOLVERINE 3.00X10 (BALLOONS) ×1
BALLOON SAPPHIRE 3.0X12 (BALLOONS) IMPLANT
BALLOON SAPPHIRE NC24 3.25X15 (BALLOONS) IMPLANT
BALLOON TREK OTW 2.5X12 (BALLOONS) IMPLANT
BALLOON WOLVERINE 3.00X10 (BALLOONS) IMPLANT
CATH INFINITI 5FR ANG PIGTAIL (CATHETERS) IMPLANT
CATH LAUNCHER 6FR EBU3.5 (CATHETERS) IMPLANT
CATH OPTICROSS HD (CATHETERS) IMPLANT
CATH OPTITORQUE TIG 4.0 6F (CATHETERS) IMPLANT
DEVICE RAD COMP TR BAND LRG (VASCULAR PRODUCTS) IMPLANT
GLIDESHEATH SLEND SS 6F .021 (SHEATH) IMPLANT
KIT ENCORE 26 ADVANTAGE (KITS) IMPLANT
KIT HEART LEFT (KITS) ×1 IMPLANT
KIT HEMO VALVE WATCHDOG (MISCELLANEOUS) IMPLANT
PACK CARDIAC CATHETERIZATION (CUSTOM PROCEDURE TRAY) ×1 IMPLANT
SHEATH PROBE COVER 6X72 (BAG) IMPLANT
SLED PULL BACK IVUS (MISCELLANEOUS) IMPLANT
STENT SYNERGY XD 3.0X12 (Permanent Stent) IMPLANT
STENT SYNERGY XD 3.0X20 (Permanent Stent) IMPLANT
SYNERGY XD 3.0X12 (Permanent Stent) ×1 IMPLANT
SYNERGY XD 3.0X20 (Permanent Stent) ×1 IMPLANT
SYR MEDRAD MARK 7 150ML (SYRINGE) ×1 IMPLANT
TRANSDUCER W/STOPCOCK (MISCELLANEOUS) ×1 IMPLANT
TUBING CIL FLEX 10 FLL-RA (TUBING) ×1 IMPLANT
WIRE ASAHI PROWATER 180CM (WIRE) IMPLANT
WIRE ASAHI PROWATER 300CM (WIRE) IMPLANT
WIRE CHOICE GRAPHX PT 300 (WIRE) IMPLANT
WIRE EMERALD 3MM-J .035X260CM (WIRE) IMPLANT

## 2022-08-01 NOTE — Progress Notes (Signed)
CARDIAC REHAB PHASE I   Post stent eduction including site care, restrictions, risk factors, exercise guidelines, NTG use, heart healthy diet, antiplatelet therapy importance and CRP2 reviewed. All questions and concerns addressed. Will refer to [facility] for CRP2. Plan for home later today.   1400-1500  Woodroe Chen, RN BSN 08/01/2022 2:55 PM

## 2022-08-01 NOTE — Discharge Summary (Addendum)
Discharge Summary for Same Day PCI   Patient ID: Chad Palmer MRN: [id number]; DOB: September 15, 1945  Admit date: 08/01/2022 Discharge date: 08/01/2022  Primary Care Provider: Tresa Garter, MD  Primary Cardiologist: Dietrich Pates, MD  Primary Electrophysiologist:  None   Discharge Diagnoses    Principal Problem:   DOE (dyspnea on exertion) Active Problems:   CAD (coronary artery disease)   Diagnostic Studies/Procedures    Cardiac Catheterization 08/01/2022:    1st Diag lesion is 30% stenosed.   Mid LAD lesion is 100% stenosed.   A stent was successfully placed.   Post intervention, there is a 0% residual stenosis.   1.  Mid LAD chronic total occlusion treated with 2 overlapping drug-eluting stents with cutting balloon angioplasty and IVUS guidance. 2.  LVEDP of 26 mmHg.   Recommendation: 6 hours of IV fluids, same-day discharge with dual antiplatelet therapy consisting of aspirin and Plavix for 6 months then Plavix monotherapy indefinitely.  Diagnostic Dominance: Left  Intervention   _____________   History of Present Illness     Chad Palmer is a 77 y.o. male with past medical history of GERD, hypertension who was referred to cardiology for arm pain and noted coronary atherosclerosis.  He was seen by his PCP 05/2022 and complained of left arm pain with exertion particularly with walking up hills, occasionally with carrying items. It was recommended that he undergo outpatient cardiac PET.  This was completed on 6/11 which was abnormal with findings consistent with ischemia in the LAD territory.  It was recommended that he undergo cardiac catheterization for further evaluation.   Hospital Course     The patient underwent cardiac cath as noted above with 100% stenosed mid LAD lesion treated with PCI/DES x 2 using IVUS guidance.. Plan for DAPT with ASA/Plavix for at least 6 months, then Plavix as monotherapy indefinitely. The patient was seen by cardiac rehab while in  short stay. There were no observed complications post cath. Radial cath site was re-evaluated prior to discharge and found to be stable without any complications. Instructions/precautions regarding cath site care were given prior to discharge.  Karen Kitchens was seen by Dr. Lynnette Caffey and determined stable for discharge home. Follow up with our office has been arranged. Medications are listed below. Pertinent changes include addition of plavix, increased atorvastatin to 40mg  daily. _____________  Cath/PCI Registry Performance & Quality Measures: Aspirin prescribed? - Yes ADP Receptor Inhibitor (Plavix/Clopidogrel, Brilinta/Ticagrelor or Effient/Prasugrel) prescribed (includes medically managed patients)? - Yes High Intensity Statin (Lipitor 40-80mg  or Crestor 20-40mg ) prescribed? - Yes For EF <40%, was ACEI/ARB prescribed? - Not Applicable (EF >/= 40%) For EF <40%, Aldosterone Antagonist (Spironolactone or Eplerenone) prescribed? - Not Applicable (EF >/= 40%) Cardiac Rehab Phase II ordered (Included Medically managed Patients)? - Yes  _____________   Discharge Vitals Blood pressure 124/65, pulse 67, temperature 98.4 F (36.9 C), temperature source Temporal, resp. rate 18, height 5\' 8"  (1.727 m), weight 81.6 kg, SpO2 97 %.  Filed Weights   08/01/22 0553  Weight: 81.6 kg    Last Labs & Radiologic Studies    CBC No results for input(s): "WBC", "NEUTROABS", "HGB", "HCT", "MCV", "PLT" in the last 72 hours. Basic Metabolic Panel No results for input(s): "NA", "K", "CL", "CO2", "GLUCOSE", "BUN", "CREATININE", "CALCIUM", "MG", "PHOS" in the last 72 hours. Liver Function Tests No results for input(s): "AST", "ALT", "ALKPHOS", "BILITOT", "PROT", "ALBUMIN" in the last 72 hours. No results for input(s): "LIPASE", "AMYLASE" in the last 72 hours.  High Sensitivity Troponin:   No results for input(s): "TROPONINIHS" in the last 720 hours.  BNP Invalid input(s): "POCBNP" D-Dimer No results for  input(s): "DDIMER" in the last 72 hours. Hemoglobin A1C No results for input(s): "HGBA1C" in the last 72 hours. Fasting Lipid Panel No results for input(s): "CHOL", "HDL", "LDLCALC", "TRIG", "CHOLHDL", "LDLDIRECT" in the last 72 hours. Thyroid Function Tests No results for input(s): "TSH", "T4TOTAL", "T3FREE", "THYROIDAB" in the last 72 hours.  Invalid input(s): "FREET3" _____________  CARDIAC CATHETERIZATION  Addendum Date: 08/01/2022     1st Diag lesion is 30% stenosed.   Mid LAD lesion is 100% stenosed.   A stent was successfully placed.   Post intervention, there is a 0% residual stenosis. 1.  Mid LAD chronic total occlusion treated with 2 overlapping drug-eluting stents with cutting balloon angioplasty and IVUS guidance. 2.  LVEDP of 26 mmHg. Recommendation: 6 hours of IV fluids, same-day discharge with dual antiplatelet therapy consisting of aspirin and Plavix for 6 months then Plavix monotherapy indefinitely.  Result Date: 08/01/2022   1st Diag lesion is 30% stenosed.   Mid LAD lesion is 100% stenosed.   A stent was successfully placed.   Post intervention, there is a 0% residual stenosis. 1.  Mid LAD chronic total occlusion treated with 2 overlapping drug-eluting stents with IVUS guidance. 2.  LVEDP of 26 mmHg. Recommendation: 6 hours of IV fluids, same-day discharge with dual antiplatelet therapy consisting of aspirin and Plavix for 6 months then Plavix monotherapy indefinitely.   NM PET CT CARDIAC PERFUSION MULTI W/ABSOLUTE BLOODFLOW  Result Date: 07/18/2022   Findings are consistent with ischemia. The study is high risk due to LAD territory ischemia, the presence of TID, and the drop in EF with stress. Recommend coronary catheterization for further evaluation.   LV perfusion is abnormal. There is evidence of ischemia. Defect 1: There is a large defect with severe reduction in uptake present in the apical to mid anterior, anteroseptal, inferoseptal, septal and apex location(s) that is  reversible. There is abnormal wall motion in the defect area. Consistent with ischemia. The defect is consistent with abnormal perfusion in the LAD territory. Defect 2: There is a small defect present in the apical inferior location(s). There is abnormal wall motion in the defect area. Consistent with ischemia.   Rest left ventricular function is normal. Rest EF: 52 %. Stress left ventricular function is normal. Stress EF: 50 %. The EF dropped slightly with stress in the presence of significant ischemia. Recommend cath as detailed above.  End diastolic cavity size is normal. End systolic cavity size is normal.   Myocardial blood flow was computed to be 0.30ml/g/min at rest and 1.5ml/g/min at stress. Global myocardial blood flow reserve was 1.68 and was abnormal. Notably, the MBF was most reduced in the LAD territory which is consistent with the large perfusion defect as detailed above.   Coronary calcium was present on the attenuation correction CT images. Moderate coronary calcifications were present. Coronary calcifications were present in the left anterior descending artery and left circumflex artery distribution(s).   Electronically Signed: Laurance Flatten, MD CLINICAL DATA:  This over-read does not include interpretation of cardiac or coronary anatomy or pathology. The cardiac PET interpretation by the cardiologist is attached. COMPARISON:  Chest CT 11/04/2019. FINDINGS: Atherosclerotic calcifications in the thoracic aorta. Within the visualized portions of the thorax there are no suspicious appearing pulmonary nodules or masses, there is no acute consolidative airspace disease, no pleural effusions, no pneumothorax and no lymphadenopathy.  Visualized portions of the upper abdomen are unremarkable. There are no aggressive appearing lytic or blastic lesions noted in the visualized portions of the skeleton. IMPRESSION: 1.  Aortic Atherosclerosis (ICD10-I70.0). Electronically Signed   By: Trudie Reed M.D.    On: 07/18/2022 10:24   Disposition   Pt is being discharged home today in good condition.  Follow-up Plans & Appointments     Follow-up Information     Sharlene Dory, PA-C Follow up on 08/11/2022.   Specialty: Cardiology Why: at 8:50am for your follow up appt Contact information: [address] [place] [address] [phone]                Discharge Instructions     Amb Referral to Cardiac Rehabilitation   Complete by: As directed    Diagnosis: Coronary Stents   After initial evaluation and assessments completed: Virtual Based Care may be provided alone or in conjunction with Phase 2 Cardiac Rehab based on patient barriers.: Yes   Intensive Cardiac Rehabilitation (ICR) [facility] location only OR Traditional Cardiac Rehabilitation (TCR) *If criteria for ICR are not met will enroll in TCR [facility] only): Yes        Discharge Medications   Allergies as of 08/01/2022       Reactions   Levofloxacin Nausea Only   Omeprazole    Chest pain   Rapaflo [silodosin]    Dizzy        Medication List     STOP taking these medications    esomeprazole 40 MG capsule Commonly known as: NEXIUM       TAKE these medications    Align 4 MG Caps Take 4 mg by mouth at bedtime.   aspirin EC 81 MG tablet Take 1 tablet (81 mg total) by mouth daily. Swallow whole.   atorvastatin 40 MG tablet Commonly known as: LIPITOR Take 1 tablet (40 mg total) by mouth daily. What changed:  medication strength how much to take   AUSTRALIAN DREAM ARTHRITIS EX Apply 1 Application topically daily as needed (pain).   B-12 2500 MCG Tabs Take 2,500 mcg by mouth daily.   CALCIUM + D + K PO Take 1 tablet by mouth daily.   clopidogrel 75 MG tablet Commonly known as: Plavix Take 1 tablet (75 mg total) by mouth daily.   finasteride 5 MG tablet Commonly known as: PROSCAR Take 1 tablet (5 mg total) by mouth daily.   fluticasone 50 MCG/ACT nasal spray Commonly known as:  FLONASE Place 2 sprays into both nostrils daily. Annual appt is due must see provider for future refills   lidocaine 5 % Commonly known as: LIDODERM Place 1 patch onto the skin daily as needed (pain). Remove & Discard patch within 12 hours or as directed by MD   Magnesium 250 MG Tabs Take 250 mg by mouth daily.   nitroGLYCERIN 0.4 MG SL tablet Commonly known as: NITROSTAT Place 1 tablet (0.4 mg total) under the tongue every 5 (five) minutes as needed for chest pain.   olmesartan-hydrochlorothiazide 20-12.5 MG tablet Commonly known as: BENICAR HCT Take 1 tablet by mouth daily. Annual appt is due must see provider for future refills   pantoprazole 40 MG tablet Commonly known as: Protonix Take 1 tablet (40 mg total) by mouth daily.   sildenafil 100 MG tablet Commonly known as: VIAGRA Take 50 mg by mouth daily as needed for erectile dysfunction.   zolpidem 10 MG tablet Commonly known as: AMBIEN Take 1 tablet (  10 mg total) by mouth at bedtime.        Allergies Allergies  Allergen Reactions   Levofloxacin Nausea Only   Omeprazole     Chest pain   Rapaflo [Silodosin]     Dizzy     Outstanding Labs/Studies   FLP/LFTs in 8 weeks   Duration of Discharge Encounter   Greater than 30 minutes including physician time.  Signed, Laverda Page, NP 08/01/2022, 3:48 PM   ATTENDING ATTESTATION:  After conducting a review of all available clinical information with the care team, interviewing the patient, and performing a physical exam, I agree with the findings and plan described in this note.   GEN: No acute distress.   HEENT:  MMM, no JVD, no scleral icterus Cardiac: RRR, no murmurs, rubs, or gallops.  Respiratory: Clear to auscultation bilaterally. GI: Soft, nontender, non-distended  MS: No edema; No deformity. Neuro:  Nonfocal  Vasc:  right TR band in place  Patient is doing well after uncomplicated PCI of mid LAD chronic total occlusion with 2 overlapping  drug-eluting stents with Cutting Balloon angioplasty and IVUS.  Only 75 cc of contrast dye was used.  Discharged today after 6 hours of IV fluids with close hospital follow-up.  Alverda Skeans, MD Pager [phone]

## 2022-08-01 NOTE — Discharge Instructions (Signed)

## 2022-08-01 NOTE — Interval H&P Note (Signed)
History and Physical Interval Note:  08/01/2022 6:36 AM  Karen Kitchens  has presented today for surgery, with the diagnosis of unstable angina.  The various methods of treatment have been discussed with the patient and family. After consideration of risks, benefits and other options for treatment, the patient has consented to  Procedure(s): LEFT HEART CATH AND CORONARY ANGIOGRAPHY (N/A) as a surgical intervention.  The patient's history has been reviewed, patient examined, no change in status, stable for surgery.  I have reviewed the patient's chart and labs.  Questions were answered to the patient's satisfaction.    Cath Lab Visit (complete for each Cath Lab visit)  Clinical Evaluation Leading to the Procedure:   ACS: No.  Non-ACS:    Anginal Classification: CCS III  Anti-ischemic medical therapy: Minimal Therapy (1 class of medications)  Non-Invasive Test Results: High-risk stress test findings: cardiac mortality >3%/year  Prior CABG: No previous CABG        Orbie Pyo

## 2022-08-02 ENCOUNTER — Encounter (HOSPITAL_COMMUNITY): Payer: Self-pay | Admitting: Internal Medicine

## 2022-08-02 ENCOUNTER — Other Ambulatory Visit: Payer: Self-pay | Admitting: Cardiology

## 2022-08-02 DIAGNOSIS — N1831 Chronic kidney disease, stage 3a: Secondary | ICD-10-CM

## 2022-08-03 ENCOUNTER — Telehealth: Payer: Self-pay | Admitting: Internal Medicine

## 2022-08-03 NOTE — Telephone Encounter (Signed)
Patient states that left backside has turned bright red, itchy and hot to the touch since his recent procedure and would like a call back to discuss what he could possibly take. Please advise.

## 2022-08-03 NOTE — Telephone Encounter (Signed)
Called pt to get more information regarding concern. Reports buttocks are red, itchy and hot to touch.  Feels may be a reaction to something form recent heart cath.  Reports sat on buttocks for 13 hours with little movement.   Discomfort woke pt from sleep. Advised pt to try topical benadryl until MD and nurse f/u. Will send concern to be addressed.

## 2022-08-03 NOTE — Telephone Encounter (Signed)
Orbie Pyo, MD  You; Yates Decamp, Arvid Right, RN4 hours ago (10:06 AM)   AT May be a reaction to plavix.  I agree with your plan.  No need to switch to a different agent now  _________________________________________________________  Jeanene Erb and spoke w the patient.  He said the dark red area is a rash, and is spreading to the right side now as well.  No other areas on his body yet.  He is using a benadryl gel and cool compresses.  Wondering if he is allergic to the linen at hospital.  He said around 2am Wednesday am he woke up because the area was itching so badly.    I asked him to continue to monitor and call w update either tomorrow or Monday.  Also asked him to contact primary care provider for input as well.  Adv per Dr. Lynnette Caffey it may be a reaction to Plavix but he should continue to take it as directed without missing any doses for now.  He voices understanding.  Thanked me for call back.  Asked that I let Dr. Tenny Craw know as well.

## 2022-08-04 ENCOUNTER — Ambulatory Visit
Admission: RE | Admit: 2022-08-04 | Discharge: 2022-08-04 | Disposition: A | Discharge status: Home / Self Care | Payer: Medicare Other | Source: Ambulatory Visit | Attending: Family Medicine | Admitting: Family Medicine

## 2022-08-04 ENCOUNTER — Other Ambulatory Visit: Payer: Self-pay | Admitting: Family Medicine

## 2022-08-04 ENCOUNTER — Encounter: Payer: Self-pay | Admitting: Family Medicine

## 2022-08-04 ENCOUNTER — Ambulatory Visit: Payer: Medicare Other | Attending: Cardiovascular Disease

## 2022-08-04 ENCOUNTER — Ambulatory Visit (INDEPENDENT_AMBULATORY_CARE_PROVIDER_SITE_OTHER): Payer: Medicare Other | Admitting: Family Medicine

## 2022-08-04 VITALS — BP 142/72 | HR 100 | Temp 97.6°F | Ht 68.0 in | Wt 181.0 lb

## 2022-08-04 DIAGNOSIS — N1831 Chronic kidney disease, stage 3a: Secondary | ICD-10-CM | POA: Diagnosis not present

## 2022-08-04 DIAGNOSIS — L03314 Cellulitis of groin: Secondary | ICD-10-CM

## 2022-08-04 DIAGNOSIS — L03317 Cellulitis of buttock: Secondary | ICD-10-CM

## 2022-08-04 DIAGNOSIS — K5730 Diverticulosis of large intestine without perforation or abscess without bleeding: Secondary | ICD-10-CM | POA: Diagnosis not present

## 2022-08-04 DIAGNOSIS — K4090 Unilateral inguinal hernia, without obstruction or gangrene, not specified as recurrent: Secondary | ICD-10-CM | POA: Diagnosis not present

## 2022-08-04 DIAGNOSIS — N400 Enlarged prostate without lower urinary tract symptoms: Secondary | ICD-10-CM | POA: Diagnosis not present

## 2022-08-04 LAB — CBC WITH DIFFERENTIAL/PLATELET
Basophils Absolute: 0 10*3/uL (ref 0.0–0.1)
Basophils Relative: 0.1 % (ref 0.0–3.0)
Eosinophils Absolute: 0.2 10*3/uL (ref 0.0–0.7)
Eosinophils Relative: 2.6 % (ref 0.0–5.0)
HCT: 38.9 % — ABNORMAL LOW (ref 39.0–52.0)
Hemoglobin: 12.1 g/dL — ABNORMAL LOW (ref 13.0–17.0)
Lymphocytes Relative: 8.3 % — ABNORMAL LOW (ref 12.0–46.0)
Lymphs Abs: 0.7 10*3/uL (ref 0.7–4.0)
MCHC: 31 g/dL (ref 30.0–36.0)
MCV: 59.1 fl — ABNORMAL LOW (ref 78.0–100.0)
Monocytes Absolute: 0.7 10*3/uL (ref 0.1–1.0)
Monocytes Relative: 7.6 % (ref 3.0–12.0)
Neutro Abs: 7.3 10*3/uL (ref 1.4–7.7)
Neutrophils Relative %: 81.4 % — ABNORMAL HIGH (ref 43.0–77.0)
Platelets: 147 10*3/uL — ABNORMAL LOW (ref 150.0–400.0)
RBC: 6.58 Mil/uL — ABNORMAL HIGH (ref 4.22–5.81)
RDW: 16.8 % — ABNORMAL HIGH (ref 11.5–15.5)
WBC: 8.9 10*3/uL (ref 4.0–10.5)

## 2022-08-04 LAB — C-REACTIVE PROTEIN: CRP: 1.4 mg/dL (ref 0.5–20.0)

## 2022-08-04 MED ORDER — AMOXICILLIN-POT CLAVULANATE 500-125 MG PO TABS
1.0000 | ORAL_TABLET | Freq: Two times a day (BID) | ORAL | 0 refills | Status: DC
Start: 2022-08-04 — End: 2022-08-07

## 2022-08-04 MED ORDER — SULFAMETHOXAZOLE-TRIMETHOPRIM 400-80 MG PO TABS
1.0000 | ORAL_TABLET | Freq: Two times a day (BID) | ORAL | 0 refills | Status: DC
Start: 2022-08-04 — End: 2022-09-25

## 2022-08-04 NOTE — Patient Instructions (Signed)
Please go downstairs for labs.   I have ordered a STAT pelvic CT to be done today.   Start the oral antibiotics as prescribed. Drink plenty of water.   If you develop fever, chills, nausea, vomiting or worsening pain, you should go to the emergency department.   We will be in touch with your results and the next steps.

## 2022-08-04 NOTE — Progress Notes (Signed)
Fortunately, the CT did not show an abscess.  He can go ahead and start the antibiotics I prescribed.  The CT did show some chronic conditions that he should follow-up with Dr. Posey Rea about including diverticulosis, aortic atherosclerosis and moderate prostate enlargement.  These may or may not be known to him already.  Nothing concerning at this time.  Please schedule a follow-up appointment with Dr. Yetta Barre or Dr. Posey Rea Monday morning for the cellulitis.

## 2022-08-04 NOTE — Progress Notes (Signed)
Subjective:     Patient ID: Chad Palmer, male    DOB: 1945-08-15, 77 y.o.   MRN: [id number]  Chief Complaint  Patient presents with   Rash    Started on left glute then spread to part of right side radiating to groin, itching and has burning feeling to it. Every once in a while depending on how he is sitting, pain. Started Wednesday morning around 2 am, had heart surgery on Tuesday. Bright red    Rash Pertinent negatives include no diarrhea, fever, shortness of breath or vomiting.    Discussed the use of AI scribe software for clinical note transcription with the patient, who gave verbal consent to proceed.  History of Present Illness          Here for a 2 day hx of cellulitis, spreading from left buttock to right and left groin.  Recent heart cath with stents. He is on Plavix.    There are no preventive care reminders to display for this patient.  Past Medical History:  Diagnosis Date   Allergic rhinitis    Arthritis    BPH (benign prostatic hyperplasia)    Cancer (HCC)    kidney    Cataract    bilateral    Cervical spinal stenosis    Dr Dohmier   Colon polyp    Diverticulosis of colon    ED (erectile dysfunction)    Elevated PSA    GERD (gastroesophageal reflux disease)    Hemorrhoids    HTN (hypertension)    Microcytosis     Past Surgical History:  Procedure Laterality Date   cataract surg     BIL   CORONARY CTO INTERVENTION N/A 08/01/2022   Procedure: CORONARY CTO INTERVENTION;  Surgeon: Orbie Pyo, MD;  Location: [facility];  Service: Cardiovascular;  Laterality: N/A;   CORONARY ULTRASOUND/IVUS N/A 08/01/2022   Procedure: Coronary Ultrasound/IVUS;  Surgeon: Orbie Pyo, MD;  Location: [facility];  Service: Cardiovascular;  Laterality: N/A;   HEMORRHOID SURGERY     LAPAROSCOPIC NEPHRECTOMY Left 12/22/2019   Procedure: LAPAROSCOPIC RADICAL NEPHRECTOMY;  Surgeon: Heloise Purpura, MD;  Location: [facility];  Service: Urology;   Laterality: Left;   LEFT HEART CATH AND CORONARY ANGIOGRAPHY N/A 08/01/2022   Procedure: LEFT HEART CATH AND CORONARY ANGIOGRAPHY;  Surgeon: Orbie Pyo, MD;  Location: [facility];  Service: Cardiovascular;  Laterality: N/A;   TOENAIL EXCISION  2009   ALL   TONSILLECTOMY     VASECTOMY     WISDOM TOOTH EXTRACTION      Family History  Problem Relation Age of Onset   Alzheimer's disease Mother    Heart disease Father    Alzheimer's disease Brother    Hypertension Other    Colon cancer Neg Hx    Esophageal cancer Neg Hx    Stomach cancer Neg Hx    Rectal cancer Neg Hx     Social History   Socioeconomic History   Marital status: Married    Spouse name: Not on file   Number of children: 2   Years of education: Not on file   Highest education level: Bachelor's degree (e.g., BA, AB, BS)  Occupational History   Occupation: Publisher/ retired    Comment: Racing cars  Tobacco Use   Smoking status: Never   Smokeless tobacco: Never  Vaping Use   Vaping Use: Never used  Substance and Sexual Activity   Alcohol use: No  Alcohol/week: 0.0 standard drinks of alcohol   Drug use: No   Sexual activity: Yes  Other Topics Concern   Not on file  Social History Narrative      Social Determinants of Health   Financial Resource Strain: Low Risk  (05/12/2022)   Overall Financial Resource Strain (CARDIA)    Difficulty of Paying Living Expenses: Not hard at all  Food Insecurity: No Food Insecurity (05/12/2022)   Hunger Vital Sign    Worried About Running Out of Food in the Last Year: Never true    Ran Out of Food in the Last Year: Never true  Transportation Needs: No Transportation Needs (05/12/2022)   PRAPARE - Administrator, Civil Service (Medical): No    Lack of Transportation (Non-Medical): No  Physical Activity: Insufficiently Active (05/12/2022)   Exercise Vital Sign    Days of Exercise per Week: 3 days    Minutes of Exercise per Session: 20 min  Stress:  Stress Concern Present (05/12/2022)   [employer] of Occupational Health - Occupational Stress Questionnaire    Feeling of Stress : To some extent  Social Connections: Unknown (05/12/2022)   Social Connection and Isolation Panel [NHANES]    Frequency of Communication with Friends and Family: Three times a week    Frequency of Social Gatherings with Friends and Family: Once a week    Attends Religious Services: Patient declined    Database administrator or Organizations: No    Attends Engineer, structural: 1 to 4 times per year    Marital Status: Married  Catering manager Violence: Not At Risk (11/09/2021)   Humiliation, Afraid, Rape, and Kick questionnaire    Fear of Current or Ex-Partner: No    Emotionally Abused: No    Physically Abused: No    Sexually Abused: No    Outpatient Medications Prior to Visit  Medication Sig Dispense Refill   aspirin EC 81 MG tablet Take 1 tablet (81 mg total) by mouth daily. Swallow whole. 30 tablet 0   atorvastatin (LIPITOR) 40 MG tablet Take 1 tablet (40 mg total) by mouth daily. 90 tablet 1   Calcium-Vitamin D-Vitamin K (CALCIUM + D + K PO) Take 1 tablet by mouth daily.     clopidogrel (PLAVIX) 75 MG tablet Take 1 tablet (75 mg total) by mouth daily. 30 tablet 6   Cyanocobalamin (B-12) 2500 MCG TABS Take 2,500 mcg by mouth daily.     finasteride (PROSCAR) 5 MG tablet Take 1 tablet (5 mg total) by mouth daily. 90 tablet 3   fluticasone (FLONASE) 50 MCG/ACT nasal spray Place 2 sprays into both nostrils daily. Annual appt is due must see provider for future refills 16 mL 0   Histamine Dihydrochloride (AUSTRALIAN DREAM ARTHRITIS EX) Apply 1 Application topically daily as needed (pain).     lidocaine (LIDODERM) 5 % Place 1 patch onto the skin daily as needed (pain). Remove & Discard patch within 12 hours or as directed by MD     Magnesium 250 MG TABS Take 250 mg by mouth daily.     nitroGLYCERIN (NITROSTAT) 0.4 MG SL tablet Place 1 tablet (0.4 mg  total) under the tongue every 5 (five) minutes as needed for chest pain. 20 tablet 3   olmesartan-hydrochlorothiazide (BENICAR HCT) 20-12.5 MG tablet Take 1 tablet by mouth daily. Annual appt is due must see provider for future refills 90 tablet 3   Probiotic Product (ALIGN) 4 MG CAPS Take 4 mg by mouth  at bedtime.     sildenafil (VIAGRA) 100 MG tablet Take 50 mg by mouth daily as needed for erectile dysfunction.     zolpidem (AMBIEN) 10 MG tablet Take 1 tablet (10 mg total) by mouth at bedtime. 90 tablet 1   pantoprazole (PROTONIX) 40 MG tablet Take 1 tablet (40 mg total) by mouth daily. (Patient not taking: Reported on 08/04/2022) 30 tablet 1   No facility-administered medications prior to visit.    Allergies  Allergen Reactions   Levofloxacin Nausea Only   Omeprazole     Chest pain   Rapaflo [Silodosin]     Dizzy     Review of Systems  Constitutional:  Negative for chills, fever and malaise/fatigue.  Respiratory:  Negative for shortness of breath.   Cardiovascular:  Negative for chest pain and palpitations.  Gastrointestinal:  Negative for abdominal pain, constipation, diarrhea, nausea and vomiting.  Genitourinary:  Negative for dysuria, frequency and urgency.  Skin:  Positive for itching and rash.  Neurological:  Negative for dizziness, focal weakness and headaches.       Objective:    Physical Exam Constitutional:      General: He is not in acute distress.    Appearance: He is not ill-appearing.  Eyes:     Extraocular Movements: Extraocular movements intact.     Conjunctiva/sclera: Conjunctivae normal.  Cardiovascular:     Rate and Rhythm: Normal rate.  Pulmonary:     Effort: Pulmonary effort is normal.     Breath sounds: Normal breath sounds.  Abdominal:     General: There is no distension.  Musculoskeletal:     Cervical back: Normal range of motion and neck supple.  Skin:    General: Skin is warm and dry.     Findings: Rash present.     Comments: Large  raised, hot, indurated areas to left buttock, right buttock and left groin consistent with cellulitis. Pustules present. No areas of fluctuance.  Neurological:     General: No focal deficit present.     Mental Status: He is alert and oriented to person, place, and time.     Cranial Nerves: No cranial nerve deficit.     Motor: No weakness.     Coordination: Coordination normal.  Psychiatric:        Mood and Affect: Mood normal.        Behavior: Behavior normal.        Thought Content: Thought content normal.      BP (!) 142/72 (BP Location: Left Arm, Patient Position: Sitting, Cuff Size: Large)   Pulse 100   Temp 97.6 F (36.4 C) (Temporal)   Ht 5\' 8"  (1.727 m)   Wt 181 lb (82.1 kg)   SpO2 99%   BMI 27.52 kg/m  Wt Readings from Last 3 Encounters:  08/04/22 181 lb (82.1 kg)  08/01/22 180 lb (81.6 kg)  07/07/22 181 lb (82.1 kg)        Assessment & Plan:   Problem List Items Addressed This Visit   None Visit Diagnoses     Cellulitis of buttock    -  Primary   Relevant Medications   amoxicillin-clavulanate (AUGMENTIN) 500-125 MG tablet   sulfamethoxazole-trimethoprim (BACTRIM) 400-80 MG tablet   Other Relevant Orders   CT PELVIS WO CONTRAST   CBC with Differential/Platelet   C-reactive protein   Cellulitis of left groin       Relevant Medications   amoxicillin-clavulanate (AUGMENTIN) 500-125 MG tablet   sulfamethoxazole-trimethoprim (BACTRIM) 400-80 MG  tablet   Other Relevant Orders   CT PELVIS WO CONTRAST   CBC with Differential/Platelet   C-reactive protein      Estimated Creatinine Clearance: 33 mL/min (A) (by C-G formula based on SCr of 1.93 mg/dL (H)). He is well appearing. Afebrile.  STAT CT pelvis ordered.  CBC, CRP ordered.  He will hold off on picking up oral antibiotics that are renal dosed until CT results are back. He is aware that if he has an abscess that he will need to go to the ED.    I am having Donato Heinz. [place] "Ed" start on  amoxicillin-clavulanate and sulfamethoxazole-trimethoprim. I am also having him maintain his B-12, Magnesium, olmesartan-hydrochlorothiazide, finasteride, fluticasone, zolpidem, nitroGLYCERIN, Calcium-Vitamin D-Vitamin K (CALCIUM + D + K PO), sildenafil, Align, Histamine Dihydrochloride (AUSTRALIAN DREAM ARTHRITIS EX), lidocaine, aspirin EC, pantoprazole, clopidogrel, and atorvastatin.  Meds ordered this encounter  Medications   amoxicillin-clavulanate (AUGMENTIN) 500-125 MG tablet    Sig: Take 1 tablet by mouth 2 (two) times daily.    Dispense:  14 tablet    Refill:  0    Order Specific Question:   Supervising Provider    Answer:   Hillard Danker A [[id number]]   sulfamethoxazole-trimethoprim (BACTRIM) 400-80 MG tablet    Sig: Take 1 tablet by mouth 2 (two) times daily.    Dispense:  14 tablet    Refill:  0    Order Specific Question:   Supervising Provider    Answer:   Hillard Danker A [[id number]]

## 2022-08-05 ENCOUNTER — Encounter: Payer: Self-pay | Admitting: Internal Medicine

## 2022-08-05 LAB — BASIC METABOLIC PANEL
BUN/Creatinine Ratio: 9 — ABNORMAL LOW (ref 10–24)
BUN: 18 mg/dL (ref 8–27)
CO2: 25 mmol/L (ref 20–29)
Calcium: 9.3 mg/dL (ref 8.6–10.2)
Chloride: 99 mmol/L (ref 96–106)
Creatinine, Ser: 1.91 mg/dL — ABNORMAL HIGH (ref 0.76–1.27)
Glucose: 86 mg/dL (ref 70–99)
Potassium: 4.1 mmol/L (ref 3.5–5.2)
Sodium: 139 mmol/L (ref 134–144)
eGFR: 35 mL/min/{1.73_m2} — ABNORMAL LOW (ref >59)

## 2022-08-07 ENCOUNTER — Ambulatory Visit (INDEPENDENT_AMBULATORY_CARE_PROVIDER_SITE_OTHER): Payer: Medicare Other | Admitting: Internal Medicine

## 2022-08-07 ENCOUNTER — Encounter: Payer: Self-pay | Admitting: Internal Medicine

## 2022-08-07 ENCOUNTER — Telehealth (HOSPITAL_COMMUNITY): Payer: Self-pay

## 2022-08-07 VITALS — BP 128/70 | HR 82 | Temp 97.6°F | Resp 16 | Ht 68.0 in | Wt 184.0 lb

## 2022-08-07 DIAGNOSIS — D696 Thrombocytopenia, unspecified: Secondary | ICD-10-CM | POA: Diagnosis not present

## 2022-08-07 DIAGNOSIS — R109 Unspecified abdominal pain: Secondary | ICD-10-CM | POA: Diagnosis not present

## 2022-08-07 DIAGNOSIS — L270 Generalized skin eruption due to drugs and medicaments taken internally: Secondary | ICD-10-CM | POA: Diagnosis not present

## 2022-08-07 DIAGNOSIS — D520 Dietary folate deficiency anemia: Secondary | ICD-10-CM

## 2022-08-07 LAB — CBC WITH DIFFERENTIAL/PLATELET
Basophils Absolute: 0 10*3/uL (ref 0.0–0.1)
Basophils Relative: 0.3 % (ref 0.0–3.0)
Eosinophils Absolute: 0.2 10*3/uL (ref 0.0–0.7)
Eosinophils Relative: 2.1 % (ref 0.0–5.0)
HCT: 38 % — ABNORMAL LOW (ref 39.0–52.0)
Hemoglobin: 11.9 g/dL — ABNORMAL LOW (ref 13.0–17.0)
Lymphocytes Relative: 7.9 % — ABNORMAL LOW (ref 12.0–46.0)
Lymphs Abs: 0.8 10*3/uL (ref 0.7–4.0)
MCHC: 31.4 g/dL (ref 30.0–36.0)
MCV: 58.6 fl — ABNORMAL LOW (ref 78.0–100.0)
Monocytes Absolute: 0.7 10*3/uL (ref 0.1–1.0)
Monocytes Relative: 6.5 % (ref 3.0–12.0)
Neutro Abs: 8.4 10*3/uL — ABNORMAL HIGH (ref 1.4–7.7)
Neutrophils Relative %: 83.2 % — ABNORMAL HIGH (ref 43.0–77.0)
Platelets: 163 10*3/uL (ref 150.0–400.0)
RBC: 6.48 Mil/uL — ABNORMAL HIGH (ref 4.22–5.81)
RDW: 16.7 % — ABNORMAL HIGH (ref 11.5–15.5)
WBC: 10.1 10*3/uL (ref 4.0–10.5)

## 2022-08-07 LAB — URINALYSIS, ROUTINE W REFLEX MICROSCOPIC
Bilirubin Urine: NEGATIVE
Ketones, ur: NEGATIVE
Leukocytes,Ua: NEGATIVE
Nitrite: NEGATIVE
Specific Gravity, Urine: <=1.005 — AB (ref 1.000–1.030)
Total Protein, Urine: NEGATIVE
Urine Glucose: NEGATIVE
Urobilinogen, UA: 0.2 (ref 0.0–1.0)
WBC, UA: NONE SEEN (ref 0–?)
pH: 6 (ref 5.0–8.0)

## 2022-08-07 LAB — VITAMIN B12: Vitamin B-12: 796 pg/mL (ref 211–911)

## 2022-08-07 LAB — FOLATE: Folate: 5.9 ng/mL — ABNORMAL LOW (ref >5.9)

## 2022-08-07 LAB — C-REACTIVE PROTEIN: CRP: 1.5 mg/dL (ref 0.5–20.0)

## 2022-08-07 MED ORDER — METHYLPREDNISOLONE ACETATE 80 MG/ML IJ SUSP
80.0000 mg | Freq: Once | INTRAMUSCULAR | Status: AC
Start: 2022-08-07 — End: 2022-08-07
  Administered 2022-08-07: 80 mg via INTRAMUSCULAR

## 2022-08-07 MED ORDER — HYDROXYZINE HCL 10 MG PO TABS
10.0000 mg | ORAL_TABLET | Freq: Three times a day (TID) | ORAL | 0 refills | Status: AC | PRN
Start: 2022-08-07 — End: 2022-08-17

## 2022-08-07 MED ORDER — FOLIC ACID 1 MG PO TABS
1.0000 mg | ORAL_TABLET | Freq: Every day | ORAL | 1 refills | Status: DC
Start: 2022-08-07 — End: 2023-01-11

## 2022-08-07 NOTE — Progress Notes (Signed)
Subjective:  Patient ID: Chad Palmer, male    DOB: 1945/05/15  Age: 77 y.o. MRN: [id number]  CC: Rash, Hypertension, and Anemia   HPI Chad Palmer presents for f/up -  He complains of a 2 day history or diffuse erythematous, pruritic rash. He has been taking augmentin and bactrim for left buttocks cellulitis.  Outpatient Medications Prior to Visit  Medication Sig Dispense Refill   aspirin EC 81 MG tablet Take 1 tablet (81 mg total) by mouth daily. Swallow whole. 30 tablet 0   atorvastatin (LIPITOR) 40 MG tablet Take 1 tablet (40 mg total) by mouth daily. 90 tablet 1   Calcium-Vitamin D-Vitamin K (CALCIUM + D + K PO) Take 1 tablet by mouth daily.     clopidogrel (PLAVIX) 75 MG tablet Take 1 tablet (75 mg total) by mouth daily. 30 tablet 6   Cyanocobalamin (B-12) 2500 MCG TABS Take 2,500 mcg by mouth daily.     finasteride (PROSCAR) 5 MG tablet Take 1 tablet (5 mg total) by mouth daily. 90 tablet 3   fluticasone (FLONASE) 50 MCG/ACT nasal spray Place 2 sprays into both nostrils daily. Annual appt is due must see provider for future refills 16 mL 0   Histamine Dihydrochloride (AUSTRALIAN DREAM ARTHRITIS EX) Apply 1 Application topically daily as needed (pain).     lidocaine (LIDODERM) 5 % Place 1 patch onto the skin daily as needed (pain). Remove & Discard patch within 12 hours or as directed by MD     Magnesium 250 MG TABS Take 250 mg by mouth daily.     nitroGLYCERIN (NITROSTAT) 0.4 MG SL tablet Place 1 tablet (0.4 mg total) under the tongue every 5 (five) minutes as needed for chest pain. 20 tablet 3   pantoprazole (PROTONIX) 40 MG tablet Take 1 tablet (40 mg total) by mouth daily. 30 tablet 1   Probiotic Product (ALIGN) 4 MG CAPS Take 4 mg by mouth at bedtime.     sildenafil (VIAGRA) 100 MG tablet Take 50 mg by mouth daily as needed for erectile dysfunction.     sulfamethoxazole-trimethoprim (BACTRIM) 400-80 MG tablet Take 1 tablet by mouth 2 (two) times daily. 14  tablet 0   zolpidem (AMBIEN) 10 MG tablet Take 1 tablet (10 mg total) by mouth at bedtime. 90 tablet 1   amoxicillin-clavulanate (AUGMENTIN) 500-125 MG tablet Take 1 tablet by mouth 2 (two) times daily. 14 tablet 0   olmesartan-hydrochlorothiazide (BENICAR HCT) 20-12.5 MG tablet Take 1 tablet by mouth daily. Annual appt is due must see provider for future refills 90 tablet 3   No facility-administered medications prior to visit.    ROS Review of Systems  Constitutional: Negative.  Negative for chills, diaphoresis, fatigue and fever.  HENT: Negative.  Negative for facial swelling and trouble swallowing.   Eyes: Negative.   Respiratory: Negative.  Negative for cough, chest tightness, shortness of breath and wheezing.   Cardiovascular:  Negative for chest pain, palpitations and leg swelling.  Gastrointestinal:  Negative for abdominal pain, diarrhea and nausea.  Endocrine: Negative.   Genitourinary:  Positive for flank pain. Negative for difficulty urinating, dysuria and hematuria.  Musculoskeletal:  Negative for arthralgias, myalgias and neck pain.  Skin:  Positive for color change and rash.  Neurological: Negative.  Negative for dizziness and weakness.  Hematological:  Negative for adenopathy. Does not bruise/bleed easily.  Psychiatric/Behavioral: Negative.  Negative for dysphoric mood.     Objective:  BP 128/70 (BP Location: Left Arm, Patient Position: Sitting, Cuff Size: Large)   Pulse 82   Temp 97.6 F (36.4 C) (Oral)   Resp 16   Ht 5\' 8"  (1.727 m)   Wt 184 lb (83.5 kg)   SpO2 97%   BMI 27.98 kg/m   BP Readings from Last 3 Encounters:  08/11/22 130/78  08/11/22 130/70  08/07/22 128/70    Wt Readings from Last 3 Encounters:  08/11/22 181 lb (82.1 kg)  08/11/22 181 lb 9.6 oz (82.4 kg)  08/07/22 184 lb (83.5 kg)    Physical Exam Vitals reviewed. Exam conducted with a chaperone present Malva Limes).  Constitutional:      General: He is not in acute distress.     Appearance: Normal appearance. He is ill-appearing. He is not toxic-appearing or diaphoretic.  HENT:     Nose: Nose normal.     Mouth/Throat:     Mouth: Mucous membranes are moist.  Eyes:     General: No scleral icterus.    Conjunctiva/sclera: Conjunctivae normal.  Cardiovascular:     Rate and Rhythm: Normal rate and regular rhythm.     Heart sounds: No murmur heard. Pulmonary:     Effort: Pulmonary effort is normal.     Breath sounds: No stridor. No wheezing, rhonchi or rales.  Abdominal:     General: Abdomen is flat.     Palpations: There is no mass.     Tenderness: There is no abdominal tenderness. There is no guarding.     Hernia: No hernia is present.  Musculoskeletal:        General: Normal range of motion.     Cervical back: Neck supple.     Right lower leg: No edema.     Left lower leg: No edema.  Lymphadenopathy:     Cervical: No cervical adenopathy.  Skin:    General: Skin is warm and dry.     Findings: Erythema and rash present.     Comments: There is a diffuse erythematous maculopapular rash.  There is no involvement of the palms, soles, or mucous membranes.  There are no target, vesicles, pustules, or excoriations.  Neurological:     General: No focal deficit present.     Mental Status: He is alert. Mental status is at baseline.  Psychiatric:        Mood and Affect: Mood normal.        Behavior: Behavior normal.     Lab Results  Component Value Date   WBC 10.1 08/07/2022   HGB 11.9 (L) 08/07/2022   HCT 38.0 (L) 08/07/2022   PLT 163.0 Repeated and verified X2. 08/07/2022   GLUCOSE 86 08/04/2022   CHOL 201 (H) 05/16/2022   TRIG 147.0 05/16/2022   HDL 40.20 05/16/2022   LDLCALC 131 (H) 05/16/2022   ALT 14 05/16/2022   AST 17 05/16/2022   NA 139 08/04/2022   K 4.1 08/04/2022   CL 99 08/04/2022   CREATININE 1.91 (H) 08/04/2022   BUN 18 08/04/2022   CO2 25 08/04/2022   TSH 1.06 05/16/2022   PSA 10.49 (H) 06/27/2017   HGBA1C 5.9 02/19/2020    CT  PELVIS WO CONTRAST  Result Date: 08/04/2022 CLINICAL DATA:  Possible perianal abscess or fistula. EXAM: CT PELVIS WITHOUT CONTRAST TECHNIQUE: Multidetector CT imaging of the pelvis was performed following the standard protocol without intravenous contrast. RADIATION DOSE REDUCTION: This exam was performed according to the departmental dose-optimization program which includes automated exposure control,  adjustment of the mA and/or kV according to patient size and/or use of iterative reconstruction technique. COMPARISON:  Jun 21, 2022. FINDINGS: Urinary Tract: Visualized ureters and urinary bladder are unremarkable. Bowel: Sigmoid diverticulosis without inflammation. No evidence of bowel obstruction. No definite perianal abscess is noted. Vascular/Lymphatic: Aortic atherosclerosis.  No adenopathy is noted. Reproductive:  Moderate prostatic enlargement is noted. Other: Small fat containing left inguinal hernia. No ascites is noted. Musculoskeletal: No acute abnormality is noted. IMPRESSION: Sigmoid diverticulosis without inflammation. Moderate prostatic enlargement. Small fat containing left inguinal hernia. No acute abnormality seen in the pelvis. Aortic Atherosclerosis (ICD10-I70.0). Electronically Signed   By: Lupita Raider M.D.   On: 08/04/2022 14:43    Assessment & Plan:   Allergic drug rash- Will treat with a systemic steroid and antihistamine.  Will discontinue both antibiotics. -     methylPREDNISolone Acetate -     C-reactive protein; Future -     hydrOXYzine HCl; Take 1 tablet (10 mg total) by mouth 3 (three) times daily as needed for up to 10 days for itching.  Dispense: 30 tablet; Refill: 0  Thrombocytopenia (HCC)- Will evaluate for B12 and folate deficiencies. -     Vitamin B12; Future -     CBC with Differential/Platelet; Future -     Folate; Future -     CBC with Differential/Platelet; Future  Acute left flank pain- His UA is normal. -     Urinalysis, Routine w reflex microscopic;  Future  Dietary folate deficiency anemia -     Folic Acid; Take 1 tablet (1 mg total) by mouth daily.  Dispense: 90 tablet; Refill: 1     Follow-up: Return in about 5 days (around 08/12/2022).  Sanda Linger, MD

## 2022-08-07 NOTE — Telephone Encounter (Signed)
Called pt to see if he was interested in Cardiac Rehab. Left VM.

## 2022-08-07 NOTE — Patient Instructions (Signed)
Drug Rash  A drug rash occurs when a medicine causes a change in the color or texture of the skin. It can develop minutes, hours, or days after you take the medicine. The rash may appear on a small area of skin or all over your body. What are the causes? This condition may be caused by one of these three conditions: An allergic reaction to the medicine. An unwanted side effect of a certain medicine. Extreme sensitivity to sunlight caused by the medicine. What increases the risk? If you take any of these medicines that make your skin sensitive to light and are exposed to sunlight, it can make you more likely to develop this condition: Antibiotics, including tetracyclines and sulfa medicines. Antifungals. Antihistamines. Diuretics. Retinoids, such as isotretinoin. Statins. NSAIDs. What are the signs or symptoms? Symptoms of this condition include: Redness. Tiny bumps. Peeling. Itching. Itchy welts (hives). Swelling. How is this diagnosed? This condition may be diagnosed based on: A physical exam. Tests to find out which medicine caused the rash. These tests may include: Skin tests. Blood tests. How is this treated? This condition is treated with medicines, including: Antihistamine. This may be given to relieve itching. NSAIDs. These may be given to reduce swelling and to treat pain. A steroid medicine. This may be given to reduce swelling. The rash usually goes away when you stop taking the medicine that caused it. Follow these instructions at home: Take over-the-counter and prescription medicines only as told by your health care provider. Tell all your health care providers about any medicine reactions that you have had in the past. If your rash was caused by sensitivity to sunlight, and while your rash is healing: Avoid being in the sun if possible, especially when it is strongest, usually between 10 a.m. and 4 p.m. Cover your skin with pants, long sleeves, and a hat when  you are exposed to sunlight. If you have hives: Take a cool shower or use a cool compress to relieve itchiness. Take over-the-counter antihistamines, as recommended by your health care provider, until the hives are gone. Hives are not contagious. Keep all follow-up visits. This is important. Contact a health care provider if: You have fever. Your rash is not going away. Your rash gets worse. Your rash comes back. You have high-pitched whistling sounds when you breathe, most often when you breathe out (wheezing) or coughing. Get help right away if: You start to have breathing problems. You start to have shortness of breath. Your face or throat starts to swell. You have severe weakness with dizziness or fainting. You have chest pain. Your skin starts to blister and peel. These symptoms may represent a serious problem that is an emergency. Do not wait to see if the symptoms will go away. Get medical help right away. Call your local emergency services (911 in the [place] Do not drive yourself to the hospital. Summary A drug rash occurs when a medicine causes a change in the color or texture of the skin. The rash may appear on a small area of skin or all over your body. It can develop minutes, hours, or days after you take the medicine. Your health care provider will do various tests to determine what medicine caused your rash. The rash may be treated with medicine to relieve itching, swelling, and pain. This information is not intended to replace advice given to you by your health care provider. Make sure you discuss any questions you have with your health care provider. Document Revised: 07/05/2020  Document Reviewed: 07/05/2020 Elsevier Patient Education  2024 [employer].

## 2022-08-10 ENCOUNTER — Other Ambulatory Visit: Payer: Self-pay | Admitting: Internal Medicine

## 2022-08-10 NOTE — Progress Notes (Signed)
Cardiology Office Note:  .   Date:  08/11/2022  ID:  Chad Palmer, DOB [birth year], MRN [id number] PCP: Tresa Garter, MD  [facility] Providers Cardiologist:  Dietrich Pates, MD {  History of Present Illness: .   Chad Palmer is a 77 y.o. male with a past medical history of DOE, CAD, GERD, hypertension who was referred to cardiology for arm pain and noted coronary atherosclerosis.  Patient was seen by PCP 05/2022 and complained of left arm pain with exertion particularly walking up hills, occasionally when carrying items.  It was recommended that he undergo outpatient cardiac PET.  This was completed 07/18/2022 which was abnormal with findings assessment with ischemia in the LAD territory.  It was recommended that he undergo cardiac catheterization for further evaluation.  Cardiac catheterization showed 100% stenosis of mid LAD lesion treated with PCI/DES x 2 using IVUS guidance.  Plan for DAPT with ASA/Plavix for at least 6 months, then Plavix as monotherapy indefinitely.  Patient was seen by cardiac rehab on short stay.  No observed complications post cath.  Radial cath site was evaluated and stable at discharge.  Today, he tells me that everything went well with his cardiac stent however he had a terrible allergic reaction to one of the antibiotics he was taking for a staph infection.  Broke out in a body wide rash.  Now is taking hydroxyzine.  He tells me its gotten a little bit better.  When he was in the hospital he had a pelvic CT scan and routine labs drawn.  Recently started on a statin.  We have plan to recheck his lipid panel on August 20.  Otherwise, recovering well.  Reports no shortness of breath nor dyspnea on exertion. Reports no chest pain, pressure, or tightness. No edema, orthopnea, PND. Reports no palpitations.    ROS: Pertinent ROS in HPI  Studies Reviewed: .        Cardiac Catheterization 08/01/2022:     1st Diag lesion is 30% stenosed.   Mid LAD lesion is  100% stenosed.   A stent was successfully placed.   Post intervention, there is a 0% residual stenosis.   1.  Mid LAD chronic total occlusion treated with 2 overlapping drug-eluting stents with cutting balloon angioplasty and IVUS guidance. 2.  LVEDP of 26 mmHg.   Recommendation: 6 hours of IV fluids, same-day discharge with dual antiplatelet therapy consisting of aspirin and Plavix for 6 months then Plavix monotherapy indefinitely.   Diagnostic Dominance: Left  Intervention    _____________           Physical Exam:   VS:  BP 130/70   Pulse 78   Ht 5\' 8"  (1.727 m)   Wt 181 lb 9.6 oz (82.4 kg)   SpO2 98%   BMI 27.61 kg/m    Wt Readings from Last 3 Encounters:  08/11/22 181 lb (82.1 kg)  08/11/22 181 lb 9.6 oz (82.4 kg)  08/07/22 184 lb (83.5 kg)    GEN: Well nourished, well developed in no acute distress NECK: No JVD; No carotid bruits CARDIAC: RRR, no murmurs, rubs, gallops RESPIRATORY:  Clear to auscultation without rales, wheezing or rhonchi  ABDOMEN: Soft, non-tender, non-distended EXTREMITIES:  No edema; No deformity, right radial cath site is healing well  ASSESSMENT AND PLAN: .   1.  CAD status post PCI -cardiac rehab order placed today -Continue current medications which include aspirin 81 mg daily, Lipitor 40 mg daily, Plavix 75 mg daily,  magnesium 250 mg daily, nitro as needed, olmesartan/HCTZ 20-12.5 mg daily -He knows he will be on Plavix/aspirin x 6 months  2.  Hypertension -blood pressure is well controlled -Continue current medications -Continue to monitor blood pressure at home -Continue low-sodium, heart healthy diet  3. DOE -some issues but likely due to exercise intolerance -Plan for cardiac rehab to gain some strength -Encourage daily walking for 5 to 10 minutes 3 times a day     Cardiac Rehabilitation Eligibility Assessment  The patient is ready to start cardiac rehabilitation from a cardiac standpoint.      Dispo: Plan for follow-up  in 3 months with Dr. Tenny Craw or APP  Signed, Sharlene Dory, PA-C

## 2022-08-11 ENCOUNTER — Encounter: Payer: Self-pay | Admitting: Internal Medicine

## 2022-08-11 ENCOUNTER — Ambulatory Visit: Payer: Medicare Other | Attending: Physician Assistant | Admitting: Physician Assistant

## 2022-08-11 ENCOUNTER — Ambulatory Visit (INDEPENDENT_AMBULATORY_CARE_PROVIDER_SITE_OTHER): Payer: Medicare Other | Admitting: Internal Medicine

## 2022-08-11 ENCOUNTER — Encounter: Payer: Self-pay | Admitting: Physician Assistant

## 2022-08-11 VITALS — BP 130/78 | HR 55 | Temp 98.0°F | Resp 16 | Ht 68.0 in | Wt 181.0 lb

## 2022-08-11 VITALS — BP 130/70 | HR 78 | Ht 68.0 in | Wt 181.6 lb

## 2022-08-11 DIAGNOSIS — D520 Dietary folate deficiency anemia: Secondary | ICD-10-CM

## 2022-08-11 DIAGNOSIS — I10 Essential (primary) hypertension: Secondary | ICD-10-CM | POA: Diagnosis not present

## 2022-08-11 DIAGNOSIS — K219 Gastro-esophageal reflux disease without esophagitis: Secondary | ICD-10-CM | POA: Insufficient documentation

## 2022-08-11 DIAGNOSIS — R0600 Dyspnea, unspecified: Secondary | ICD-10-CM | POA: Diagnosis not present

## 2022-08-11 DIAGNOSIS — D696 Thrombocytopenia, unspecified: Secondary | ICD-10-CM

## 2022-08-11 DIAGNOSIS — L270 Generalized skin eruption due to drugs and medicaments taken internally: Secondary | ICD-10-CM | POA: Diagnosis not present

## 2022-08-11 DIAGNOSIS — I2510 Atherosclerotic heart disease of native coronary artery without angina pectoris: Secondary | ICD-10-CM | POA: Diagnosis not present

## 2022-08-11 DIAGNOSIS — Z955 Presence of coronary angioplasty implant and graft: Secondary | ICD-10-CM | POA: Diagnosis not present

## 2022-08-11 NOTE — Patient Instructions (Addendum)
Medication Instructions:  Your physician recommends that you continue on your current medications as directed. Please refer to the Current Medication list given to you today.  *If you need a refill on your cardiac medications before your next appointment, please call your pharmacy*  Lab Work: Fasting lipids and lft's in 8 weeks If you have labs (blood work) drawn today and your tests are completely normal, you will receive your results only by: MyChart Message (if you have MyChart) OR A paper copy in the mail If you have any lab test that is abnormal or we need to change your treatment, we will call you to review the results.  Follow-Up: At [name], you and your health needs are our priority.  As part of our continuing mission to provide you with exceptional heart care, we have created designated Provider Care Teams.  These Care Teams include your primary Cardiologist (physician) and Advanced Practice Providers (APPs -  Physician Assistants and Nurse Practitioners) who all work together to provide you with the care you need, when you need it.  Your next appointment:   3 month(s)  Provider:   Jari Favre, PA-C then, Chad Pates, MD will plan to see you again in 6 month(s).   Low-Sodium Eating Plan Salt (sodium) helps you keep a healthy balance of fluids in your body. Too much sodium can raise your blood pressure. It can also cause fluid and waste to be held in your body. Your health care provider or dietitian may recommend a low-sodium eating plan if you have high blood pressure (hypertension), kidney disease, liver disease, or heart failure. Eating less sodium can help lower your blood pressure and reduce swelling. It can also protect your heart, liver, and kidneys. What are tips for following this plan? Reading food labels  Check food labels for the amount of sodium per serving. If you eat more than one serving, you must multiply the listed amount by the number of  servings. Choose foods with less than 140 milligrams (mg) of sodium per serving. Avoid foods with 300 mg of sodium or more per serving. Always check how much sodium is in a product, even if the label says "unsalted" or "no salt added." Shopping  Buy products labeled as "low-sodium" or "no salt added." Buy fresh foods. Avoid canned foods and pre-made or frozen meals. Avoid canned, cured, or processed meats. Buy breads that have less than 80 mg of sodium per slice. Cooking  Eat more home-cooked food. Try to eat less restaurant, buffet, and fast food. Try not to add salt when you cook. Use salt-free seasonings or herbs instead of table salt or sea salt. Check with your provider or pharmacist before using salt substitutes. Cook with plant-based oils, such as canola, sunflower, or olive oil. Meal planning When eating at a restaurant, ask if your food can be made with less salt or no salt. Avoid dishes labeled as brined, pickled, cured, or smoked. Avoid dishes made with soy sauce, miso, or teriyaki sauce. Avoid foods that have monosodium glutamate (MSG) in them. MSG may be added to some restaurant food, sauces, soups, bouillon, and canned foods. Make meals that can be grilled, baked, poached, roasted, or steamed. These are often made with less sodium. General information Try to limit your sodium intake to 1,500-2,300 mg each day, or the amount told by your provider. What foods should I eat? Fruits Fresh, frozen, or canned fruit. Fruit juice. Vegetables Fresh or frozen vegetables. "No salt added" canned vegetables. "No  salt added" tomato sauce and paste. Low-sodium or reduced-sodium tomato and vegetable juice. Grains Low-sodium cereals, such as oats, puffed wheat and rice, and shredded wheat. Low-sodium crackers. Unsalted rice. Unsalted pasta. Low-sodium bread. Whole grain breads and whole grain pasta. Meats and other proteins Fresh or frozen meat, poultry, seafood, and fish. These should have  no added salt. Low-sodium canned tuna and salmon. Unsalted nuts. Dried peas, beans, and lentils without added salt. Unsalted canned beans. Eggs. Unsalted nut butters. Dairy Milk. Soy milk. Cheese that is naturally low in sodium, such as ricotta cheese, fresh mozzarella, or Swiss cheese. Low-sodium or reduced-sodium cheese. Cream cheese. Yogurt. Seasonings and condiments Fresh and dried herbs and spices. Salt-free seasonings. Low-sodium mustard and ketchup. Sodium-free salad dressing. Sodium-free light mayonnaise. Fresh or refrigerated horseradish. Lemon juice. Vinegar. Other foods Homemade, reduced-sodium, or low-sodium soups. Unsalted popcorn and pretzels. Low-salt or salt-free chips. The items listed above may not be all the foods and drinks you can have. Talk to a dietitian to learn more. What foods should I avoid? Vegetables Sauerkraut, pickled vegetables, and relishes. Olives. [name]. Onion rings. Regular canned vegetables, except low-sodium or reduced-sodium items. Regular canned tomato sauce and paste. Regular tomato and vegetable juice. Frozen vegetables in sauces. Grains Instant hot cereals. Bread stuffing, pancake, and biscuit mixes. Croutons. Seasoned rice or pasta mixes. Noodle soup cups. Boxed or frozen macaroni and cheese. Regular salted crackers. Self-rising flour. Meats and other proteins Meat or fish that is salted, canned, smoked, spiced, or pickled. Precooked or cured meat, such as sausages or meat loaves. Chad Palmer. Ham. Pepperoni. Hot dogs. Corned beef. Chipped beef. Salt pork. Jerky. Pickled herring, anchovies, and sardines. Regular canned tuna. Salted nuts. Dairy Processed cheese and cheese spreads. Hard cheeses. Cheese curds. Blue cheese. Feta cheese. String cheese. Regular cottage cheese. Buttermilk. Canned milk. Fats and oils Salted butter. Regular margarine. Ghee. Bacon fat. Seasonings and condiments Onion salt, garlic salt, seasoned salt, table salt, and sea salt.  Canned and packaged gravies. Worcestershire sauce. Tartar sauce. Barbecue sauce. Teriyaki sauce. Soy sauce, including reduced-sodium soy sauce. Steak sauce. Fish sauce. Oyster sauce. Cocktail sauce. Horseradish that you find on the shelf. Regular ketchup and mustard. Meat flavorings and tenderizers. Bouillon cubes. Hot sauce. Pre-made or packaged marinades. Pre-made or packaged taco seasonings. Relishes. Regular salad dressings. Salsa. Other foods Salted popcorn and pretzels. Corn chips and puffs. Potato and tortilla chips. Canned or dried soups. Pizza. Frozen entrees and pot pies. The items listed above may not be all the foods and drinks you should avoid. Talk to a dietitian to learn more. This information is not intended to replace advice given to you by your health care provider. Make sure you discuss any questions you have with your health care provider. Document Revised: 02/09/2022 Document Reviewed: 02/09/2022 Elsevier Patient Education  2024 [employer]  Heart-Healthy Eating Plan Many factors influence your heart health, including eating and exercise habits. Heart health is also called coronary health. Coronary risk increases with abnormal blood fat (lipid) levels. A heart-healthy eating plan includes limiting unhealthy fats, increasing healthy fats, limiting salt (sodium) intake, and making other diet and lifestyle changes. What is my plan? Your health care provider may recommend that: You limit your fat intake to _________% or less of your total calories each day. You limit your saturated fat intake to _________% or less of your total calories each day. You limit the amount of cholesterol in your diet to less than _________ mg per day. You limit the amount of  sodium in your diet to less than _________ mg per day. What are tips for following this plan? Cooking Cook foods using methods other than frying. Baking, boiling, grilling, and broiling are all good options. Other ways to reduce  fat include: Removing the skin from poultry. Removing all visible fats from meats. Steaming vegetables in water or broth. Meal planning  At meals, imagine dividing your plate into fourths: Fill one-half of your plate with vegetables and green salads. Fill one-fourth of your plate with whole grains. Fill one-fourth of your plate with lean protein foods. Eat 2-4 cups of vegetables per day. One cup of vegetables equals 1 cup (91 g) broccoli or cauliflower florets, 2 medium carrots, 1 large bell pepper, 1 large sweet potato, 1 large tomato, 1 medium white potato, 2 cups (150 g) raw leafy greens. Eat 1-2 cups of fruit per day. One cup of fruit equals 1 small apple, 1 large banana, 1 cup (237 g) mixed fruit, 1 large orange,  cup (82 g) dried fruit, 1 cup (240 mL) 100% fruit juice. Eat more foods that contain soluble fiber. Examples include apples, broccoli, carrots, beans, peas, and barley. Aim to get 25-30 g of fiber per day. Increase your consumption of legumes, nuts, and seeds to 4-5 servings per week. One serving of dried beans or legumes equals  cup (90 g) cooked, 1 serving of nuts is  oz (12 almonds, 24 pistachios, or 7 walnut halves), and 1 serving of seeds equals  oz (8 g). Fats Choose healthy fats more often. Choose monounsaturated and polyunsaturated fats, such as olive and canola oils, avocado oil, flaxseeds, walnuts, almonds, and seeds. Eat more omega-3 fats. Choose salmon, mackerel, sardines, tuna, flaxseed oil, and ground flaxseeds. Aim to eat fish at least 2 times each week. Check food labels carefully to identify foods with trans fats or high amounts of saturated fat. Limit saturated fats. These are found in animal products, such as meats, butter, and cream. Plant sources of saturated fats include palm oil, palm kernel oil, and coconut oil. Avoid foods with partially hydrogenated oils in them. These contain trans fats. Examples are stick margarine, some tub margarines, cookies,  crackers, and other baked goods. Avoid fried foods. General information Eat more home-cooked food and less restaurant, buffet, and fast food. Limit or avoid alcohol. Limit foods that are high in added sugar and simple starches such as foods made using white refined flour (white breads, pastries, sweets). Lose weight if you are overweight. Losing just 5-10% of your body weight can help your overall health and prevent diseases such as diabetes and heart disease. Monitor your sodium intake, especially if you have high blood pressure. Talk with your health care provider about your sodium intake. Try to incorporate more vegetarian meals weekly. What foods should I eat? Fruits All fresh, canned (in natural juice), or frozen fruits. Vegetables Fresh or frozen vegetables (raw, steamed, roasted, or grilled). Green salads. Grains Most grains. Choose whole wheat and whole grains most of the time. Rice and pasta, including brown rice and pastas made with whole wheat. Meats and other proteins Lean, well-trimmed beef, veal, pork, and lamb. Chicken and [place] without skin. All fish and shellfish. Wild duck, rabbit, pheasant, and venison. Egg whites or low-cholesterol egg substitutes. Dried beans, peas, lentils, and tofu. Seeds and most nuts. Dairy Low-fat or nonfat cheeses, including ricotta and mozzarella. Skim or 1% milk (liquid, powdered, or evaporated). Buttermilk made with low-fat milk. Nonfat or low-fat yogurt. Fats and oils Non-hydrogenated (trans-free)  margarines. Vegetable oils, including soybean, sesame, sunflower, olive, avocado, peanut, safflower, corn, canola, and cottonseed. Salad dressings or mayonnaise made with a vegetable oil. Beverages Water (mineral or sparkling). Coffee and tea. Unsweetened ice tea. Diet beverages. Sweets and desserts Sherbet, gelatin, and fruit ice. Small amounts of dark chocolate. Limit all sweets and desserts. Seasonings and condiments All seasonings and  condiments. The items listed above may not be a complete list of foods and beverages you can eat. Contact a dietitian for more options. What foods should I avoid? Fruits Canned fruit in heavy syrup. Fruit in cream or butter sauce. Fried fruit. Limit coconut. Vegetables Vegetables cooked in cheese, cream, or butter sauce. Fried vegetables. Grains Breads made with saturated or trans fats, oils, or whole milk. Croissants. Sweet rolls. Donuts. High-fat crackers, such as cheese crackers and chips. Meats and other proteins Fatty meats, such as hot dogs, ribs, sausage, bacon, rib-eye roast or steak. High-fat deli meats, such as salami and bologna. Caviar. Domestic duck and goose. Organ meats, such as liver. Dairy Cream, sour cream, cream cheese, and creamed cottage cheese. Whole-milk cheeses. Whole or 2% milk (liquid, evaporated, or condensed). Whole buttermilk. Cream sauce or high-fat cheese sauce. Whole-milk yogurt. Fats and oils Meat fat, or shortening. Cocoa butter, hydrogenated oils, palm oil, coconut oil, palm kernel oil. Solid fats and shortenings, including bacon fat, salt pork, lard, and butter. Nondairy cream substitutes. Salad dressings with cheese or sour cream. Beverages Regular sodas and any drinks with added sugar. Sweets and desserts Frosting. Pudding. Cookies. Cakes. Pies. Milk chocolate or white chocolate. Buttered syrups. Full-fat ice cream or ice cream drinks. The items listed above may not be a complete list of foods and beverages to avoid. Contact a dietitian for more information. Summary Heart-healthy meal planning includes limiting unhealthy fats, increasing healthy fats, limiting salt (sodium) intake and making other diet and lifestyle changes. Lose weight if you are overweight. Losing just 5-10% of your body weight can help your overall health and prevent diseases such as diabetes and heart disease. Focus on eating a balance of foods, including fruits and vegetables, low-fat  or nonfat dairy, lean protein, nuts and legumes, whole grains, and heart-healthy oils and fats. This information is not intended to replace advice given to you by your health care provider. Make sure you discuss any questions you have with your health care provider. Document Revised: 02/28/2021 Document Reviewed: 02/28/2021 Elsevier Patient Education  2024 [employer].

## 2022-08-11 NOTE — Progress Notes (Signed)
Subjective:  Patient ID: Chad Palmer, male    DOB: 11/11/1945  Age: 77 y.o. MRN: [id number]  CC: Rash   HPI Chad Palmer presents for f/up ----  The rash, redness, and swelling today have resolved.  He still has mild itching at night and is getting symptom relief with the antihistamine.  He has had a few episodes of dizziness but he denies lightheadedness, chest pain, shortness of breath, or wheezing.      Outpatient Medications Prior to Visit  Medication Sig Dispense Refill   aspirin EC 81 MG tablet Take 1 tablet (81 mg total) by mouth daily. Swallow whole. 30 tablet 0   atorvastatin (LIPITOR) 40 MG tablet Take 1 tablet (40 mg total) by mouth daily. 90 tablet 1   Calcium-Vitamin D-Vitamin K (CALCIUM + D + K PO) Take 1 tablet by mouth daily.     clopidogrel (PLAVIX) 75 MG tablet Take 1 tablet (75 mg total) by mouth daily. 30 tablet 6   Cyanocobalamin (B-12) 2500 MCG TABS Take 2,500 mcg by mouth daily.     finasteride (PROSCAR) 5 MG tablet Take 1 tablet (5 mg total) by mouth daily. 90 tablet 3   fluticasone (FLONASE) 50 MCG/ACT nasal spray Place 2 sprays into both nostrils daily. Annual appt is due must see provider for future refills 16 mL 0   folic acid (FOLVITE) 1 MG tablet Take 1 tablet (1 mg total) by mouth daily. 90 tablet 1   Histamine Dihydrochloride (AUSTRALIAN DREAM ARTHRITIS EX) Apply 1 Application topically daily as needed (pain).     hydrOXYzine (ATARAX) 10 MG tablet Take 1 tablet (10 mg total) by mouth 3 (three) times daily as needed for up to 10 days for itching. 30 tablet 0   lidocaine (LIDODERM) 5 % Place 1 patch onto the skin daily as needed (pain). Remove & Discard patch within 12 hours or as directed by MD     Magnesium 250 MG TABS Take 250 mg by mouth daily.     nitroGLYCERIN (NITROSTAT) 0.4 MG SL tablet Place 1 tablet (0.4 mg total) under the tongue every 5 (five) minutes as needed for chest pain. 20 tablet 3   olmesartan-hydrochlorothiazide (BENICAR HCT)  20-12.5 MG tablet Take 1 tablet by mouth daily.     pantoprazole (PROTONIX) 40 MG tablet Take 1 tablet (40 mg total) by mouth daily. 30 tablet 1   Probiotic Product (ALIGN) 4 MG CAPS Take 4 mg by mouth at bedtime.     sildenafil (VIAGRA) 100 MG tablet Take 50 mg by mouth daily as needed for erectile dysfunction.     sulfamethoxazole-trimethoprim (BACTRIM) 400-80 MG tablet Take 1 tablet by mouth 2 (two) times daily. 14 tablet 0   zolpidem (AMBIEN) 10 MG tablet Take 1 tablet (10 mg total) by mouth at bedtime. 90 tablet 1   No facility-administered medications prior to visit.    ROS Review of Systems  Constitutional: Negative.  Negative for diaphoresis, fatigue and unexpected weight change.  HENT: Negative.    Eyes: Negative.   Respiratory:  Negative for cough, chest tightness, shortness of breath and wheezing.   Cardiovascular:  Negative for chest pain, palpitations and leg swelling.  Gastrointestinal:  Negative for abdominal pain, diarrhea and nausea.  Endocrine: Negative.   Genitourinary: Negative.  Negative for difficulty urinating.  Musculoskeletal: Negative.  Negative for myalgias.  Skin:  Negative for color change and rash.  Neurological:  Negative for dizziness, weakness and headaches.  Hematological:  Negative for  adenopathy. Does not bruise/bleed easily.  Psychiatric/Behavioral:  Negative for behavioral problems.     Objective:  BP 130/78 (BP Location: Right Arm, Patient Position: Sitting, Cuff Size: Normal)   Pulse (!) 55   Temp 98 F (36.7 C) (Oral)   Resp 16   Ht 5\' 8"  (1.727 m)   Wt 181 lb (82.1 kg)   SpO2 98%   BMI 27.52 kg/m   BP Readings from Last 3 Encounters:  08/11/22 130/78  08/11/22 130/70  08/07/22 128/70    Wt Readings from Last 3 Encounters:  08/11/22 181 lb (82.1 kg)  08/11/22 181 lb 9.6 oz (82.4 kg)  08/07/22 184 lb (83.5 kg)    Physical Exam Vitals reviewed.  Constitutional:      General: He is not in acute distress.    Appearance: He is  not ill-appearing, toxic-appearing or diaphoretic.  HENT:     Nose: Nose normal.     Mouth/Throat:     Mouth: Mucous membranes are moist.  Eyes:     General: No scleral icterus.    Conjunctiva/sclera: Conjunctivae normal.  Cardiovascular:     Rate and Rhythm: Normal rate.     Heart sounds: No murmur heard. Pulmonary:     Effort: Pulmonary effort is normal.     Breath sounds: No stridor. No wheezing, rhonchi or rales.  Abdominal:     General: Abdomen is flat.     Palpations: There is no mass.     Tenderness: There is no abdominal tenderness. There is no guarding.     Hernia: No hernia is present.  Musculoskeletal:        General: No swelling. Normal range of motion.     Cervical back: Neck supple.  Lymphadenopathy:     Cervical: No cervical adenopathy.  Skin:    General: Skin is warm and dry.     Findings: No erythema or rash.  Neurological:     General: No focal deficit present.     Mental Status: He is alert.  Psychiatric:        Mood and Affect: Mood normal.        Behavior: Behavior normal.     Lab Results  Component Value Date   WBC 10.1 08/07/2022   HGB 11.9 (L) 08/07/2022   HCT 38.0 (L) 08/07/2022   PLT 163.0 Repeated and verified X2. 08/07/2022   GLUCOSE 86 08/04/2022   CHOL 201 (H) 05/16/2022   TRIG 147.0 05/16/2022   HDL 40.20 05/16/2022   LDLCALC 131 (H) 05/16/2022   ALT 14 05/16/2022   AST 17 05/16/2022   NA 139 08/04/2022   K 4.1 08/04/2022   CL 99 08/04/2022   CREATININE 1.91 (H) 08/04/2022   BUN 18 08/04/2022   CO2 25 08/04/2022   TSH 1.06 05/16/2022   PSA 10.49 (H) 06/27/2017   HGBA1C 5.9 02/19/2020    CT PELVIS WO CONTRAST  Result Date: 08/04/2022 CLINICAL DATA:  Possible perianal abscess or fistula. EXAM: CT PELVIS WITHOUT CONTRAST TECHNIQUE: Multidetector CT imaging of the pelvis was performed following the standard protocol without intravenous contrast. RADIATION DOSE REDUCTION: This exam was performed according to the departmental  dose-optimization program which includes automated exposure control, adjustment of the mA and/or kV according to patient size and/or use of iterative reconstruction technique. COMPARISON:  Jun 21, 2022. FINDINGS: Urinary Tract: Visualized ureters and urinary bladder are unremarkable. Bowel: Sigmoid diverticulosis without inflammation. No evidence of bowel obstruction. No definite perianal abscess is noted. Vascular/Lymphatic: Aortic atherosclerosis.  No adenopathy is noted. Reproductive:  Moderate prostatic enlargement is noted. Other: Small fat containing left inguinal hernia. No ascites is noted. Musculoskeletal: No acute abnormality is noted. IMPRESSION: Sigmoid diverticulosis without inflammation. Moderate prostatic enlargement. Small fat containing left inguinal hernia. No acute abnormality seen in the pelvis. Aortic Atherosclerosis (ICD10-I70.0). Electronically Signed   By: Lupita Raider M.D.   On: 08/04/2022 14:43    Assessment & Plan:   Allergic drug rash- The rash has resolved.  Dietary folate deficiency anemia - Folic acid has been started  Thrombocytopenia (HCC) - Platelets are normal now.     Follow-up: No follow-ups on file.  Sanda Linger, MD

## 2022-08-16 ENCOUNTER — Telehealth: Payer: Self-pay

## 2022-08-16 NOTE — Telephone Encounter (Signed)
-----   Message from Pricilla Riffle, MD sent at 08/11/2022  7:03 PM EDT ----- Contact patients wife to set up appt please ----- Message ----- From: Bunnie Domino Sent: 08/11/2022  12:11 PM EDT To: Pricilla Riffle, MD  He is doing well other than the terrible reaction he had to antibiotics.  We plan to follow-up a lipid panel in a few weeks to see if further titration is needed.  He mentioned that he would like his wife to see you as well.  He wanted me to ask you if you are taking new patients and if you would consider taking her own.  Thanks!

## 2022-08-16 NOTE — Telephone Encounter (Signed)
Pt and his wife a new pt to see Dr Tenny Craw 09/25/22... pt will have labs prior.

## 2022-08-16 NOTE — Telephone Encounter (Signed)
Left a message for the pt to call back to make post hosp appt... 11/2022 may be too far off per Dr Tenny Craw.

## 2022-08-18 ENCOUNTER — Encounter (HOSPITAL_COMMUNITY): Payer: Self-pay

## 2022-08-21 ENCOUNTER — Telehealth (HOSPITAL_COMMUNITY): Payer: Self-pay

## 2022-08-22 ENCOUNTER — Encounter (HOSPITAL_COMMUNITY)
Admission: RE | Admit: 2022-08-22 | Discharge: 2022-08-22 | Disposition: A | Discharge status: Home / Self Care | Payer: Medicare Other | Source: Ambulatory Visit | Attending: Internal Medicine | Admitting: Internal Medicine

## 2022-08-22 VITALS — BP 116/70 | HR 76 | Ht 68.8 in | Wt 181.7 lb

## 2022-08-22 DIAGNOSIS — Z955 Presence of coronary angioplasty implant and graft: Secondary | ICD-10-CM | POA: Insufficient documentation

## 2022-08-22 DIAGNOSIS — Z48812 Encounter for surgical aftercare following surgery on the circulatory system: Secondary | ICD-10-CM | POA: Insufficient documentation

## 2022-08-22 NOTE — Progress Notes (Signed)
Cardiac Individual Treatment Plan  Patient Details  Name: Chad Palmer MRN: [id number] Date of Birth: 12-23-1945 Referring Provider:   Flowsheet Row INTENSIVE CARDIAC REHAB ORIENT from 08/22/2022 in [facility]  Referring Provider Dietrich Pates, MD       Initial Encounter Date:  Flowsheet Row INTENSIVE CARDIAC REHAB ORIENT from 08/22/2022 in [facility]  Date 08/22/22       Visit Diagnosis: 08/01/22 Status post coronary artery stent placement LAD  Patient's Home Medications on Admission:  Current Outpatient Medications:    aspirin EC 81 MG tablet, Take 1 tablet (81 mg total) by mouth daily. Swallow whole., Disp: 30 tablet, Rfl: 0   atorvastatin (LIPITOR) 40 MG tablet, Take 1 tablet (40 mg total) by mouth daily., Disp: 90 tablet, Rfl: 1   Calcium-Vitamin D-Vitamin K (CALCIUM + D + K PO), Take 1 tablet by mouth daily., Disp: , Rfl:    clopidogrel (PLAVIX) 75 MG tablet, Take 1 tablet (75 mg total) by mouth daily., Disp: 30 tablet, Rfl: 6   Cyanocobalamin (B-12) 2500 MCG TABS, Take 2,500 mcg by mouth daily., Disp: , Rfl:    finasteride (PROSCAR) 5 MG tablet, Take 1 tablet (5 mg total) by mouth daily., Disp: 90 tablet, Rfl: 3   fluticasone (FLONASE) 50 MCG/ACT nasal spray, PLACE 2 SPRAYS INTO BOTH NOSTRILS DAILY, Disp: 16 mL, Rfl: 0   folic acid (FOLVITE) 1 MG tablet, Take 1 tablet (1 mg total) by mouth daily., Disp: 90 tablet, Rfl: 1   Histamine Dihydrochloride (AUSTRALIAN DREAM ARTHRITIS EX), Apply 1 Application topically daily as needed (pain)., Disp: , Rfl:    lidocaine (LIDODERM) 5 %, Place 1 patch onto the skin daily as needed (pain). Remove & Discard patch within 12 hours or as directed by MD, Disp: , Rfl:    Magnesium 250 MG TABS, Take 250 mg by mouth daily., Disp: , Rfl:    nitroGLYCERIN (NITROSTAT) 0.4 MG SL tablet, Place 1 tablet (0.4 mg total) under the tongue  every 5 (five) minutes as needed for chest pain., Disp: 20 tablet, Rfl: 3   olmesartan-hydrochlorothiazide (BENICAR HCT) 20-12.5 MG tablet, Take 1 tablet by mouth daily., Disp: , Rfl:    pantoprazole (PROTONIX) 40 MG tablet, Take 1 tablet (40 mg total) by mouth daily., Disp: 30 tablet, Rfl: 1   Probiotic Product (ALIGN) 4 MG CAPS, Take 4 mg by mouth at bedtime., Disp: , Rfl:    sildenafil (VIAGRA) 100 MG tablet, Take 50 mg by mouth daily as needed for erectile dysfunction., Disp: , Rfl:    zolpidem (AMBIEN) 10 MG tablet, Take 1 tablet (10 mg total) by mouth at bedtime., Disp: 90 tablet, Rfl: 1   sulfamethoxazole-trimethoprim (BACTRIM) 400-80 MG tablet, Take 1 tablet by mouth 2 (two) times daily. (Patient not taking: Reported on 08/22/2022), Disp: 14 tablet, Rfl: 0  Past Medical History: Past Medical History:  Diagnosis Date   Allergic rhinitis    Arthritis    BPH (benign prostatic hyperplasia)    Cancer (HCC)    kidney    Cataract    bilateral    Cervical spinal stenosis    Dr Dohmier   Colon polyp    Diverticulosis of colon    ED (erectile dysfunction)    Elevated PSA    GERD (gastroesophageal reflux disease)    Hemorrhoids    HTN (hypertension)    Microcytosis  Tobacco Use: Social History   Tobacco Use  Smoking Status Never  Smokeless Tobacco Never    Labs: Review Flowsheet  More data exists      Latest Ref Rng & Units 07/02/2018 07/03/2019 02/19/2020 01/26/2021 05/16/2022  Labs for ITP Cardiac and Pulmonary Rehab  Cholestrol 0 - 200 mg/dL 409  811  - 914  782   LDL (calc) 0 - 99 mg/dL 956  213  - 086  578   HDL-C >39.00 mg/dL 46.96  29.52  - 84.13  40.20   Trlycerides 0.0 - 149.0 mg/dL 244.0  10.2  - 725.3  664.4   Hemoglobin A1c 4.6 - 6.5 % - - 5.9  - -    Details            Capillary Blood Glucose: Lab Results  Component Value Date   GLUCAP 92 11/21/2019     Exercise Target Goals: Exercise Program Goal: Individual exercise prescription set using  results from initial 6 min walk test and THRR while considering  patient's activity barriers and safety.   Exercise Prescription Goal: Initial exercise prescription builds to 30-45 minutes a day of aerobic activity, 2-3 days per week.  Home exercise guidelines will be given to patient during program as part of exercise prescription that the participant will acknowledge.  Activity Barriers & Risk Stratification:  Activity Barriers & Cardiac Risk Stratification - 08/22/22 1436       Activity Barriers & Cardiac Risk Stratification   Activity Barriers Balance Concerns;Arthritis;Joint Problems;Back Problems;Neck/Spine Problems    Cardiac Risk Stratification High             6 Minute Walk:  6 Minute Walk     Row Name 08/22/22 1439         6 Minute Walk   Phase Initial     Distance 1416 feet     Walk Time 6 minutes     # of Rest Breaks 0     MPH 2.7     METS 2.72     RPE 11     Perceived Dyspnea  0     VO2 Peak 9.52     Symptoms No     Resting HR 71 bpm     Resting BP 116/70     Resting Oxygen Saturation  97 %     Exercise Oxygen Saturation  during 6 min walk 99 %     Max Ex. HR 102 bpm     Max Ex. BP 130/68     2 Minute Post BP 114/66              Oxygen Initial Assessment:   Oxygen Re-Evaluation:   Oxygen Discharge (Final Oxygen Re-Evaluation):   Initial Exercise Prescription:  Initial Exercise Prescription - 08/22/22 1400       Date of Initial Exercise RX and Referring Provider   Date 08/22/22    Referring Provider Dietrich Pates, MD    Expected Discharge Date 11/08/22      Recumbant Elliptical   Level 2    RPM 50    Watts 25    Minutes 15    METs 2.72      Track   Laps 14    Minutes 15    METs 2.72      Prescription Details   Frequency (times per week) 3    Duration Progress to 30 minutes of continuous aerobic without signs/symptoms of physical distress      Intensity  THRR 40-80% of Max Heartrate 11/15/22    Ratings of Perceived  Exertion 11-13    Perceived Dyspnea 0-4      Progression   Progression Continue progressive overload as per policy without signs/symptoms or physical distress.      Resistance Training   Training Prescription Yes    Weight 3 lbs    Reps 10-15             Perform Capillary Blood Glucose checks as needed.  Exercise Prescription Changes:   Exercise Comments:   Exercise Goals and Review:   Exercise Goals     Row Name 08/22/22 1322             Exercise Goals   Increase Physical Activity Yes       Intervention Provide advice, education, support and counseling about physical activity/exercise needs.;Develop an individualized exercise prescription for aerobic and resistive training based on initial evaluation findings, risk stratification, comorbidities and participant's personal goals.       Expected Outcomes Short Term: Attend rehab on a regular basis to increase amount of physical activity.;Long Term: Exercising regularly at least 3-5 days a week.;Long Term: Add in home exercise to make exercise part of routine and to increase amount of physical activity.       Increase Strength and Stamina Yes       Intervention Provide advice, education, support and counseling about physical activity/exercise needs.;Develop an individualized exercise prescription for aerobic and resistive training based on initial evaluation findings, risk stratification, comorbidities and participant's personal goals.       Expected Outcomes Short Term: Increase workloads from initial exercise prescription for resistance, speed, and METs.;Short Term: Perform resistance training exercises routinely during rehab and add in resistance training at home;Long Term: Improve cardiorespiratory fitness, muscular endurance and strength as measured by increased METs and functional capacity ( )       Able to understand and use rate of perceived exertion (RPE) scale Yes       Intervention Provide education and  explanation on how to use RPE scale       Expected Outcomes Short Term: Able to use RPE daily in rehab to express subjective intensity level;Long Term:  Able to use RPE to guide intensity level when exercising independently       Knowledge and understanding of Target Heart Rate Range (THRR) Yes       Expected Outcomes Short Term: Able to state/look up THRR;Long Term: Able to use THRR to govern intensity when exercising independently;Short Term: Able to use daily as guideline for intensity in rehab       Understanding of Exercise Prescription Yes       Intervention Provide education, explanation, and written materials on patient's individual exercise prescription       Expected Outcomes Short Term: Able to explain program exercise prescription;Long Term: Able to explain home exercise prescription to exercise independently                Exercise Goals Re-Evaluation :   Discharge Exercise Prescription (Final Exercise Prescription Changes):   Nutrition:  Target Goals: Understanding of nutrition guidelines, daily intake of sodium 1500mg , cholesterol 200mg , calories 30% from fat and 7% or less from saturated fats, daily to have 5 or more servings of fruits and vegetables.  Biometrics:  Pre Biometrics - 08/22/22 1330       Pre Biometrics   % Body Fat 27 %             Post  Biometrics - 08/22/22 1330        Post  Biometrics   Waist Circumference 38 inches    Hip Circumference 41 inches    Waist to Hip Ratio 0.93 %    Triceps Skinfold 16 mm    Grip Strength 34 kg    Flexibility 0 in   Pt could not reach box   Single Leg Stand 6.8 seconds             Nutrition Therapy Plan and Nutrition Goals:   Nutrition Assessments:  MEDIFICTS Score Key: ?70 Need to make dietary changes  40-70 Heart Healthy Diet ? 40 Therapeutic Level Cholesterol Diet    Picture Your Plate Scores: <16 Unhealthy dietary pattern with much room for improvement. 41-50 Dietary pattern unlikely  to meet recommendations for good health and room for improvement. 51-60 More healthful dietary pattern, with some room for improvement.  >60 Healthy dietary pattern, although there may be some specific behaviors that could be improved.    Nutrition Goals Re-Evaluation:   Nutrition Goals Re-Evaluation:   Nutrition Goals Discharge (Final Nutrition Goals Re-Evaluation):   Psychosocial: Target Goals: Acknowledge presence or absence of significant depression and/or stress, maximize coping skills, provide positive support system. Participant is able to verbalize types and ability to use techniques and skills needed for reducing stress and depression.  Initial Review & Psychosocial Screening:  Initial Psych Review & Screening - 08/22/22 1437       Initial Review   Current issues with None Identified      Family Dynamics   Good Support System? Yes   Pt has spouse for support     Barriers   Psychosocial barriers to participate in program There are no identifiable barriers or psychosocial needs.      Screening Interventions   Interventions Encouraged to exercise             Quality of Life Scores:  Quality of Life - 08/22/22 1513       Quality of Life   Select Quality of Life      Quality of Life Scores   Health/Function Pre 26.73 %    Socioeconomic Pre 30 %    Psych/Spiritual Pre 30 %    Family Pre 30 %    GLOBAL Pre 28.52 %            Scores of 19 and below usually indicate a poorer quality of life in these areas.  A difference of  2-3 points is a clinically meaningful difference.  A difference of 2-3 points in the total score of the Quality of Life Index has been associated with significant improvement in overall quality of life, self-image, physical symptoms, and general health in studies assessing change in quality of life.  PHQ-9: Review Flowsheet  More data exists      08/22/2022 08/04/2022 05/16/2022 11/09/2021 04/13/2021  Depression screen PHQ 2/9  Decreased  Interest 0 0 0 0 0  Down, Depressed, Hopeless 0 0 0 0 0  PHQ - 2 Score 0 0 0 0 0  Altered sleeping 1 - - - -  Tired, decreased energy 1 - - - -  Change in appetite 0 - - - -  Feeling bad or failure about yourself  0 - - - -  Trouble concentrating 0 - - - -  Moving slowly or fidgety/restless 0 - - - -  Suicidal thoughts 0 - - - -  PHQ-9 Score 2 - - - -  Difficult  doing work/chores Somewhat difficult - - - -    Details           Interpretation of Total Score  Total Score Depression Severity:  1-4 = Minimal depression, 5-9 = Mild depression, 10-14 = Moderate depression, 15-19 = Moderately severe depression, 20-27 = Severe depression   Psychosocial Evaluation and Intervention:   Psychosocial Re-Evaluation:   Psychosocial Discharge (Final Psychosocial Re-Evaluation):   Vocational Rehabilitation: Provide vocational rehab assistance to qualifying candidates.   Vocational Rehab Evaluation & Intervention:  Vocational Rehab - 08/22/22 1438       Initial Vocational Rehab Evaluation & Intervention   Assessment shows need for Vocational Rehabilitation No   Pt is retired, no vocational needs            Education: Education Goals: Education classes will be provided on a weekly basis, covering required topics. Participant will state understanding/return demonstration of topics presented.     Core Videos: Exercise    Move It!  Clinical staff conducted group or individual video education with verbal and written material and guidebook.  Patient learns the recommended Pritikin exercise program. Exercise with the goal of living a long, healthy life. Some of the health benefits of exercise include controlled diabetes, healthier blood pressure levels, improved cholesterol levels, improved heart and lung capacity, improved sleep, and better body composition. Everyone should speak with their doctor before starting or changing an exercise routine.  Biomechanical Limitations Clinical  staff conducted group or individual video education with verbal and written material and guidebook.  Patient learns how biomechanical limitations can impact exercise and how we can mitigate and possibly overcome limitations to have an impactful and balanced exercise routine.  Body Composition Clinical staff conducted group or individual video education with verbal and written material and guidebook.  Patient learns that body composition (ratio of muscle mass to fat mass) is a key component to assessing overall fitness, rather than body weight alone. Increased fat mass, especially visceral belly fat, can put [place] at increased risk for metabolic syndrome, type 2 diabetes, heart disease, and even death. It is recommended to combine diet and exercise (cardiovascular and resistance training) to improve your body composition. Seek guidance from your physician and exercise physiologist before implementing an exercise routine.  Exercise Action Plan Clinical staff conducted group or individual video education with verbal and written material and guidebook.  Patient learns the recommended strategies to achieve and enjoy long-term exercise adherence, including variety, self-motivation, self-efficacy, and positive decision making. Benefits of exercise include fitness, good health, weight management, more energy, better sleep, less stress, and overall well-being.  Medical   Heart Disease Risk Reduction Clinical staff conducted group or individual video education with verbal and written material and guidebook.  Patient learns our heart is our most vital organ as it circulates oxygen, nutrients, white blood cells, and hormones throughout the entire body, and carries waste away. Data supports a plant-based eating plan like the Pritikin Program for its effectiveness in slowing progression of and reversing heart disease. The video provides a number of recommendations to address heart disease.   Metabolic Syndrome and  Belly Fat  Clinical staff conducted group or individual video education with verbal and written material and guidebook.  Patient learns what metabolic syndrome is, how it leads to heart disease, and how one can reverse it and keep it from coming back. You have metabolic syndrome if you have 3 of the following 5 criteria: abdominal obesity, high blood pressure, high triglycerides, low HDL cholesterol,  and high blood sugar.  Hypertension and Heart Disease Clinical staff conducted group or individual video education with verbal and written material and guidebook.  Patient learns that high blood pressure, or hypertension, is very common in the [place]. Hypertension is largely due to excessive salt intake, but other important risk factors include being overweight, physical inactivity, drinking too much alcohol, smoking, and not eating enough potassium from fruits and vegetables. High blood pressure is a leading risk factor for heart attack, stroke, congestive heart failure, dementia, kidney failure, and premature death. Long-term effects of excessive salt intake include stiffening of the arteries and thickening of heart muscle and organ damage. Recommendations include ways to reduce hypertension and the risk of heart disease.  Diseases of Our Time - Focusing on Diabetes Clinical staff conducted group or individual video education with verbal and written material and guidebook.  Patient learns why the best way to stop diseases of our time is prevention, through food and other lifestyle changes. Medicine (such as prescription pills and surgeries) is often only a Band-Aid on the problem, not a long-term solution. Most common diseases of our time include obesity, type 2 diabetes, hypertension, heart disease, and cancer. The Pritikin Program is recommended and has been proven to help reduce, reverse, and/or prevent the damaging effects of metabolic syndrome.  Nutrition   Overview of the Pritikin Eating Plan   Clinical staff conducted group or individual video education with verbal and written material and guidebook.  Patient learns about the Pritikin Eating Plan for disease risk reduction. The Pritikin Eating Plan emphasizes a wide variety of unrefined, minimally-processed carbohydrates, like fruits, vegetables, whole grains, and legumes. Go, Caution, and Stop food choices are explained. Plant-based and lean animal proteins are emphasized. Rationale provided for low sodium intake for blood pressure control, low added sugars for blood sugar stabilization, and low added fats and oils for coronary artery disease risk reduction and weight management.  Calorie Density  Clinical staff conducted group or individual video education with verbal and written material and guidebook.  Patient learns about calorie density and how it impacts the Pritikin Eating Plan. Knowing the characteristics of the food you choose will help you decide whether those foods will lead to weight gain or weight loss, and whether you want to consume more or less of them. Weight loss is usually a side effect of the Pritikin Eating Plan because of its focus on low calorie-dense foods.  Label Reading  Clinical staff conducted group or individual video education with verbal and written material and guidebook.  Patient learns about the Pritikin recommended label reading guidelines and corresponding recommendations regarding calorie density, added sugars, sodium content, and whole grains.  Dining Out - Part 1  Clinical staff conducted group or individual video education with verbal and written material and guidebook.  Patient learns that restaurant meals can be sabotaging because they can be so high in calories, fat, sodium, and/or sugar. Patient learns recommended strategies on how to positively address this and avoid unhealthy pitfalls.  Facts on Fats  Clinical staff conducted group or individual video education with verbal and written  material and guidebook.  Patient learns that lifestyle modifications can be just as effective, if not more so, as many medications for lowering your risk of heart disease. A Pritikin lifestyle can help to reduce your risk of inflammation and atherosclerosis (cholesterol build-up, or plaque, in the artery walls). Lifestyle interventions such as dietary choices and physical activity address the cause of atherosclerosis. A review of  the types of fats and their impact on blood cholesterol levels, along with dietary recommendations to reduce fat intake is also included.  Nutrition Action Plan  Clinical staff conducted group or individual video education with verbal and written material and guidebook.  Patient learns how to incorporate Pritikin recommendations into their lifestyle. Recommendations include planning and keeping personal health goals in mind as an important part of their success.  Healthy Mind-Set    Healthy Minds, Bodies, Hearts  Clinical staff conducted group or individual video education with verbal and written material and guidebook.  Patient learns how to identify when they are stressed. Video will discuss the impact of that stress, as well as the many benefits of stress management. Patient will also be introduced to stress management techniques. The way we think, act, and feel has an impact on our hearts.  How Our Thoughts Can Heal Our Hearts  Clinical staff conducted group or individual video education with verbal and written material and guidebook.  Patient learns that negative thoughts can cause depression and anxiety. This can result in negative lifestyle behavior and serious health problems. Cognitive behavioral therapy is an effective method to help control our thoughts in order to change and improve our emotional outlook.  Additional Videos:  Exercise    Improving Performance  Clinical staff conducted group or individual video education with verbal and written material and  guidebook.  Patient learns to use a non-linear approach by alternating intensity levels and lengths of time spent exercising to help burn more calories and lose more body fat. Cardiovascular exercise helps improve heart health, metabolism, hormonal balance, blood sugar control, and recovery from fatigue. Resistance training improves strength, endurance, balance, coordination, reaction time, metabolism, and muscle mass. Flexibility exercise improves circulation, posture, and balance. Seek guidance from your physician and exercise physiologist before implementing an exercise routine and learn your capabilities and proper form for all exercise.  Introduction to Yoga  Clinical staff conducted group or individual video education with verbal and written material and guidebook.  Patient learns about yoga, a discipline of the coming together of mind, breath, and body. The benefits of yoga include improved flexibility, improved range of motion, better posture and core strength, increased lung function, weight loss, and positive self-image. Yoga's heart health benefits include lowered blood pressure, healthier heart rate, decreased cholesterol and triglyceride levels, improved immune function, and reduced stress. Seek guidance from your physician and exercise physiologist before implementing an exercise routine and learn your capabilities and proper form for all exercise.  Medical   Aging: Enhancing Your Quality of Life  Clinical staff conducted group or individual video education with verbal and written material and guidebook.  Patient learns key strategies and recommendations to stay in good physical health and enhance quality of life, such as prevention strategies, having an advocate, securing a Health Care Proxy and Power of Attorney, and keeping a list of medications and system for tracking them. It also discusses how to avoid risk for bone loss.  Biology of Weight Control  Clinical staff conducted group or  individual video education with verbal and written material and guidebook.  Patient learns that weight gain occurs because we consume more calories than we burn (eating more, moving less). Even if your body weight is normal, you may have higher ratios of fat compared to muscle mass. Too much body fat puts you at increased risk for cardiovascular disease, heart attack, stroke, type 2 diabetes, and obesity-related cancers. In addition to exercise, following the Pritikin Eating  Plan can help reduce your risk.  Decoding Lab Results  Clinical staff conducted group or individual video education with verbal and written material and guidebook.  Patient learns that lab test reflects one measurement whose values change over time and are influenced by many factors, including medication, stress, sleep, exercise, food, hydration, pre-existing medical conditions, and more. It is recommended to use the knowledge from this video to become more involved with your lab results and evaluate your numbers to speak with your doctor.   Diseases of Our Time - Overview  Clinical staff conducted group or individual video education with verbal and written material and guidebook.  Patient learns that according to the CDC, 50% to 70% of chronic diseases (such as obesity, type 2 diabetes, elevated lipids, hypertension, and heart disease) are avoidable through lifestyle improvements including healthier food choices, listening to satiety cues, and increased physical activity.  Sleep Disorders Clinical staff conducted group or individual video education with verbal and written material and guidebook.  Patient learns how good quality and duration of sleep are important to overall health and well-being. Patient also learns about sleep disorders and how they impact health along with recommendations to address them, including discussing with a physician.  Nutrition  Dining Out - Part 2 Clinical staff conducted group or individual video  education with verbal and written material and guidebook.  Patient learns how to plan ahead and communicate in order to maximize their dining experience in a healthy and nutritious manner. Included are recommended food choices based on the type of restaurant the patient is visiting.   Fueling a Banker conducted group or individual video education with verbal and written material and guidebook.  There is a strong connection between our food choices and our health. Diseases like obesity and type 2 diabetes are very prevalent and are in large-part due to lifestyle choices. The Pritikin Eating Plan provides plenty of food and hunger-curbing satisfaction. It is easy to follow, affordable, and helps reduce health risks.  Menu Workshop  Clinical staff conducted group or individual video education with verbal and written material and guidebook.  Patient learns that restaurant meals can sabotage health goals because they are often packed with calories, fat, sodium, and sugar. Recommendations include strategies to plan ahead and to communicate with the manager, chef, or server to help order a healthier meal.  Planning Your Eating Strategy  Clinical staff conducted group or individual video education with verbal and written material and guidebook.  Patient learns about the Pritikin Eating Plan and its benefit of reducing the risk of disease. The Pritikin Eating Plan does not focus on calories. Instead, it emphasizes high-quality, nutrient-rich foods. By knowing the characteristics of the foods, we choose, we can determine their calorie density and make informed decisions.  Targeting Your Nutrition Priorities  Clinical staff conducted group or individual video education with verbal and written material and guidebook.  Patient learns that lifestyle habits have a tremendous impact on disease risk and progression. This video provides eating and physical activity recommendations based on your  personal health goals, such as reducing LDL cholesterol, losing weight, preventing or controlling type 2 diabetes, and reducing high blood pressure.  Vitamins and Minerals  Clinical staff conducted group or individual video education with verbal and written material and guidebook.  Patient learns different ways to obtain key vitamins and minerals, including through a recommended healthy diet. It is important to discuss all supplements you take with your doctor.   Healthy Mind-Set  Smoking Cessation  Clinical staff conducted group or individual video education with verbal and written material and guidebook.  Patient learns that cigarette smoking and tobacco addiction pose a serious health risk which affects millions of people. Stopping smoking will significantly reduce the risk of heart disease, lung disease, and many forms of cancer. Recommended strategies for quitting are covered, including working with your doctor to develop a successful plan.  Culinary   Becoming a Set designer conducted group or individual video education with verbal and written material and guidebook.  Patient learns that cooking at home can be healthy, cost-effective, quick, and puts them in control. Keys to cooking healthy recipes will include looking at your recipe, assessing your equipment needs, planning ahead, making it simple, choosing cost-effective seasonal ingredients, and limiting the use of added fats, salts, and sugars.  Cooking - Breakfast and Snacks  Clinical staff conducted group or individual video education with verbal and written material and guidebook.  Patient learns how important breakfast is to satiety and nutrition through the entire day. Recommendations include key foods to eat during breakfast to help stabilize blood sugar levels and to prevent overeating at meals later in the day. Planning ahead is also a key component.  Cooking - Educational psychologist conducted  group or individual video education with verbal and written material and guidebook.  Patient learns eating strategies to improve overall health, including an approach to cook more at home. Recommendations include thinking of animal protein as a side on your plate rather than center stage and focusing instead on lower calorie dense options like vegetables, fruits, whole grains, and plant-based proteins, such as beans. Making sauces in large quantities to freeze for later and leaving the skin on your vegetables are also recommended to maximize your experience.  Cooking - Healthy Salads and Dressing Clinical staff conducted group or individual video education with verbal and written material and guidebook.  Patient learns that vegetables, fruits, whole grains, and legumes are the foundations of the Pritikin Eating Plan. Recommendations include how to incorporate each of these in flavorful and healthy salads, and how to create homemade salad dressings. Proper handling of ingredients is also covered. Cooking - Soups and [employer] - Soups and Desserts Clinical staff conducted group or individual video education with verbal and written material and guidebook.  Patient learns that Pritikin soups and desserts make for easy, nutritious, and delicious snacks and meal components that are low in sodium, fat, sugar, and calorie density, while high in vitamins, minerals, and filling fiber. Recommendations include simple and healthy ideas for soups and desserts.   Overview     The Pritikin Solution Program Overview Clinical staff conducted group or individual video education with verbal and written material and guidebook.  Patient learns that the results of the Pritikin Program have been documented in more than 100 articles published in peer-reviewed journals, and the benefits include reducing risk factors for (and, in some cases, even reversing) high cholesterol, high blood pressure, type 2 diabetes, obesity,  and more! An overview of the three key pillars of the Pritikin Program will be covered: eating well, doing regular exercise, and having a healthy mind-set.  WORKSHOPS  Exercise: Exercise Basics: Building Your Action Plan Clinical staff led group instruction and group discussion with PowerPoint presentation and patient guidebook. To enhance the learning environment the use of posters, models and videos may be added. At the conclusion of this workshop, patients will comprehend the difference between  physical activity and exercise, as well as the benefits of incorporating both, into their routine. Patients will understand the FITT (Frequency, Intensity, Time, and Type) principle and how to use it to build an exercise action plan. In addition, safety concerns and other considerations for exercise and cardiac rehab will be addressed by the presenter. The purpose of this lesson is to promote a comprehensive and effective weekly exercise routine in order to improve patients' overall level of fitness.   Managing Heart Disease: Your Path to a Healthier Heart Clinical staff led group instruction and group discussion with PowerPoint presentation and patient guidebook. To enhance the learning environment the use of posters, models and videos may be added.At the conclusion of this workshop, patients will understand the anatomy and physiology of the heart. Additionally, they will understand how Pritikin's three pillars impact the risk factors, the progression, and the management of heart disease.  The purpose of this lesson is to provide a high-level overview of the heart, heart disease, and how the Pritikin lifestyle positively impacts risk factors.  Exercise Biomechanics Clinical staff led group instruction and group discussion with PowerPoint presentation and patient guidebook. To enhance the learning environment the use of posters, models and videos may be added. Patients will learn how the structural  parts of their bodies function and how these functions impact their daily activities, movement, and exercise. Patients will learn how to promote a neutral spine, learn how to manage pain, and identify ways to improve their physical movement in order to promote healthy living. The purpose of this lesson is to expose patients to common physical limitations that impact physical activity. Participants will learn practical ways to adapt and manage aches and pains, and to minimize their effect on regular exercise. Patients will learn how to maintain good posture while sitting, walking, and lifting.  Balance Training and Fall Prevention  Clinical staff led group instruction and group discussion with PowerPoint presentation and patient guidebook. To enhance the learning environment the use of posters, models and videos may be added. At the conclusion of this workshop, patients will understand the importance of their sensorimotor skills (vision, proprioception, and the vestibular system) in maintaining their ability to balance as they age. Patients will apply a variety of balancing exercises that are appropriate for their current level of function. Patients will understand the common causes for poor balance, possible solutions to these problems, and ways to modify their physical environment in order to minimize their fall risk. The purpose of this lesson is to teach patients about the importance of maintaining balance as they age and ways to minimize their risk of falling.  WORKSHOPS   Nutrition:  Fueling a Ship broker led group instruction and group discussion with PowerPoint presentation and patient guidebook. To enhance the learning environment the use of posters, models and videos may be added. Patients will review the foundational principles of the Pritikin Eating Plan and understand what constitutes a serving size in each of the food groups. Patients will also learn Pritikin-friendly  foods that are better choices when away from home and review make-ahead meal and snack options. Calorie density will be reviewed and applied to three nutrition priorities: weight maintenance, weight loss, and weight gain. The purpose of this lesson is to reinforce (in a group setting) the key concepts around what patients are recommended to eat and how to apply these guidelines when away from home by planning and selecting Pritikin-friendly options. Patients will understand how calorie density may be adjusted  for different weight management goals.  Mindful Eating  Clinical staff led group instruction and group discussion with PowerPoint presentation and patient guidebook. To enhance the learning environment the use of posters, models and videos may be added. Patients will briefly review the concepts of the Pritikin Eating Plan and the importance of low-calorie dense foods. The concept of mindful eating will be introduced as well as the importance of paying attention to internal hunger signals. Triggers for non-hunger eating and techniques for dealing with triggers will be explored. The purpose of this lesson is to provide patients with the opportunity to review the basic principles of the Pritikin Eating Plan, discuss the value of eating mindfully and how to measure internal cues of hunger and fullness using the Hunger Scale. Patients will also discuss reasons for non-hunger eating and learn strategies to use for controlling emotional eating.  Targeting Your Nutrition Priorities Clinical staff led group instruction and group discussion with PowerPoint presentation and patient guidebook. To enhance the learning environment the use of posters, models and videos may be added. Patients will learn how to determine their genetic susceptibility to disease by reviewing their family history. Patients will gain insight into the importance of diet as part of an overall healthy lifestyle in mitigating the impact of  genetics and other environmental insults. The purpose of this lesson is to provide patients with the opportunity to assess their personal nutrition priorities by looking at their family history, their own health history and current risk factors. Patients will also be able to discuss ways of prioritizing and modifying the Pritikin Eating Plan for their highest risk areas  Menu  Clinical staff led group instruction and group discussion with PowerPoint presentation and patient guidebook. To enhance the learning environment the use of posters, models and videos may be added. Using menus brought in from [employer], or printed from [employer], patients will apply the Pritikin dining out guidelines that were presented in the [employer] video. Patients will also be able to practice these guidelines in a variety of provided scenarios. The purpose of this lesson is to provide patients with the opportunity to practice hands-on learning of the Pritikin Dining Out guidelines with actual menus and practice scenarios.  Label Reading Clinical staff led group instruction and group discussion with PowerPoint presentation and patient guidebook. To enhance the learning environment the use of posters, models and videos may be added. Patients will review and discuss the Pritikin label reading guidelines presented in Pritikin's Label Reading Educational series video. Using fool labels brought in from local grocery stores and markets, patients will apply the label reading guidelines and determine if the packaged food meet the Pritikin guidelines. The purpose of this lesson is to provide patients with the opportunity to review, discuss, and practice hands-on learning of the Pritikin Label Reading guidelines with actual packaged food labels. Cooking School  Pritikin's [employer] are designed to teach patients ways to prepare quick, simple, and affordable recipes at home. The importance of  nutrition's role in chronic disease risk reduction is reflected in its emphasis in the overall Pritikin program. By learning how to prepare essential core Pritikin Eating Plan recipes, patients will increase control over what they eat; be able to customize the flavor of foods without the use of added salt, sugar, or fat; and improve the quality of the food they consume. By learning a set of core recipes which are easily assembled, quickly prepared, and affordable, patients are more likely to prepare  more healthy foods at home. These workshops focus on convenient breakfasts, simple entres, side dishes, and desserts which can be prepared with minimal effort and are consistent with nutrition recommendations for cardiovascular risk reduction. Cooking [employer] are taught by a Armed forces logistics/support/administrative officer (RD) who has been trained by the [employer]. The chef or RD has a clear understanding of the importance of minimizing - if not completely eliminating - added fat, sugar, and sodium in recipes. Throughout the series of Cooking School Workshop sessions, patients will learn about healthy ingredients and efficient methods of cooking to build confidence in their capability to prepare    Cooking School weekly topics:  Adding Flavor- Sodium-Free  Fast and Healthy Breakfasts  Powerhouse Plant-Based Proteins  Satisfying Salads and Dressings  Simple Sides and Sauces  International Cuisine-Spotlight on the [employer] Zones  Delicious Desserts  Savory Soups  [employer] - Meals in a Astronomer Appetizers and Snacks  Comforting Weekend Breakfasts  One-Pot Wonders   Fast Evening Meals  Landscape architect Your Pritikin Plate  WORKSHOPS   Healthy Mindset (Psychosocial):  Focused Goals, Sustainable Changes Clinical staff led group instruction and group discussion with PowerPoint presentation and patient guidebook. To enhance the learning environment the use of posters,  models and videos may be added. Patients will be able to apply effective goal setting strategies to establish at least one personal goal, and then take consistent, meaningful action toward that goal. They will learn to identify common barriers to achieving personal goals and develop strategies to overcome them. Patients will also gain an understanding of how our mind-set can impact our ability to achieve goals and the importance of cultivating a positive and growth-oriented mind-set. The purpose of this lesson is to provide patients with a deeper understanding of how to set and achieve personal goals, as well as the tools and strategies needed to overcome common obstacles which may arise along the way.  From Head to Heart: The Power of a Healthy Outlook  Clinical staff led group instruction and group discussion with PowerPoint presentation and patient guidebook. To enhance the learning environment the use of posters, models and videos may be added. Patients will be able to recognize and describe the impact of emotions and mood on physical health. They will discover the importance of self-care and explore self-care practices which may work for them. Patients will also learn how to utilize the 4 C's to cultivate a healthier outlook and better manage stress and challenges. The purpose of this lesson is to demonstrate to patients how a healthy outlook is an essential part of maintaining good health, especially as they continue their cardiac rehab journey.  Healthy Sleep for a Healthy Heart Clinical staff led group instruction and group discussion with PowerPoint presentation and patient guidebook. To enhance the learning environment the use of posters, models and videos may be added. At the conclusion of this workshop, patients will be able to demonstrate knowledge of the importance of sleep to overall health, well-being, and quality of life. They will understand the symptoms of, and treatments for, common sleep  disorders. Patients will also be able to identify daytime and nighttime behaviors which impact sleep, and they will be able to apply these tools to help manage sleep-related challenges. The purpose of this lesson is to provide patients with a general overview of sleep and outline the importance of quality sleep. Patients will learn about a few of the most common sleep disorders. Patients will  also be introduced to the concept of "sleep hygiene," and discover ways to self-manage certain sleeping problems through simple daily behavior changes. Finally, the workshop will motivate patients by clarifying the links between quality sleep and their goals of heart-healthy living.   Recognizing and Reducing Stress Clinical staff led group instruction and group discussion with PowerPoint presentation and patient guidebook. To enhance the learning environment the use of posters, models and videos may be added. At the conclusion of this workshop, patients will be able to understand the types of stress reactions, differentiate between acute and chronic stress, and recognize the impact that chronic stress has on their health. They will also be able to apply different coping mechanisms, such as reframing negative self-talk. Patients will have the opportunity to practice a variety of stress management techniques, such as deep abdominal breathing, progressive muscle relaxation, and/or guided imagery.  The purpose of this lesson is to educate patients on the role of stress in their lives and to provide healthy techniques for coping with it.  Learning Barriers/Preferences:  Learning Barriers/Preferences - 08/22/22 1438       Learning Barriers/Preferences   Learning Barriers Sight    Learning Preferences Audio;Computer/Internet;Group Instruction;Individual Instruction;Pictoral;Skilled Demonstration;Verbal Instruction;Video;Written Material             Education Topics:  Knowledge Questionnaire Score:  Knowledge  Questionnaire Score - 08/22/22 1514       Knowledge Questionnaire Score   Pre Score 20/24             Core Components/Risk Factors/Patient Goals at Admission:  Personal Goals and Risk Factors at Admission - 08/22/22 1438       Core Components/Risk Factors/Patient Goals on Admission   Hypertension Yes    Intervention Provide education on lifestyle modifcations including regular physical activity/exercise, weight management, moderate sodium restriction and increased consumption of fresh fruit, vegetables, and low fat dairy, alcohol moderation, and smoking cessation.;Monitor prescription use compliance.    Expected Outcomes Short Term: Continued assessment and intervention until BP is < 140/20mm HG in hypertensive participants. < 130/77mm HG in hypertensive participants with diabetes, heart failure or chronic kidney disease.;Long Term: Maintenance of blood pressure at goal levels.    Lipids Yes    Intervention Provide education and support for participant on nutrition & aerobic/resistive exercise along with prescribed medications to achieve LDL 70mg , HDL >40mg .    Expected Outcomes Short Term: Participant states understanding of desired cholesterol values and is compliant with medications prescribed. Participant is following exercise prescription and nutrition guidelines.;Long Term: Cholesterol controlled with medications as prescribed, with individualized exercise RX and with personalized nutrition plan. Value goals: LDL < 70mg , HDL > 40 mg.             Core Components/Risk Factors/Patient Goals Review:    Core Components/Risk Factors/Patient Goals at Discharge (Final Review):    ITP Comments:  ITP Comments     Row Name 08/22/22 1300           ITP Comments Armanda Magic, MD: Medical Director.  Intoduction to the [facility] / Intensive Cardiac Rehab.  Initial orientation packet reviewed with the patient.                Comments: Participant attended  orientation for the cardiac rehabilitation program on  08/22/2022  to perform initial intake and exercise walk test. Patient introduced to the Pritikin Program education and orientation packet was reviewed. Completed 6-minute walk test, measurements, initial ITP, and exercise prescription. Vital signs stable. Telemetry-normal sinus  rhythm, asymptomatic.   Service time was from 12:56 to 15:04.

## 2022-08-22 NOTE — Progress Notes (Signed)
Cardiac Rehab Medication Review   Does the patient  feel that his/her medications are working for him/her?  yes  Has the patient been experiencing any side effects to the medications prescribed?  yes  Does the patient measure his/her own blood pressure or blood glucose at home?  yes   Does the patient have any problems obtaining medications due to transportation or finances?   no  Understanding of regimen: excellent Understanding of indications: excellent Potential of compliance: excellent    Comments: Pt checks blood pressure daily. Pt is not sure which medication was bothering his stomach, but he was recently started on Protonix and is no longer having the issue.    Lorin Picket 08/22/2022 1:18 PM

## 2022-08-27 ENCOUNTER — Other Ambulatory Visit: Payer: Self-pay | Admitting: Internal Medicine

## 2022-08-28 ENCOUNTER — Encounter (HOSPITAL_COMMUNITY)
Admission: RE | Admit: 2022-08-28 | Discharge: 2022-08-28 | Disposition: A | Discharge status: Home / Self Care | Payer: Medicare Other | Source: Ambulatory Visit | Attending: Internal Medicine | Admitting: Internal Medicine

## 2022-08-28 ENCOUNTER — Encounter (HOSPITAL_COMMUNITY): Payer: Medicare Other

## 2022-08-28 DIAGNOSIS — Z955 Presence of coronary angioplasty implant and graft: Secondary | ICD-10-CM | POA: Diagnosis not present

## 2022-08-28 DIAGNOSIS — Z48812 Encounter for surgical aftercare following surgery on the circulatory system: Secondary | ICD-10-CM | POA: Diagnosis not present

## 2022-08-28 NOTE — Progress Notes (Signed)
Daily Session Note  Patient Details  Name: Chad Palmer MRN: [id number] Date of Birth: 1945/10/19 Referring Provider:   Flowsheet Row INTENSIVE CARDIAC REHAB ORIENT from 08/22/2022 in [facility]  Referring Provider Dietrich Pates, MD       Encounter Date: 08/28/2022  Check In:  Session Check In - 08/28/22 1427       Check-In   Supervising physician immediately available to respond to emergencies [facility] MD immediately available    Physician(s) Ezequiel Kayser, NP    Location [facility]    Staff Present Lorin Picket, MS, ACSM-CEP, CCRP, Exercise Physiologist;Nico Syme, RN, Zachery Conch, MS, ACSM-CEP, Exercise Physiologist;Bailey Wallace Cullens, MS, Exercise Physiologist;Jetta Dan Humphreys BS, ACSM-CEP, Exercise Physiologist;Johnny Hale Bogus, MS, Exercise Physiologist    Virtual Visit No    Medication changes reported     No    Fall or balance concerns reported    No    Tobacco Cessation No Change    Warm-up and Cool-down Performed as group-led instruction    Resistance Training Performed Yes    VAD Patient? No    PAD/SET Patient? No      Pain Assessment   Currently in Pain? No/denies    Pain Score 0-No pain    Multiple Pain Sites No             Capillary Blood Glucose: No results found for this or any previous visit (from the past 24 hour(s)).   Exercise Prescription Changes - 08/28/22 1642       Response to Exercise   Blood Pressure (Admit) 130/72    Blood Pressure (Exercise) 150/80    Blood Pressure (Exit) 112/70    Heart Rate (Admit) 70 bpm    Heart Rate (Exercise) 119 bpm    Heart Rate (Exit) 93 bpm    Rating of Perceived Exertion (Exercise) 11.5    Perceived Dyspnea (Exercise) 0    Symptoms 0    Comments Pt first day in the [facility] program    Duration Progress to 30 minutes of  aerobic without signs/symptoms of physical distress    Intensity THRR unchanged      Progression    Progression Continue to progress workloads to maintain intensity without signs/symptoms of physical distress.    Average METs 3.27      Resistance Training   Training Prescription Yes    Weight 3 lbs    Reps 10-15    Time 10 Minutes      Recumbant Elliptical   Level 2    RPM 5.7    Watts 79    Minutes 15    METs 3.5      Track   Laps 16    Minutes 15    METs 3.04             Social History   Tobacco Use  Smoking Status Never  Smokeless Tobacco Never    Goals Met:  Exercise tolerated well No report of concerns or symptoms today Strength training completed today  Goals Unmet:  Not Applicable  Comments: Pt started cardiac rehab today.  Pt tolerated light exercise without difficulty. VSS, telemetry-Sinus rhythm, asymptomatic.  Medication list reconciled. Pt denies barriers to medicaiton compliance.  PSYCHOSOCIAL ASSESSMENT:  PHQ-2. Pt exhibits positive coping skills, hopeful outlook with supportive family. No psychosocial needs identified at this time, no psychosocial interventions necessary.    Pt enjoys boating, collecting cars, car shows.   Pt oriented  to exercise equipment and routine.    Understanding verbalized.[name] RN BSN    Dr. Armanda Magic is Medical Director for Cardiac Rehab at [facility].

## 2022-08-30 ENCOUNTER — Encounter (HOSPITAL_COMMUNITY): Payer: Medicare Other

## 2022-08-30 ENCOUNTER — Encounter (HOSPITAL_COMMUNITY): Admission: RE | Admit: 2022-08-30 | Payer: Medicare Other | Source: Ambulatory Visit

## 2022-08-30 DIAGNOSIS — Z48812 Encounter for surgical aftercare following surgery on the circulatory system: Secondary | ICD-10-CM | POA: Diagnosis not present

## 2022-08-30 DIAGNOSIS — Z955 Presence of coronary angioplasty implant and graft: Secondary | ICD-10-CM | POA: Diagnosis not present

## 2022-09-01 ENCOUNTER — Other Ambulatory Visit: Payer: Self-pay | Admitting: Internal Medicine

## 2022-09-01 ENCOUNTER — Encounter (HOSPITAL_COMMUNITY): Payer: Medicare Other

## 2022-09-01 ENCOUNTER — Encounter (HOSPITAL_COMMUNITY)
Admission: RE | Admit: 2022-09-01 | Discharge: 2022-09-01 | Disposition: A | Discharge status: Home / Self Care | Payer: Medicare Other | Source: Ambulatory Visit | Attending: Internal Medicine | Admitting: Internal Medicine

## 2022-09-01 DIAGNOSIS — Z48812 Encounter for surgical aftercare following surgery on the circulatory system: Secondary | ICD-10-CM | POA: Diagnosis not present

## 2022-09-01 DIAGNOSIS — Z955 Presence of coronary angioplasty implant and graft: Secondary | ICD-10-CM | POA: Diagnosis not present

## 2022-09-04 ENCOUNTER — Encounter (HOSPITAL_COMMUNITY): Payer: Medicare Other

## 2022-09-04 ENCOUNTER — Encounter (HOSPITAL_COMMUNITY)
Admission: RE | Admit: 2022-09-04 | Discharge: 2022-09-04 | Disposition: A | Discharge status: Home / Self Care | Payer: Medicare Other | Source: Ambulatory Visit | Attending: Internal Medicine | Admitting: Internal Medicine

## 2022-09-04 DIAGNOSIS — Z48812 Encounter for surgical aftercare following surgery on the circulatory system: Secondary | ICD-10-CM | POA: Diagnosis not present

## 2022-09-04 DIAGNOSIS — Z955 Presence of coronary angioplasty implant and graft: Secondary | ICD-10-CM

## 2022-09-04 LAB — GLUCOSE, CAPILLARY: Glucose-Capillary: 107 mg/dL — ABNORMAL HIGH (ref 70–99)

## 2022-09-04 NOTE — Progress Notes (Addendum)
Reported feeling lightheaded and experienced some blurred vision during cool down stretches. Telemetry rhythm Sinus tach 107. Blood pressure 122/70. Ed reports that he ate a protein bar and an english muffin this morning before driving from [place]. Medications reviewed. Taking as prescribed. Given water. CBG 107. Recheck sitting BP 94/60 sitting. Standing BP 104/70 heart rate 100. Symptoms improved given a banana. Onsite provider [name] NP notified. Chad Palmer said to advise Chad Palmer to make sure he hydrates and eats before coming to exercise. Patient states understanding. Patient was given graham crackers peanut butter and drank 12 ounces of water before leaving exercise. Ed left cardiac rehab without symptoms. Exit heart rate 93. [name] RN BSN

## 2022-09-06 ENCOUNTER — Encounter (HOSPITAL_COMMUNITY)
Admission: RE | Admit: 2022-09-06 | Discharge: 2022-09-06 | Disposition: A | Discharge status: Home / Self Care | Payer: Medicare Other | Source: Ambulatory Visit | Attending: Internal Medicine | Admitting: Internal Medicine

## 2022-09-06 ENCOUNTER — Encounter (HOSPITAL_COMMUNITY): Payer: Medicare Other

## 2022-09-06 DIAGNOSIS — Z955 Presence of coronary angioplasty implant and graft: Secondary | ICD-10-CM | POA: Diagnosis not present

## 2022-09-06 DIAGNOSIS — Z48812 Encounter for surgical aftercare following surgery on the circulatory system: Secondary | ICD-10-CM | POA: Diagnosis not present

## 2022-09-06 NOTE — Progress Notes (Signed)
Cardiac Individual Treatment Plan  Patient Details  Name: Chad Palmer MRN: [id number] Date of Birth: 10/27/1945 Referring Provider:   Flowsheet Row INTENSIVE CARDIAC REHAB ORIENT from 08/22/2022 in [facility]  Referring Provider Dietrich Pates, MD       Initial Encounter Date:  Flowsheet Row INTENSIVE CARDIAC REHAB ORIENT from 08/22/2022 in [facility]  Date 08/22/22       Visit Diagnosis: 08/01/22 Status post coronary artery stent placement LAD  Patient's Home Medications on Admission:  Current Outpatient Medications:    aspirin EC (ASPIRIN LOW DOSE) 81 MG tablet, TAKE 1 TABLET BY MOUTH DAILY SWALLOW WHOLE, Disp: 90 tablet, Rfl: 3   atorvastatin (LIPITOR) 40 MG tablet, Take 1 tablet (40 mg total) by mouth daily., Disp: 90 tablet, Rfl: 1   Calcium-Vitamin D-Vitamin K (CALCIUM + D + K PO), Take 1 tablet by mouth daily., Disp: , Rfl:    clopidogrel (PLAVIX) 75 MG tablet, Take 1 tablet (75 mg total) by mouth daily., Disp: 30 tablet, Rfl: 6   Cyanocobalamin (B-12) 2500 MCG TABS, Take 2,500 mcg by mouth daily., Disp: , Rfl:    finasteride (PROSCAR) 5 MG tablet, Take 1 tablet (5 mg total) by mouth daily., Disp: 90 tablet, Rfl: 3   fluticasone (FLONASE) 50 MCG/ACT nasal spray, PLACE 2 SPRAYS INTO BOTH NOSTRILS DAILY, Disp: 16 mL, Rfl: 0   folic acid (FOLVITE) 1 MG tablet, Take 1 tablet (1 mg total) by mouth daily., Disp: 90 tablet, Rfl: 1   Histamine Dihydrochloride (AUSTRALIAN DREAM ARTHRITIS EX), Apply 1 Application topically daily as needed (pain)., Disp: , Rfl:    lidocaine (LIDODERM) 5 %, Place 1 patch onto the skin daily as needed (pain). Remove & Discard patch within 12 hours or as directed by MD, Disp: , Rfl:    Magnesium 250 MG TABS, Take 250 mg by mouth daily., Disp: , Rfl:    nitroGLYCERIN (NITROSTAT) 0.4 MG SL tablet, DISSOLVE 1 TAB UNDER TONGUE FOR CHEST PAIN - IF  PAIN REMAINS AFTER 5 MIN, CALL 911 AND REPEAT DOSE. MAX 3 TABS IN 15 MINUTES, Disp: 25 tablet, Rfl: 0   olmesartan-hydrochlorothiazide (BENICAR HCT) 20-12.5 MG tablet, Take 1 tablet by mouth daily., Disp: , Rfl:    pantoprazole (PROTONIX) 40 MG tablet, Take 1 tablet (40 mg total) by mouth daily., Disp: 30 tablet, Rfl: 1   Probiotic Product (ALIGN) 4 MG CAPS, Take 4 mg by mouth at bedtime., Disp: , Rfl:    sildenafil (VIAGRA) 100 MG tablet, Take 50 mg by mouth daily as needed for erectile dysfunction., Disp: , Rfl:    sulfamethoxazole-trimethoprim (BACTRIM) 400-80 MG tablet, Take 1 tablet by mouth 2 (two) times daily. (Patient not taking: Reported on 08/22/2022), Disp: 14 tablet, Rfl: 0   zolpidem (AMBIEN) 10 MG tablet, Take 1 tablet (10 mg total) by mouth at bedtime., Disp: 90 tablet, Rfl: 1  Past Medical History: Past Medical History:  Diagnosis Date   Allergic rhinitis    Arthritis    BPH (benign prostatic hyperplasia)    Cancer (HCC)    kidney    Cataract    bilateral    Cervical spinal stenosis    Dr Dohmier   Colon polyp    Diverticulosis of colon    ED (erectile dysfunction)    Elevated PSA    GERD (gastroesophageal reflux disease)    Hemorrhoids    HTN (  hypertension)    Microcytosis     Tobacco Use: Social History   Tobacco Use  Smoking Status Never  Smokeless Tobacco Never    Labs: Review Flowsheet  More data exists      Latest Ref Rng & Units 07/02/2018 07/03/2019 02/19/2020 01/26/2021 05/16/2022  Labs for ITP Cardiac and Pulmonary Rehab  Cholestrol 0 - 200 mg/dL 161  096  - 045  409   LDL (calc) 0 - 99 mg/dL 811  914  - 782  956   HDL-C >39.00 mg/dL 21.30  86.57  - 84.69  40.20   Trlycerides 0.0 - 149.0 mg/dL 629.5  28.4  - 132.4  401.0   Hemoglobin A1c 4.6 - 6.5 % - - 5.9  - -    Details            Capillary Blood Glucose: Lab Results  Component Value Date   GLUCAP 107 (H) 09/04/2022   GLUCAP 92 11/21/2019     Exercise Target Goals: Exercise  Program Goal: Individual exercise prescription set using results from initial 6 min walk test and THRR while considering  patient's activity barriers and safety.   Exercise Prescription Goal: Initial exercise prescription builds to 30-45 minutes a day of aerobic activity, 2-3 days per week.  Home exercise guidelines will be given to patient during program as part of exercise prescription that the participant will acknowledge.  Activity Barriers & Risk Stratification:  Activity Barriers & Cardiac Risk Stratification - 08/22/22 1436       Activity Barriers & Cardiac Risk Stratification   Activity Barriers Balance Concerns;Arthritis;Joint Problems;Back Problems;Neck/Spine Problems    Cardiac Risk Stratification High             6 Minute Walk:  6 Minute Walk     Row Name 08/22/22 1439         6 Minute Walk   Phase Initial     Distance 1416 feet     Walk Time 6 minutes     # of Rest Breaks 0     MPH 2.7     METS 2.72     RPE 11     Perceived Dyspnea  0     VO2 Peak 9.52     Symptoms No     Resting HR 71 bpm     Resting BP 116/70     Resting Oxygen Saturation  97 %     Exercise Oxygen Saturation  during 6 min walk 99 %     Max Ex. HR 102 bpm     Max Ex. BP 130/68     2 Minute Post BP 114/66              Oxygen Initial Assessment:   Oxygen Re-Evaluation:   Oxygen Discharge (Final Oxygen Re-Evaluation):   Initial Exercise Prescription:  Initial Exercise Prescription - 08/22/22 1400       Date of Initial Exercise RX and Referring Provider   Date 08/22/22    Referring Provider Dietrich Pates, MD    Expected Discharge Date 11/08/22      Recumbant Elliptical   Level 2    RPM 50    Watts 25    Minutes 15    METs 2.72      Track   Laps 14    Minutes 15    METs 2.72      Prescription Details   Frequency (times per week) 3    Duration Progress to 30 minutes of  continuous aerobic without signs/symptoms of physical distress      Intensity   THRR 40-80%  of Max Heartrate 11/15/22    Ratings of Perceived Exertion 11-13    Perceived Dyspnea 0-4      Progression   Progression Continue progressive overload as per policy without signs/symptoms or physical distress.      Resistance Training   Training Prescription Yes    Weight 3 lbs    Reps 10-15             Perform Capillary Blood Glucose checks as needed.  Exercise Prescription Changes:   Exercise Prescription Changes     Row Name 08/28/22 1642             Response to Exercise   Blood Pressure (Admit) 130/72       Blood Pressure (Exercise) 150/80       Blood Pressure (Exit) 112/70       Heart Rate (Admit) 70 bpm       Heart Rate (Exercise) 119 bpm       Heart Rate (Exit) 93 bpm       Rating of Perceived Exertion (Exercise) 11.5       Perceived Dyspnea (Exercise) 0       Symptoms 0       Comments Pt first day in the P[facility] ICR program       Duration Progress to 30 minutes of  aerobic without signs/symptoms of physical distress       Intensity THRR unchanged         Progression   Progression Continue to progress workloads to maintain intensity without signs/symptoms of physical distress.       Average METs 3.27         Resistance Training   Training Prescription Yes       Weight 3 lbs       Reps 10-15       Time 10 Minutes         Recumbant Elliptical   Level 2       RPM 5.7       Watts 79       Minutes 15       METs 3.5         Track   Laps 16       Minutes 15       METs 3.04                Exercise Comments:   Exercise Comments     Row Name 08/28/22 1647           Exercise Comments Pt first day in the P[facility] ICR program. Pt tolerated exercise well with an average MET level of 3.27. Pt is learning his THRR, RPE and ExRx. Pt is off to a great start. Will continue to monitor pt and progress workloads as tolerated without sign or symptom                Exercise Goals and Review:   Exercise Goals     Row Name 08/22/22 1322              Exercise Goals   Increase Physical Activity Yes       Intervention Provide advice, education, support and counseling about physical activity/exercise needs.;Develop an individualized exercise prescription for aerobic and resistive training based on initial evaluation findings, risk stratification, comorbidities and participant's personal goals.       Expected Outcomes Short Term:  Attend rehab on a regular basis to increase amount of physical activity.;Long Term: Exercising regularly at least 3-5 days a week.;Long Term: Add in home exercise to make exercise part of routine and to increase amount of physical activity.       Increase Strength and Stamina Yes       Intervention Provide advice, education, support and counseling about physical activity/exercise needs.;Develop an individualized exercise prescription for aerobic and resistive training based on initial evaluation findings, risk stratification, comorbidities and participant's personal goals.       Expected Outcomes Short Term: Increase workloads from initial exercise prescription for resistance, speed, and METs.;Short Term: Perform resistance training exercises routinely during rehab and add in resistance training at home;Long Term: Improve cardiorespiratory fitness, muscular endurance and strength as measured by increased METs and functional capacity ( )       Able to understand and use rate of perceived exertion (RPE) scale Yes       Intervention Provide education and explanation on how to use RPE scale       Expected Outcomes Short Term: Able to use RPE daily in rehab to express subjective intensity level;Long Term:  Able to use RPE to guide intensity level when exercising independently       Knowledge and understanding of Target Heart Rate Range (THRR) Yes       Expected Outcomes Short Term: Able to state/look up THRR;Long Term: Able to use THRR to govern intensity when exercising independently;Short Term: Able to use daily as  guideline for intensity in rehab       Understanding of Exercise Prescription Yes       Intervention Provide education, explanation, and written materials on patient's individual exercise prescription       Expected Outcomes Short Term: Able to explain program exercise prescription;Long Term: Able to explain home exercise prescription to exercise independently                Exercise Goals Re-Evaluation :  Exercise Goals Re-Evaluation     Row Name 08/28/22 1646             Exercise Goal Re-Evaluation   Exercise Goals Review Increase Physical Activity;Understanding of Exercise Prescription;Increase Strength and Stamina;Knowledge and understanding of Target Heart Rate Range (THRR);Able to understand and use rate of perceived exertion (RPE) scale       Comments Pt first day in the [facility] ICR program. Pt tolerated exercise well with an average MET level of 3.27. Pt is learning his THRR, RPE and ExRx. Pt is off to a great start.       Expected Outcomes Will continue to monitor pt and progress workloads as tolerated without sign or symptom                Discharge Exercise Prescription (Final Exercise Prescription Changes):  Exercise Prescription Changes - 08/28/22 1642       Response to Exercise   Blood Pressure (Admit) 130/72    Blood Pressure (Exercise) 150/80    Blood Pressure (Exit) 112/70    Heart Rate (Admit) 70 bpm    Heart Rate (Exercise) 119 bpm    Heart Rate (Exit) 93 bpm    Rating of Perceived Exertion (Exercise) 11.5    Perceived Dyspnea (Exercise) 0    Symptoms 0    Comments Pt first day in the [facility] ICR program    Duration Progress to 30 minutes of  aerobic without signs/symptoms of physical distress    Intensity THRR unchanged  Progression   Progression Continue to progress workloads to maintain intensity without signs/symptoms of physical distress.    Average METs 3.27      Resistance Training   Training Prescription Yes    Weight 3 lbs     Reps 10-15    Time 10 Minutes      Recumbant Elliptical   Level 2    RPM 5.7    Watts 79    Minutes 15    METs 3.5      Track   Laps 16    Minutes 15    METs 3.04             Nutrition:  Target Goals: Understanding of nutrition guidelines, daily intake of sodium 1500mg , cholesterol 200mg , calories 30% from fat and 7% or less from saturated fats, daily to have 5 or more servings of fruits and vegetables.  Biometrics:  Pre Biometrics - 08/22/22 1330       Pre Biometrics   % Body Fat 27 %             Post Biometrics - 08/22/22 1330        Post  Biometrics   Waist Circumference 38 inches    Hip Circumference 41 inches    Waist to Hip Ratio 0.93 %    Triceps Skinfold 16 mm    Grip Strength 34 kg    Flexibility 0 in   Pt could not reach box   Single Leg Stand 6.8 seconds             Nutrition Therapy Plan and Nutrition Goals:  Nutrition Therapy & Goals - 08/28/22 1538       Nutrition Therapy   Diet Heart Healthy Diet    Drug/Food Interactions Statins/Certain Fruits      Personal Nutrition Goals   Nutrition Goal Patient to identify strategies for reducing cardiovascular risk by attending the [facility] education and nutrition series weekly.    Personal Goal #2 Patient to improve diet quality by using the plate method as a guide for meal planning to include lean protein/plant protein, fruits, vegetables, whole grains, nonfat dairy as part of a well-balanced diet.    Comments Patient will benefit from participation in intensive cardiac rehab for nutrition, exercise, and lifestyle modification.      Intervention Plan   Intervention Prescribe, educate and counsel regarding individualized specific dietary modifications aiming towards targeted core components such as weight, hypertension, lipid management, diabetes, heart failure and other comorbidities.;Nutrition handout(s) given to patient.    Expected Outcomes Long Term Goal: Adherence to prescribed  nutrition plan.;Short Term Goal: Understand basic principles of dietary content, such as calories, fat, sodium, cholesterol and nutrients.             Nutrition Assessments:  Nutrition Assessments - 08/29/22 0929       Rate Your Plate Scores   Pre Score 58            MEDIFICTS Score Key: ?70 Need to make dietary changes  40-70 Heart Healthy Diet ? 40 Therapeutic Level Cholesterol Diet   Flowsheet Row INTENSIVE CARDIAC REHAB from 08/28/2022 in [facility]  Picture Your Plate Total Score on Admission 58      Picture Your Plate Scores: <13 Unhealthy dietary pattern with much room for improvement. 41-50 Dietary pattern unlikely to meet recommendations for good health and room for improvement. 51-60 More healthful dietary pattern, with some room for improvement.  >  60 Healthy dietary pattern, although there may be some specific behaviors that could be improved.    Nutrition Goals Re-Evaluation:  Nutrition Goals Re-Evaluation     Row Name 08/28/22 1538             Goals   Current Weight 181 lb 10.5 oz (82.4 kg)       Comment Cholesterole 201, LDL 131       Expected Outcome Patient will benefit from participation in intensive cardiac rehab for nutrition, exercise, and lifestyle modification.                Nutrition Goals Re-Evaluation:  Nutrition Goals Re-Evaluation     Row Name 08/28/22 1538             Goals   Current Weight 181 lb 10.5 oz (82.4 kg)       Comment Cholesterole 201, LDL 131       Expected Outcome Patient will benefit from participation in intensive cardiac rehab for nutrition, exercise, and lifestyle modification.                Nutrition Goals Discharge (Final Nutrition Goals Re-Evaluation):  Nutrition Goals Re-Evaluation - 08/28/22 1538       Goals   Current Weight 181 lb 10.5 oz (82.4 kg)    Comment Cholesterole 201, LDL 131    Expected Outcome Patient will  benefit from participation in intensive cardiac rehab for nutrition, exercise, and lifestyle modification.             Psychosocial: Target Goals: Acknowledge presence or absence of significant depression and/or stress, maximize coping skills, provide positive support system. Participant is able to verbalize types and ability to use techniques and skills needed for reducing stress and depression.  Initial Review & Psychosocial Screening:  Initial Psych Review & Screening - 08/22/22 1437       Initial Review   Current issues with None Identified      Family Dynamics   Good Support System? Yes   Pt has spouse for support     Barriers   Psychosocial barriers to participate in program There are no identifiable barriers or psychosocial needs.      Screening Interventions   Interventions Encouraged to exercise             Quality of Life Scores:  Quality of Life - 08/22/22 1513       Quality of Life   Select Quality of Life      Quality of Life Scores   Health/Function Pre 26.73 %    Socioeconomic Pre 30 %    Psych/Spiritual Pre 30 %    Family Pre 30 %    GLOBAL Pre 28.52 %            Scores of 19 and below usually indicate a poorer quality of life in these areas.  A difference of  2-3 points is a clinically meaningful difference.  A difference of 2-3 points in the total score of the Quality of Life Index has been associated with significant improvement in overall quality of life, self-image, physical symptoms, and general health in studies assessing change in quality of life.  PHQ-9: Review Flowsheet  More data exists      08/22/2022 08/04/2022 05/16/2022 11/09/2021 04/13/2021  Depression screen PHQ 2/9  Decreased Interest 0 0 0 0 0  Down, Depressed, Hopeless 0 0 0 0 0  PHQ - 2 Score 0 0 0 0 0  Altered sleeping 1 - - - -  Tired, decreased energy 1 - - - -  Change in appetite 0 - - - -  Feeling bad or failure about yourself  0 - - - -  Trouble concentrating 0 - -  - -  Moving slowly or fidgety/restless 0 - - - -  Suicidal thoughts 0 - - - -  PHQ-9 Score 2 - - - -  Difficult doing work/chores Somewhat difficult - - - -    Details           Interpretation of Total Score  Total Score Depression Severity:  1-4 = Minimal depression, 5-9 = Mild depression, 10-14 = Moderate depression, 15-19 = Moderately severe depression, 20-27 = Severe depression   Psychosocial Evaluation and Intervention:   Psychosocial Re-Evaluation:  Psychosocial Re-Evaluation     Row Name 08/29/22 1213 09/06/22 1401           Psychosocial Re-Evaluation   Current issues with Current Stress Concerns Current Stress Concerns      Comments Ed started cardiac rehab on 08/28/22. Ed did say he was nervous to do any exercise IE walking outsite of cardiac rehab because he did not know what he can and cannot do. Emotional support provided Ed started cardiac rehab on 08/28/22. Ed has not not voiced any increase concerns during exercise so far.      Expected Outcomes Ed will have decreased or controlled stress upon completion of intensive cardiac rehab. Ed will have decreased or controlled stress upon completion of intensive cardiac rehab.      Interventions Encouraged to attend Cardiac Rehabilitation for the exercise Encouraged to attend Cardiac Rehabilitation for the exercise      Continue Psychosocial Services  Follow up required by staff Follow up required by staff        Initial Review   Source of Stress Concerns Chronic Illness Chronic Illness      Comments Will continue to montior and offer support as needed Will continue to montior and offer support as needed               Psychosocial Discharge (Final Psychosocial Re-Evaluation):  Psychosocial Re-Evaluation - 09/06/22 1401       Psychosocial Re-Evaluation   Current issues with Current Stress Concerns    Comments Ed started cardiac rehab on 08/28/22. Ed has not not voiced any increase concerns during exercise so  far.    Expected Outcomes Ed will have decreased or controlled stress upon completion of intensive cardiac rehab.    Interventions Encouraged to attend Cardiac Rehabilitation for the exercise    Continue Psychosocial Services  Follow up required by staff      Initial Review   Source of Stress Concerns Chronic Illness    Comments Will continue to montior and offer support as needed             Vocational Rehabilitation: Provide vocational rehab assistance to qualifying candidates.   Vocational Rehab Evaluation & Intervention:  Vocational Rehab - 08/22/22 1438       Initial Vocational Rehab Evaluation & Intervention   Assessment shows need for Vocational Rehabilitation No   Pt is retired, no vocational needs            Education: Education Goals: Education classes will be provided on a weekly basis, covering required topics. Participant will state understanding/return demonstration of topics presented.    Education     Row Name 08/28/22 1500     Education   Cardiac Education Topics Pritikin   Select  Core Videos     Core Videos   Educator Dietitian   Select Nutrition   Nutrition Facts on Fat   Instruction Review Code 1- Verbalizes Understanding   Class Start Time 1400   Class Stop Time 1440   Class Time Calculation (min) 40 min    Row Name 08/30/22 1500     Education   Cardiac Education Topics Prit[facility]in   Customer service manager   Weekly Topic Fast Evening Meals   Instruction Review Code 1- Verbalizes Understanding   Class Start Time 1400   Class Stop Time 1440   Class Time Calculation (min) 40 min    Row Name 09/01/22 1500     Education   Cardiac Education Topics Prit[facility]in   Licensed conveyancer Nutrition   Nutrition Vitamins and Minerals   Instruction Review Code 1- Verbalizes Understanding   Class Start Time 1400   Class Stop Time 1445   Class Time Calculation  (min) 45 min    Row Name 09/04/22 1600     Education   Cardiac Education Topics Pritikin   Geographical information systems officer Psychosocial   Psychosocial Workshop Healthy Sleep for a Healthy Heart   Instruction Review Code 1- Verbalizes Understanding   Class Start Time 1400   Class Stop Time 1455   Class Time Calculation (min) 55 min            Core Videos: Exercise    Move It!  Clinical staff conducted group or individual video education with verbal and written material and guidebook.  Patient learns the recommended Pritikin exercise program. Exercise with the goal of living a long, healthy life. Some of the health benefits of exercise include controlled diabetes, healthier blood pressure levels, improved cholesterol levels, improved heart and lung capacity, improved sleep, and better body composition. Everyone should speak with their doctor before starting or changing an exercise routine.  Biomechanical Limitations Clinical staff conducted group or individual video education with verbal and written material and guidebook.  Patient learns how biomechanical limitations can impact exercise and how we can mitigate and possibly overcome limitations to have an impactful and balanced exercise routine.  Body Composition Clinical staff conducted group or individual video education with verbal and written material and guidebook.  Patient learns that body composition (ratio of muscle mass to fat mass) is a key component to assessing overall fitness, rather than body weight alone. Increased fat mass, especially visceral belly fat, can put [place] at increased risk for metabolic syndrome, type 2 diabetes, heart disease, and even death. It is recommended to combine diet and exercise (cardiovascular and resistance training) to improve your body composition. Seek guidance from your physician and exercise physiologist before implementing an exercise  routine.  Exercise Action Plan Clinical staff conducted group or individual video education with verbal and written material and guidebook.  Patient learns the recommended strategies to achieve and enjoy long-term exercise adherence, including variety, self-motivation, self-efficacy, and positive decision making. Benefits of exercise include fitness, good health, weight management, more energy, better sleep, less stress, and overall well-being.  Medical   Heart Disease Risk Reduction Clinical staff conducted group or individual video education with verbal and written material and guidebook.  Patient learns our heart is our most vital organ as it circulates oxygen, nutrients, white blood cells,  and hormones throughout the entire body, and carries waste away. Data supports a plant-based eating plan like the Pritikin Program for its effectiveness in slowing progression of and reversing heart disease. The video provides a number of recommendations to address heart disease.   Metabolic Syndrome and Belly Fat  Clinical staff conducted group or individual video education with verbal and written material and guidebook.  Patient learns what metabolic syndrome is, how it leads to heart disease, and how one can reverse it and keep it from coming back. You have metabolic syndrome if you have 3 of the following 5 criteria: abdominal obesity, high blood pressure, high triglycerides, low HDL cholesterol, and high blood sugar.  Hypertension and Heart Disease Clinical staff conducted group or individual video education with verbal and written material and guidebook.  Patient learns that high blood pressure, or hypertension, is very common in the [place]. Hypertension is largely due to excessive salt intake, but other important risk factors include being overweight, physical inactivity, drinking too much alcohol, smoking, and not eating enough potassium from fruits and vegetables. High blood pressure is a  leading risk factor for heart attack, stroke, congestive heart failure, dementia, kidney failure, and premature death. Long-term effects of excessive salt intake include stiffening of the arteries and thickening of heart muscle and organ damage. Recommendations include ways to reduce hypertension and the risk of heart disease.  Diseases of Our Time - Focusing on Diabetes Clinical staff conducted group or individual video education with verbal and written material and guidebook.  Patient learns why the best way to stop diseases of our time is prevention, through food and other lifestyle changes. Medicine (such as prescription pills and surgeries) is often only a Band-Aid on the problem, not a long-term solution. Most common diseases of our time include obesity, type 2 diabetes, hypertension, heart disease, and cancer. The Pritikin Program is recommended and has been proven to help reduce, reverse, and/or prevent the damaging effects of metabolic syndrome.  Nutrition   Overview of the Pritikin Eating Plan  Clinical staff conducted group or individual video education with verbal and written material and guidebook.  Patient learns about the Pritikin Eating Plan for disease risk reduction. The Pritikin Eating Plan emphasizes a wide variety of unrefined, minimally-processed carbohydrates, like fruits, vegetables, whole grains, and legumes. Go, Caution, and Stop food choices are explained. Plant-based and lean animal proteins are emphasized. Rationale provided for low sodium intake for blood pressure control, low added sugars for blood sugar stabilization, and low added fats and oils for coronary artery disease risk reduction and weight management.  Calorie Density  Clinical staff conducted group or individual video education with verbal and written material and guidebook.  Patient learns about calorie density and how it impacts the Pritikin Eating Plan. Knowing the characteristics of the food you choose will  help you decide whether those foods will lead to weight gain or weight loss, and whether you want to consume more or less of them. Weight loss is usually a side effect of the Pritikin Eating Plan because of its focus on low calorie-dense foods.  Label Reading  Clinical staff conducted group or individual video education with verbal and written material and guidebook.  Patient learns about the Pritikin recommended label reading guidelines and corresponding recommendations regarding calorie density, added sugars, sodium content, and whole grains.  Dining Out - Part 1  Clinical staff conducted group or individual video education with verbal and written material and guidebook.  Patient learns that restaurant  meals can be sabotaging because they can be so high in calories, fat, sodium, and/or sugar. Patient learns recommended strategies on how to positively address this and avoid unhealthy pitfalls.  Facts on Fats  Clinical staff conducted group or individual video education with verbal and written material and guidebook.  Patient learns that lifestyle modifications can be just as effective, if not more so, as many medications for lowering your risk of heart disease. A Pritikin lifestyle can help to reduce your risk of inflammation and atherosclerosis (cholesterol build-up, or plaque, in the artery walls). Lifestyle interventions such as dietary choices and physical activity address the cause of atherosclerosis. A review of the types of fats and their impact on blood cholesterol levels, along with dietary recommendations to reduce fat intake is also included.  Nutrition Action Plan  Clinical staff conducted group or individual video education with verbal and written material and guidebook.  Patient learns how to incorporate Pritikin recommendations into their lifestyle. Recommendations include planning and keeping personal health goals in mind as an important part of their success.  Healthy Mind-Set     Healthy Minds, Bodies, Hearts  Clinical staff conducted group or individual video education with verbal and written material and guidebook.  Patient learns how to identify when they are stressed. Video will discuss the impact of that stress, as well as the many benefits of stress management. Patient will also be introduced to stress management techniques. The way we think, act, and feel has an impact on our hearts.  How Our Thoughts Can Heal Our Hearts  Clinical staff conducted group or individual video education with verbal and written material and guidebook.  Patient learns that negative thoughts can cause depression and anxiety. This can result in negative lifestyle behavior and serious health problems. Cognitive behavioral therapy is an effective method to help control our thoughts in order to change and improve our emotional outlook.  Additional Videos:  Exercise    Improving Performance  Clinical staff conducted group or individual video education with verbal and written material and guidebook.  Patient learns to use a non-linear approach by alternating intensity levels and lengths of time spent exercising to help burn more calories and lose more body fat. Cardiovascular exercise helps improve heart health, metabolism, hormonal balance, blood sugar control, and recovery from fatigue. Resistance training improves strength, endurance, balance, coordination, reaction time, metabolism, and muscle mass. Flexibility exercise improves circulation, posture, and balance. Seek guidance from your physician and exercise physiologist before implementing an exercise routine and learn your capabilities and proper form for all exercise.  Introduction to Yoga  Clinical staff conducted group or individual video education with verbal and written material and guidebook.  Patient learns about yoga, a discipline of the coming together of mind, breath, and body. The benefits of yoga include improved flexibility,  improved range of motion, better posture and core strength, increased lung function, weight loss, and positive self-image. Yoga's heart health benefits include lowered blood pressure, healthier heart rate, decreased cholesterol and triglyceride levels, improved immune function, and reduced stress. Seek guidance from your physician and exercise physiologist before implementing an exercise routine and learn your capabilities and proper form for all exercise.  Medical   Aging: Enhancing Your Quality of Life  Clinical staff conducted group or individual video education with verbal and written material and guidebook.  Patient learns key strategies and recommendations to stay in good physical health and enhance quality of life, such as prevention strategies, having an advocate, securing a Health Care  Proxy and Power of Attorney, and keeping a list of medications and system for tracking them. It also discusses how to avoid risk for bone loss.  Biology of Weight Control  Clinical staff conducted group or individual video education with verbal and written material and guidebook.  Patient learns that weight gain occurs because we consume more calories than we burn (eating more, moving less). Even if your body weight is normal, you may have higher ratios of fat compared to muscle mass. Too much body fat puts you at increased risk for cardiovascular disease, heart attack, stroke, type 2 diabetes, and obesity-related cancers. In addition to exercise, following the Pritikin Eating Plan can help reduce your risk.  Decoding Lab Results  Clinical staff conducted group or individual video education with verbal and written material and guidebook.  Patient learns that lab test reflects one measurement whose values change over time and are influenced by many factors, including medication, stress, sleep, exercise, food, hydration, pre-existing medical conditions, and more. It is recommended to use the knowledge from this  video to become more involved with your lab results and evaluate your numbers to speak with your doctor.   Diseases of Our Time - Overview  Clinical staff conducted group or individual video education with verbal and written material and guidebook.  Patient learns that according to the CDC, 50% to 70% of chronic diseases (such as obesity, type 2 diabetes, elevated lipids, hypertension, and heart disease) are avoidable through lifestyle improvements including healthier food choices, listening to satiety cues, and increased physical activity.  Sleep Disorders Clinical staff conducted group or individual video education with verbal and written material and guidebook.  Patient learns how good quality and duration of sleep are important to overall health and well-being. Patient also learns about sleep disorders and how they impact health along with recommendations to address them, including discussing with a physician.  Nutrition  Dining Out - Part 2 Clinical staff conducted group or individual video education with verbal and written material and guidebook.  Patient learns how to plan ahead and communicate in order to maximize their dining experience in a healthy and nutritious manner. Included are recommended food choices based on the type of restaurant the patient is visiting.   Fueling a Banker conducted group or individual video education with verbal and written material and guidebook.  There is a strong connection between our food choices and our health. Diseases like obesity and type 2 diabetes are very prevalent and are in large-part due to lifestyle choices. The Pritikin Eating Plan provides plenty of food and hunger-curbing satisfaction. It is easy to follow, affordable, and helps reduce health risks.  Menu Workshop  Clinical staff conducted group or individual video education with verbal and written material and guidebook.  Patient learns that restaurant meals can  sabotage health goals because they are often packed with calories, fat, sodium, and sugar. Recommendations include strategies to plan ahead and to communicate with the manager, chef, or server to help order a healthier meal.  Planning Your Eating Strategy  Clinical staff conducted group or individual video education with verbal and written material and guidebook.  Patient learns about the Pritikin Eating Plan and its benefit of reducing the risk of disease. The Pritikin Eating Plan does not focus on calories. Instead, it emphasizes high-quality, nutrient-rich foods. By knowing the characteristics of the foods, we choose, we can determine their calorie density and make informed decisions.  Targeting Your Nutrition Priorities  Clinical staff  conducted group or individual video education with verbal and written material and guidebook.  Patient learns that lifestyle habits have a tremendous impact on disease risk and progression. This video provides eating and physical activity recommendations based on your personal health goals, such as reducing LDL cholesterol, losing weight, preventing or controlling type 2 diabetes, and reducing high blood pressure.  Vitamins and Minerals  Clinical staff conducted group or individual video education with verbal and written material and guidebook.  Patient learns different ways to obtain key vitamins and minerals, including through a recommended healthy diet. It is important to discuss all supplements you take with your doctor.   Healthy Mind-Set    Smoking Cessation  Clinical staff conducted group or individual video education with verbal and written material and guidebook.  Patient learns that cigarette smoking and tobacco addiction pose a serious health risk which affects millions of people. Stopping smoking will significantly reduce the risk of heart disease, lung disease, and many forms of cancer. Recommended strategies for quitting are covered, including  working with your doctor to develop a successful plan.  Culinary   Becoming a Set designer conducted group or individual video education with verbal and written material and guidebook.  Patient learns that cooking at home can be healthy, cost-effective, quick, and puts them in control. Keys to cooking healthy recipes will include looking at your recipe, assessing your equipment needs, planning ahead, making it simple, choosing cost-effective seasonal ingredients, and limiting the use of added fats, salts, and sugars.  Cooking - Breakfast and Snacks  Clinical staff conducted group or individual video education with verbal and written material and guidebook.  Patient learns how important breakfast is to satiety and nutrition through the entire day. Recommendations include key foods to eat during breakfast to help stabilize blood sugar levels and to prevent overeating at meals later in the day. Planning ahead is also a key component.  Cooking - Educational psychologist conducted group or individual video education with verbal and written material and guidebook.  Patient learns eating strategies to improve overall health, including an approach to cook more at home. Recommendations include thinking of animal protein as a side on your plate rather than center stage and focusing instead on lower calorie dense options like vegetables, fruits, whole grains, and plant-based proteins, such as beans. Making sauces in large quantities to freeze for later and leaving the skin on your vegetables are also recommended to maximize your experience.  Cooking - Healthy Salads and Dressing Clinical staff conducted group or individual video education with verbal and written material and guidebook.  Patient learns that vegetables, fruits, whole grains, and legumes are the foundations of the Pritikin Eating Plan. Recommendations include how to incorporate each of these in flavorful and healthy  salads, and how to create homemade salad dressings. Proper handling of ingredients is also covered. Cooking - Soups and [employer] - Soups and Desserts Clinical staff conducted group or individual video education with verbal and written material and guidebook.  Patient learns that Pritikin soups and desserts make for easy, nutritious, and delicious snacks and meal components that are low in sodium, fat, sugar, and calorie density, while high in vitamins, minerals, and filling fiber. Recommendations include simple and healthy ideas for soups and desserts.   Overview     The Pritikin Solution Program Overview Clinical staff conducted group or individual video education with verbal and written material and guidebook.  Patient learns that the results  of the Pritikin Program have been documented in more than 100 articles published in peer-reviewed journals, and the benefits include reducing risk factors for (and, in some cases, even reversing) high cholesterol, high blood pressure, type 2 diabetes, obesity, and more! An overview of the three key pillars of the Pritikin Program will be covered: eating well, doing regular exercise, and having a healthy mind-set.  WORKSHOPS  Exercise: Exercise Basics: Building Your Action Plan Clinical staff led group instruction and group discussion with PowerPoint presentation and patient guidebook. To enhance the learning environment the use of posters, models and videos may be added. At the conclusion of this workshop, patients will comprehend the difference between physical activity and exercise, as well as the benefits of incorporating both, into their routine. Patients will understand the FITT (Frequency, Intensity, Time, and Type) principle and how to use it to build an exercise action plan. In addition, safety concerns and other considerations for exercise and cardiac rehab will be addressed by the presenter. The purpose of this lesson is to promote a  comprehensive and effective weekly exercise routine in order to improve patients' overall level of fitness.   Managing Heart Disease: Your Path to a Healthier Heart Clinical staff led group instruction and group discussion with PowerPoint presentation and patient guidebook. To enhance the learning environment the use of posters, models and videos may be added.At the conclusion of this workshop, patients will understand the anatomy and physiology of the heart. Additionally, they will understand how Pritikin's three pillars impact the risk factors, the progression, and the management of heart disease.  The purpose of this lesson is to provide a high-level overview of the heart, heart disease, and how the Pritikin lifestyle positively impacts risk factors.  Exercise Biomechanics Clinical staff led group instruction and group discussion with PowerPoint presentation and patient guidebook. To enhance the learning environment the use of posters, models and videos may be added. Patients will learn how the structural parts of their bodies function and how these functions impact their daily activities, movement, and exercise. Patients will learn how to promote a neutral spine, learn how to manage pain, and identify ways to improve their physical movement in order to promote healthy living. The purpose of this lesson is to expose patients to common physical limitations that impact physical activity. Participants will learn practical ways to adapt and manage aches and pains, and to minimize their effect on regular exercise. Patients will learn how to maintain good posture while sitting, walking, and lifting.  Balance Training and Fall Prevention  Clinical staff led group instruction and group discussion with PowerPoint presentation and patient guidebook. To enhance the learning environment the use of posters, models and videos may be added. At the conclusion of this workshop, patients will understand the  importance of their sensorimotor skills (vision, proprioception, and the vestibular system) in maintaining their ability to balance as they age. Patients will apply a variety of balancing exercises that are appropriate for their current level of function. Patients will understand the common causes for poor balance, possible solutions to these problems, and ways to modify their physical environment in order to minimize their fall risk. The purpose of this lesson is to teach patients about the importance of maintaining balance as they age and ways to minimize their risk of falling.  WORKSHOPS   Nutrition:  Fueling a Ship broker led group instruction and group discussion with PowerPoint presentation and patient guidebook. To enhance the learning environment the use of posters,  models and videos may be added. Patients will review the foundational principles of the Pritikin Eating Plan and understand what constitutes a serving size in each of the food groups. Patients will also learn Pritikin-friendly foods that are better choices when away from home and review make-ahead meal and snack options. Calorie density will be reviewed and applied to three nutrition priorities: weight maintenance, weight loss, and weight gain. The purpose of this lesson is to reinforce (in a group setting) the key concepts around what patients are recommended to eat and how to apply these guidelines when away from home by planning and selecting Pritikin-friendly options. Patients will understand how calorie density may be adjusted for different weight management goals.  Mindful Eating  Clinical staff led group instruction and group discussion with PowerPoint presentation and patient guidebook. To enhance the learning environment the use of posters, models and videos may be added. Patients will briefly review the concepts of the Pritikin Eating Plan and the importance of low-calorie dense foods. The concept of mindful  eating will be introduced as well as the importance of paying attention to internal hunger signals. Triggers for non-hunger eating and techniques for dealing with triggers will be explored. The purpose of this lesson is to provide patients with the opportunity to review the basic principles of the Pritikin Eating Plan, discuss the value of eating mindfully and how to measure internal cues of hunger and fullness using the Hunger Scale. Patients will also discuss reasons for non-hunger eating and learn strategies to use for controlling emotional eating.  Targeting Your Nutrition Priorities Clinical staff led group instruction and group discussion with PowerPoint presentation and patient guidebook. To enhance the learning environment the use of posters, models and videos may be added. Patients will learn how to determine their genetic susceptibility to disease by reviewing their family history. Patients will gain insight into the importance of diet as part of an overall healthy lifestyle in mitigating the impact of genetics and other environmental insults. The purpose of this lesson is to provide patients with the opportunity to assess their personal nutrition priorities by looking at their family history, their own health history and current risk factors. Patients will also be able to discuss ways of prioritizing and modifying the Pritikin Eating Plan for their highest risk areas  Menu  Clinical staff led group instruction and group discussion with PowerPoint presentation and patient guidebook. To enhance the learning environment the use of posters, models and videos may be added. Using menus brought in from [employer], or printed from [employer], patients will apply the Pritikin dining out guidelines that were presented in the [employer] video. Patients will also be able to practice these guidelines in a variety of provided scenarios. The purpose of this lesson is to provide  patients with the opportunity to practice hands-on learning of the Pritikin Dining Out guidelines with actual menus and practice scenarios.  Label Reading Clinical staff led group instruction and group discussion with PowerPoint presentation and patient guidebook. To enhance the learning environment the use of posters, models and videos may be added. Patients will review and discuss the Pritikin label reading guidelines presented in Pritikin's Label Reading Educational series video. Using fool labels brought in from local grocery stores and markets, patients will apply the label reading guidelines and determine if the packaged food meet the Pritikin guidelines. The purpose of this lesson is to provide patients with the opportunity to review, discuss, and practice hands-on learning of the Pritikin  Label Reading guidelines with actual packaged food labels. Cooking School  Pritikin's [employer] are designed to teach patients ways to prepare quick, simple, and affordable recipes at home. The importance of nutrition's role in chronic disease risk reduction is reflected in its emphasis in the overall Pritikin program. By learning how to prepare essential core Pritikin Eating Plan recipes, patients will increase control over what they eat; be able to customize the flavor of foods without the use of added salt, sugar, or fat; and improve the quality of the food they consume. By learning a set of core recipes which are easily assembled, quickly prepared, and affordable, patients are more likely to prepare more healthy foods at home. These workshops focus on convenient breakfasts, simple entres, side dishes, and desserts which can be prepared with minimal effort and are consistent with nutrition recommendations for cardiovascular risk reduction. Cooking [employer] are taught by a Armed forces logistics/support/administrative officer (RD) who has been trained by the [employer]. The chef or RD has a clear  understanding of the importance of minimizing - if not completely eliminating - added fat, sugar, and sodium in recipes. Throughout the series of Cooking School Workshop sessions, patients will learn about healthy ingredients and efficient methods of cooking to build confidence in their capability to prepare    Cooking School weekly topics:  Adding Flavor- Sodium-Free  Fast and Healthy Breakfasts  Powerhouse Plant-Based Proteins  Satisfying Salads and Dressings  Simple Sides and Sauces  International Cuisine-Spotlight on the [employer] Zones  Delicious Desserts  Savory Soups  [employer] - Meals in a Astronomer Appetizers and Snacks  Comforting Weekend Breakfasts  One-Pot Wonders   Fast Evening Meals  Landscape architect Your Pritikin Plate  WORKSHOPS   Healthy Mindset (Psychosocial):  Focused Goals, Sustainable Changes Clinical staff led group instruction and group discussion with PowerPoint presentation and patient guidebook. To enhance the learning environment the use of posters, models and videos may be added. Patients will be able to apply effective goal setting strategies to establish at least one personal goal, and then take consistent, meaningful action toward that goal. They will learn to identify common barriers to achieving personal goals and develop strategies to overcome them. Patients will also gain an understanding of how our mind-set can impact our ability to achieve goals and the importance of cultivating a positive and growth-oriented mind-set. The purpose of this lesson is to provide patients with a deeper understanding of how to set and achieve personal goals, as well as the tools and strategies needed to overcome common obstacles which may arise along the way.  From Head to Heart: The Power of a Healthy Outlook  Clinical staff led group instruction and group discussion with PowerPoint presentation and patient guidebook. To enhance the learning  environment the use of posters, models and videos may be added. Patients will be able to recognize and describe the impact of emotions and mood on physical health. They will discover the importance of self-care and explore self-care practices which may work for them. Patients will also learn how to utilize the 4 C's to cultivate a healthier outlook and better manage stress and challenges. The purpose of this lesson is to demonstrate to patients how a healthy outlook is an essential part of maintaining good health, especially as they continue their cardiac rehab journey.  Healthy Sleep for a Healthy Heart Clinical staff led group instruction and group discussion with PowerPoint presentation and patient guidebook. To  enhance the learning environment the use of posters, models and videos may be added. At the conclusion of this workshop, patients will be able to demonstrate knowledge of the importance of sleep to overall health, well-being, and quality of life. They will understand the symptoms of, and treatments for, common sleep disorders. Patients will also be able to identify daytime and nighttime behaviors which impact sleep, and they will be able to apply these tools to help manage sleep-related challenges. The purpose of this lesson is to provide patients with a general overview of sleep and outline the importance of quality sleep. Patients will learn about a few of the most common sleep disorders. Patients will also be introduced to the concept of "sleep hygiene," and discover ways to self-manage certain sleeping problems through simple daily behavior changes. Finally, the workshop will motivate patients by clarifying the links between quality sleep and their goals of heart-healthy living.   Recognizing and Reducing Stress Clinical staff led group instruction and group discussion with PowerPoint presentation and patient guidebook. To enhance the learning environment the use of posters, models and videos  may be added. At the conclusion of this workshop, patients will be able to understand the types of stress reactions, differentiate between acute and chronic stress, and recognize the impact that chronic stress has on their health. They will also be able to apply different coping mechanisms, such as reframing negative self-talk. Patients will have the opportunity to practice a variety of stress management techniques, such as deep abdominal breathing, progressive muscle relaxation, and/or guided imagery.  The purpose of this lesson is to educate patients on the role of stress in their lives and to provide healthy techniques for coping with it.  Learning Barriers/Preferences:  Learning Barriers/Preferences - 08/22/22 1438       Learning Barriers/Preferences   Learning Barriers Sight    Learning Preferences Audio;Computer/Internet;Group Instruction;Individual Instruction;Pictoral;Skilled Demonstration;Verbal Instruction;Video;Written Material             Education Topics:  Knowledge Questionnaire Score:  Knowledge Questionnaire Score - 08/22/22 1514       Knowledge Questionnaire Score   Pre Score 20/24             Core Components/Risk Factors/Patient Goals at Admission:  Personal Goals and Risk Factors at Admission - 08/22/22 1438       Core Components/Risk Factors/Patient Goals on Admission   Hypertension Yes    Intervention Provide education on lifestyle modifcations including regular physical activity/exercise, weight management, moderate sodium restriction and increased consumption of fresh fruit, vegetables, and low fat dairy, alcohol moderation, and smoking cessation.;Monitor prescription use compliance.    Expected Outcomes Short Term: Continued assessment and intervention until BP is < 140/39mm HG in hypertensive participants. < 130/33mm HG in hypertensive participants with diabetes, heart failure or chronic kidney disease.;Long Term: Maintenance of blood pressure at goal  levels.    Lipids Yes    Intervention Provide education and support for participant on nutrition & aerobic/resistive exercise along with prescribed medications to achieve LDL 70mg , HDL >40mg .    Expected Outcomes Short Term: Participant states understanding of desired cholesterol values and is compliant with medications prescribed. Participant is following exercise prescription and nutrition guidelines.;Long Term: Cholesterol controlled with medications as prescribed, with individualized exercise RX and with personalized nutrition plan. Value goals: LDL < 70mg , HDL > 40 mg.             Core Components/Risk Factors/Patient Goals Review:   Goals and Risk Factor Review  Row Name 08/29/22 1217 09/06/22 1409           Core Components/Risk Factors/Patient Goals Review   Personal Goals Review Weight Management/Obesity;Hypertension;Lipids;Stress Weight Management/Obesity;Hypertension;Lipids;Stress      Review Ed started cardiac rehab on 08/28/22 and did well with exercise. Vital signs were stable Ed started cardiac rehab on 08/28/22. Ed has been doing well with exercise. Ed did report feeling lightheaded post exercise on 09/04/22. Encourged to increase fluid intake and to eat before coming to class.      Expected Outcomes Ed will continue to participate in intensive cardiac rehab for exercise, nutrition and lifestyle modifications Ed will continue to participate in intensive cardiac rehab for exercise, nutrition and lifestyle modifications               Core Components/Risk Factors/Patient Goals at Discharge (Final Review):   Goals and Risk Factor Review - 09/06/22 1409       Core Components/Risk Factors/Patient Goals Review   Personal Goals Review Weight Management/Obesity;Hypertension;Lipids;Stress    Review Ed started cardiac rehab on 08/28/22. Ed has been doing well with exercise. Ed did report feeling lightheaded post exercise on 09/04/22. Encourged to increase fluid intake and  to eat before coming to class.    Expected Outcomes Ed will continue to participate in intensive cardiac rehab for exercise, nutrition and lifestyle modifications             ITP Comments:  ITP Comments     Row Name 08/22/22 1300 08/29/22 1212 09/06/22 1357       ITP Comments Armanda Magic, MD: Medical Director.  Intoduction to the [employer] / Intensive Cardiac Rehab.  Initial orientation packet reviewed with the patient. 30 day ITP Review. Ed started cardiac rehab on 08/28/22 and did well with exercise/ 30 day ITP Review. Ed started cardiac rehab on 08/28/22 and is off to a good start to exercise. Ed was lightheaded post exercise on 09/04/22. Encouraged to drink more water              Comments: See ITP comments.[name] RN BSN

## 2022-09-08 ENCOUNTER — Encounter (HOSPITAL_COMMUNITY)
Admission: RE | Admit: 2022-09-08 | Discharge: 2022-09-08 | Disposition: A | Discharge status: Home / Self Care | Payer: Medicare Other | Source: Ambulatory Visit | Attending: Internal Medicine | Admitting: Internal Medicine

## 2022-09-08 ENCOUNTER — Other Ambulatory Visit: Payer: Self-pay | Admitting: Internal Medicine

## 2022-09-08 ENCOUNTER — Encounter (HOSPITAL_COMMUNITY): Payer: Medicare Other

## 2022-09-08 DIAGNOSIS — Z955 Presence of coronary angioplasty implant and graft: Secondary | ICD-10-CM | POA: Diagnosis not present

## 2022-09-08 DIAGNOSIS — Z48812 Encounter for surgical aftercare following surgery on the circulatory system: Secondary | ICD-10-CM | POA: Diagnosis not present

## 2022-09-08 NOTE — Progress Notes (Signed)
CARDIAC REHAB PHASE 2  Reviewed home exercise with pt today. Pt is tolerating exercise well. Pt will continue to exercise on his own by walking, using his stationary bike, and stretches for 30-45 minutes per session 1-2 days a week in addition to the 3 days in CRP2. Advised pt on THRR, RPE scale, hydration and temperature/humidity precautions. Reinforced NTG use, S/S to stop exercise and when to call MD vs 911. Encouraged warm up cool down and stretches with exercise sessions. Pt verbalized understanding, all questions were answered and pt was given a copy to take home.    Harrie Jeans ACSM-CEP 09/08/2022 4:52 PM

## 2022-09-11 ENCOUNTER — Encounter (HOSPITAL_COMMUNITY): Payer: Medicare Other

## 2022-09-11 ENCOUNTER — Encounter (HOSPITAL_COMMUNITY)
Admission: RE | Admit: 2022-09-11 | Discharge: 2022-09-11 | Disposition: A | Discharge status: Home / Self Care | Payer: Medicare Other | Source: Ambulatory Visit | Attending: Internal Medicine | Admitting: Internal Medicine

## 2022-09-11 DIAGNOSIS — Z955 Presence of coronary angioplasty implant and graft: Secondary | ICD-10-CM | POA: Diagnosis not present

## 2022-09-11 DIAGNOSIS — Z48812 Encounter for surgical aftercare following surgery on the circulatory system: Secondary | ICD-10-CM | POA: Diagnosis not present

## 2022-09-12 ENCOUNTER — Ambulatory Visit: Payer: Medicare Other | Attending: Cardiology

## 2022-09-12 DIAGNOSIS — I2583 Coronary atherosclerosis due to lipid rich plaque: Secondary | ICD-10-CM | POA: Diagnosis not present

## 2022-09-12 DIAGNOSIS — I10 Essential (primary) hypertension: Secondary | ICD-10-CM | POA: Diagnosis not present

## 2022-09-12 DIAGNOSIS — I2510 Atherosclerotic heart disease of native coronary artery without angina pectoris: Secondary | ICD-10-CM | POA: Diagnosis not present

## 2022-09-12 DIAGNOSIS — M79602 Pain in left arm: Secondary | ICD-10-CM

## 2022-09-12 LAB — COMPREHENSIVE METABOLIC PANEL
ALT: 20 IU/L (ref 0–44)
AST: 27 IU/L (ref 0–40)
Albumin: 4.5 g/dL (ref 3.8–4.8)
Alkaline Phosphatase: 76 IU/L (ref 44–121)
BUN/Creatinine Ratio: 14 (ref 10–24)
BUN: 29 mg/dL — ABNORMAL HIGH (ref 8–27)
Bilirubin Total: 1.2 mg/dL (ref 0.0–1.2)
CO2: 23 mmol/L (ref 20–29)
Calcium: 9.1 mg/dL (ref 8.6–10.2)
Creatinine, Ser: 2.07 mg/dL — ABNORMAL HIGH (ref 0.76–1.27)
Globulin, Total: 1.9 g/dL (ref 1.5–4.5)
Glucose: 90 mg/dL (ref 70–99)
Total Protein: 6.4 g/dL (ref 6.0–8.5)
eGFR: 24 mL/min/{1.73_m2} — ABNORMAL LOW (ref >59)

## 2022-09-12 LAB — NMR, LIPOPROFILE

## 2022-09-13 ENCOUNTER — Encounter (HOSPITAL_COMMUNITY): Payer: Medicare Other

## 2022-09-13 ENCOUNTER — Encounter (HOSPITAL_COMMUNITY)
Admission: RE | Admit: 2022-09-13 | Discharge: 2022-09-13 | Disposition: A | Discharge status: Home / Self Care | Payer: Medicare Other | Source: Ambulatory Visit | Attending: Internal Medicine | Admitting: Internal Medicine

## 2022-09-13 DIAGNOSIS — Z955 Presence of coronary angioplasty implant and graft: Secondary | ICD-10-CM

## 2022-09-13 DIAGNOSIS — Z48812 Encounter for surgical aftercare following surgery on the circulatory system: Secondary | ICD-10-CM | POA: Diagnosis not present

## 2022-09-15 ENCOUNTER — Encounter (HOSPITAL_COMMUNITY): Payer: Medicare Other

## 2022-09-15 ENCOUNTER — Encounter (HOSPITAL_COMMUNITY)
Admission: RE | Admit: 2022-09-15 | Discharge: 2022-09-15 | Disposition: A | Discharge status: Home / Self Care | Payer: Medicare Other | Source: Ambulatory Visit | Attending: Internal Medicine | Admitting: Internal Medicine

## 2022-09-15 DIAGNOSIS — Z955 Presence of coronary angioplasty implant and graft: Secondary | ICD-10-CM

## 2022-09-15 DIAGNOSIS — Z48812 Encounter for surgical aftercare following surgery on the circulatory system: Secondary | ICD-10-CM | POA: Diagnosis not present

## 2022-09-15 NOTE — Progress Notes (Signed)
Pt participated in Cardiac Rehab today. Spoke with pt, pt stated he felt lightheaded/dizzy this am for about . Pt stated his BP was 122/45. Pt stated he didn't eat anything yet, but ate and felt better but symptoms started again maybe lasting for only 5 min or so. Pt provided with BP log and instructed to take BP 2x daily. Will review BP log when patient returns to class next week.

## 2022-09-18 ENCOUNTER — Telehealth: Payer: Self-pay | Admitting: Internal Medicine

## 2022-09-18 ENCOUNTER — Encounter (HOSPITAL_COMMUNITY): Payer: Medicare Other

## 2022-09-18 DIAGNOSIS — E041 Nontoxic single thyroid nodule: Secondary | ICD-10-CM | POA: Diagnosis not present

## 2022-09-18 DIAGNOSIS — S0990XA Unspecified injury of head, initial encounter: Secondary | ICD-10-CM | POA: Diagnosis not present

## 2022-09-18 DIAGNOSIS — D649 Anemia, unspecified: Secondary | ICD-10-CM | POA: Diagnosis not present

## 2022-09-18 DIAGNOSIS — N179 Acute kidney failure, unspecified: Secondary | ICD-10-CM | POA: Diagnosis not present

## 2022-09-18 DIAGNOSIS — Z7982 Long term (current) use of aspirin: Secondary | ICD-10-CM | POA: Diagnosis not present

## 2022-09-18 DIAGNOSIS — Z9689 Presence of other specified functional implants: Secondary | ICD-10-CM | POA: Diagnosis not present

## 2022-09-18 DIAGNOSIS — E860 Dehydration: Secondary | ICD-10-CM | POA: Diagnosis not present

## 2022-09-18 DIAGNOSIS — Z955 Presence of coronary angioplasty implant and graft: Secondary | ICD-10-CM | POA: Diagnosis not present

## 2022-09-18 DIAGNOSIS — S199XXA Unspecified injury of neck, initial encounter: Secondary | ICD-10-CM | POA: Diagnosis not present

## 2022-09-18 DIAGNOSIS — E871 Hypo-osmolality and hyponatremia: Secondary | ICD-10-CM | POA: Diagnosis not present

## 2022-09-18 DIAGNOSIS — R42 Dizziness and giddiness: Secondary | ICD-10-CM | POA: Diagnosis not present

## 2022-09-18 DIAGNOSIS — Z9989 Dependence on other enabling machines and devices: Secondary | ICD-10-CM | POA: Diagnosis not present

## 2022-09-18 DIAGNOSIS — R55 Syncope and collapse: Secondary | ICD-10-CM | POA: Diagnosis not present

## 2022-09-18 DIAGNOSIS — Z85528 Personal history of other malignant neoplasm of kidney: Secondary | ICD-10-CM | POA: Diagnosis not present

## 2022-09-18 DIAGNOSIS — I10 Essential (primary) hypertension: Secondary | ICD-10-CM | POA: Diagnosis not present

## 2022-09-18 DIAGNOSIS — I959 Hypotension, unspecified: Secondary | ICD-10-CM | POA: Diagnosis not present

## 2022-09-18 DIAGNOSIS — Z79899 Other long term (current) drug therapy: Secondary | ICD-10-CM | POA: Diagnosis not present

## 2022-09-18 DIAGNOSIS — R911 Solitary pulmonary nodule: Secondary | ICD-10-CM | POA: Diagnosis not present

## 2022-09-18 NOTE — Telephone Encounter (Signed)
Yes, stay out of rehab for the week Keep appt on 8/19 Pt needs to keep track of BP at home   Keep log     Say hydrated

## 2022-09-18 NOTE — Telephone Encounter (Signed)
Patient called and was seen in Emergency Room - this is a [facility] - Results should be in Care Everywhere - Please get in touch with patient after you go over these results.  [phone]

## 2022-09-18 NOTE — Telephone Encounter (Signed)
Spoke with patient, he reports he went to ED in [place], [place] for syncopal episode. He states he was bending over the bathroom sink shaving this morning and he passed out. He does not remember anything after losing consciousness. He state he received 2L of IVF in ED, had CT scans and labs. He states they were unable to find any issues other than low BP.  Olmesartan-hydrochlorothiazide was changed to olmesartan 20mg  daily. Medication list updated.  Patient states he is still feeling "woozy" and lightheaded" when bending over or standing from a seated position. Encouraged patient to maintain PO hydration and change positions slowly.  Patient has appt with Dr. Tenny Craw on 09/25/22.   Patient would like to know if he should cancel his cardiac rehab session scheduled for 09/20/22. Will forward to Dr. Tenny Craw to review and advise.

## 2022-09-18 NOTE — Telephone Encounter (Signed)
Patient is calling wanting to know if he should cancel his cardiac rehab this week due to the ED visit he had today for syncope.   Records from ED in care everywhere.   Please advise.

## 2022-09-19 NOTE — Telephone Encounter (Signed)
Spoke to patient who verbalizes understanding to stop cardiac rehab per Dr. Tenny Craw until he is seen on 09/25/22. Advised patient to keep track of BP at home and stay hydrated, patient agrees to plan.

## 2022-09-20 ENCOUNTER — Encounter (HOSPITAL_COMMUNITY): Payer: Medicare Other

## 2022-09-20 NOTE — Telephone Encounter (Signed)
Noted. I read ER note.  He was seen for syncope, dehydration, hypotension. He is status post recent stents placement HCTZ, Viagra are on hold Follow-up with Dr. Tenny Craw and me. Thanks

## 2022-09-22 ENCOUNTER — Other Ambulatory Visit: Payer: Self-pay | Admitting: Internal Medicine

## 2022-09-22 ENCOUNTER — Encounter (HOSPITAL_COMMUNITY): Payer: Medicare Other

## 2022-09-24 NOTE — Progress Notes (Unsigned)
Cardiology Office Note   Date:  09/25/2022   ID:  Chad Palmer, DOB July 29, 1945, MRN [id number]  PCP:  Tresa Garter, MD  Cardiologist:   Dietrich Pates, MD   Pt referred for arm pain and atherosclerosis    History of Present Illness: Chad Palmer is a 77 y.o. male with a history of GERD, HTN.    Has had tingling and numbness in L arm in past   No pain  Seen by A Plotnikov in April 2024  Complained of L arm pain with exertion   New  Started in Feb  Can get with walking up a hill but not every   Occasionally when carrying things  Patient says it usually on underside of upper arm  Sometimes from shoulder sometimes from neck  Occasional tingling in hand  No PND   No episodes of arm pain at rest  No CP     Breathing is not as good as it used to be  Gets out of breath if walking, going up hil     Pt had L kidney removed 3 years ago   Feels his stamina is not as good as prior   I sawa the pt earliery this year   Set up for PET/CT which was highly positive for ischemia   The pt went on to ave [facility] with hydration prior   He had successful intervention for 100% LAD     THe pt was seen by Margaretha Glassing post procedure  The pt was entered into cardiac rehab and says he was doing good  until 09/14/20  Was a little dizzy after session.   ON 09/18/22 he was in [place] when he had a syncopal spell  Became dizzy, sweaty prior.  EMS found him hypotensive   Given 700 cc IV lfuds   Taken  to local ER    CT head done   NEgative     HE was sent home.  Hydrochlorothiazide was taken off of his meds    SInce then he has not fel t as bad but still not normal  Dizzy at times    NOted once his heart racing and dizzy when getting out of shower. He also notes sinus congestion, ear fullness, drainage down throat  BP at home mostly 110s to 140   Occasional 100s/  Eating food but says swallowing pills is hard   Hx of stricture with dilation in past     Current Meds  Medication Sig   aspirin EC (ASPIRIN LOW DOSE)  81 MG tablet TAKE 1 TABLET BY MOUTH DAILY SWALLOW WHOLE   atorvastatin (LIPITOR) 40 MG tablet Take 1 tablet (40 mg total) by mouth daily.   Calcium-Vitamin D-Vitamin K (CALCIUM + D + K PO) Take 1 tablet by mouth daily.   clopidogrel (PLAVIX) 75 MG tablet Take 1 tablet (75 mg total) by mouth daily.   Cyanocobalamin (B-12) 2500 MCG TABS Take 2,500 mcg by mouth daily.   finasteride (PROSCAR) 5 MG tablet Take 1 tablet (5 mg total) by mouth daily.   fluticasone (FLONASE) 50 MCG/ACT nasal spray PLACE 2 SPRAYS INTO BOTH NOSTRILS DAILY   folic acid (FOLVITE) 1 MG tablet Take 1 tablet (1 mg total) by mouth daily.   Histamine Dihydrochloride (AUSTRALIAN DREAM ARTHRITIS EX) Apply 1 Application topically daily as needed (pain).   lidocaine (LIDODERM) 5 % Place 1 patch onto the skin daily as needed (pain). Remove & Discard patch within 12 hours or  as directed by MD   Magnesium 250 MG TABS Take 250 mg by mouth daily.   nitroGLYCERIN (NITROSTAT) 0.4 MG SL tablet DISSOLVE 1 TAB UNDER TONGUE FOR CHEST PAIN - IF PAIN REMAINS AFTER 5 MIN, CALL 911 AND REPEAT DOSE. MAX 3 TABS IN 15 MINUTES   pantoprazole (PROTONIX) 40 MG tablet TAKE 1 TABLET BY MOUTH DAILY   Probiotic Product (ALIGN) 4 MG CAPS Take 4 mg by mouth at bedtime.   sildenafil (VIAGRA) 100 MG tablet Take 50 mg by mouth daily as needed for erectile dysfunction.   zolpidem (AMBIEN) 10 MG tablet Take 1 tablet (10 mg total) by mouth at bedtime.   [DISCONTINUED] olmesartan (BENICAR) 20 MG tablet Take 20 mg by mouth daily.     Allergies:   Levofloxacin, Omeprazole, Rapaflo [silodosin], and Bactrim [sulfamethoxazole-trimethoprim]   Past Medical History:  Diagnosis Date   Allergic rhinitis    Arthritis    BPH (benign prostatic hyperplasia)    Cancer (HCC)    kidney    Cataract    bilateral    Cervical spinal stenosis    Dr Dohmier   Colon polyp    Diverticulosis of colon    ED (erectile dysfunction)    Elevated PSA    GERD (gastroesophageal reflux  disease)    Hemorrhoids    HTN (hypertension)    Microcytosis     Past Surgical History:  Procedure Laterality Date   cataract surg     BIL   CORONARY CTO INTERVENTION N/A 08/01/2022   Procedure: CORONARY CTO INTERVENTION;  Surgeon: Orbie Pyo, MD;  Location: [facility];  Service: Cardiovascular;  Laterality: N/A;   CORONARY ULTRASOUND/IVUS N/A 08/01/2022   Procedure: Coronary Ultrasound/IVUS;  Surgeon: Orbie Pyo, MD;  Location: [facility];  Service: Cardiovascular;  Laterality: N/A;   HEMORRHOID SURGERY     LAPAROSCOPIC NEPHRECTOMY Left 12/22/2019   Procedure: LAPAROSCOPIC RADICAL NEPHRECTOMY;  Surgeon: Heloise Purpura, MD;  Location: [facility];  Service: Urology;  Laterality: Left;   LEFT HEART CATH AND CORONARY ANGIOGRAPHY N/A 08/01/2022   Procedure: LEFT HEART CATH AND CORONARY ANGIOGRAPHY;  Surgeon: Orbie Pyo, MD;  Location: [facility];  Service: Cardiovascular;  Laterality: N/A;   TOENAIL EXCISION  2009   ALL   TONSILLECTOMY     VASECTOMY     WISDOM TOOTH EXTRACTION       Social History:  The patient  reports that he has never smoked. He has never used smokeless tobacco. He reports that he does not drink alcohol and does not use drugs.   Family History:  The patient's family history includes Alzheimer's disease in his brother and mother; Heart disease in his father; Hypertension in an other family member.    ROS:  Please see the history of present illness. All other systems are reviewed and  Negative to the above problem except as noted.    PHYSICAL EXAM: VS:  BP 108/64   Pulse 93   Ht 5' 8.8" (1.748 m)   Wt 172 lb 3.2 oz (78.1 kg)   SpO2 98%   BMI 25.58 kg/m   Laying 138/80  P 87  SItting  130/80  P 80   Standing   140/82  P 90  Standing 4 imn 128/74  P 91    GEN: Well nourished, well developed, in no acute distress  HEENT: normal   Ears   TM clear  Neck: no JVD, carotid bruit Cardiac: RRR; no  murmurs  edema  Respiratory:   clear to auscultation bilaterally GI: soft, nontender, nondistended No hepatomegaly  MS: no deformity Moving all extremities   Skin: warm and dry, no rash Neuro:  Strength and sensation are intact  CN II to XII intact   F to nose intact  Romberg negative    Heel to toe  Pt unsteady   Psych: euthymic mood, full affect   EKG:  not done    [facility]  August 01 2022   1st Diag lesion is 30% stenosed.   Mid LAD lesion is 100% stenosed.   A stent was successfully placed.   Post intervention, there is a 0% residual stenosis.   1.  Mid LAD chronic total occlusion treated with 2 overlapping drug-eluting stents with cutting balloon angioplasty and IVUS guidance. 2.  LVEDP of 26 mmHg.   Recommendation: 6 hours of IV fluids, same-day discharge with dual antiplatelet therapy consisting of aspirin and Plavix for 6 months then Plavix monotherapy indefinitely.  PET/CT   JUne 2024     Findings are consistent with ischemia. The study is high risk due to LAD territory ischemia, the presence of TID, and the drop in EF with stress. Recommend coronary catheterization for further evaluation.   LV perfusion is abnormal. There is evidence of ischemia. Defect 1: There is a large defect with severe reduction in uptake present in the apical to mid anterior, anteroseptal, inferoseptal, septal and apex location(s) that is reversible. There is abnormal wall motion in the defect area. Consistent with ischemia. The defect is consistent with abnormal perfusion in the LAD territory. Defect 2: There is a small defect present in the apical inferior location(s). There is abnormal wall motion in the defect area. Consistent with ischemia.   Rest left ventricular function is normal. Rest EF: 52 %. Stress left ventricular function is normal. Stress EF: 50 %. The EF dropped slightly with stress in the presence of significant ischemia. Recommend cath as detailed above.  End diastolic cavity size is normal. End systolic cavity size is  normal.   Myocardial blood flow was computed to be 0.47ml/g/min at rest and 1.55ml/g/min at stress. Global myocardial blood flow reserve was 1.68 and was abnormal. Notably, the MBF was most reduced in the LAD territory which is consistent with the large perfusion defect as detailed above.   Coronary calcium was present on the attenuation correction CT images. Moderate coronary calcifications were present. Coronary calcifications were present in the left anterior descending artery and left circumflex artery distribution(s).   Electronically Signed: Laurance Flatten, MD  Calcium score CT  2021  Coronary calcium score of 180 Agatston units. This was 44th percentile for age and sex matched control, suggesting low to intermediate risk for future cardiac events.  Lipid Panel    Component Value Date/Time   CHOL 201 (H) 05/16/2022 1454   TRIG 147.0 05/16/2022 1454   HDL 40.20 05/16/2022 1454   CHOLHDL 5 05/16/2022 1454   VLDL 29.4 05/16/2022 1454   LDLCALC 131 (H) 05/16/2022 1454      Wt Readings from Last 3 Encounters:  09/25/22 172 lb 3.2 oz (78.1 kg)  08/22/22 181 lb 10.5 oz (82.4 kg)  08/11/22 181 lb (82.1 kg)      ASSESSMENT AND PLAN:  1  DIzziness   New since 09/15/22    Syncopal spell on 8/12   NOt orthostatic on exam   Is unsteady REcomm   Cut olmesartan in 1/2   Follow BP Check CMET, CBC  Hydrate MRI of brain eval for CVA     REferral to ENT     2   CAD  Pt with recent PTCA/DES to LAD   Doing well from cardiac standpoint   3   HTN   Cut back on meds as noted    Follow   5  HL    27  HDL 46  Trig 52.  Keep on Repatha  1  Arm pain   Pt complains of arm pain with exertion    Erratic, does not occur all the time   Never at rest    Does have a hx of DJD but overall concerning   Denies CP With CAD noted I would recomm Lexiscan PET/CT to evaluate for ischemia Does note some DOE   May consider echo.  See what stress test shows first   2 CAD   Ca score in 2021 was 180   Modify  risk factors  3  HL  last LDL 131  HDL 40   Start lipitor  Will check lipomed and CMET in 2 months   4  Renal  S/p nephrectomy for renal CA  Will recheck BMET   Current medicines are reviewed at length with the patient today.  The patient does not have concerns regarding medicines.  Signed, Dietrich Pates, MD  09/25/2022 1:04 PM    [facility] [address] [place], [place], [place]  [address] Phone: [phone]; Fax: [phone]

## 2022-09-25 ENCOUNTER — Telehealth (HOSPITAL_COMMUNITY): Payer: Self-pay | Admitting: *Deleted

## 2022-09-25 ENCOUNTER — Encounter (HOSPITAL_COMMUNITY): Payer: Medicare Other

## 2022-09-25 ENCOUNTER — Encounter: Payer: Self-pay | Admitting: Internal Medicine

## 2022-09-25 ENCOUNTER — Ambulatory Visit: Payer: Medicare Other | Admitting: Internal Medicine

## 2022-09-25 VITALS — BP 108/64 | HR 93 | Ht 68.8 in | Wt 172.2 lb

## 2022-09-25 DIAGNOSIS — R739 Hyperglycemia, unspecified: Secondary | ICD-10-CM | POA: Diagnosis not present

## 2022-09-25 DIAGNOSIS — Z955 Presence of coronary angioplasty implant and graft: Secondary | ICD-10-CM | POA: Diagnosis not present

## 2022-09-25 DIAGNOSIS — H814 Vertigo of central origin: Secondary | ICD-10-CM | POA: Diagnosis not present

## 2022-09-25 DIAGNOSIS — S090XXD Injury of blood vessels of head, not elsewhere classified, subsequent encounter: Secondary | ICD-10-CM | POA: Insufficient documentation

## 2022-09-25 DIAGNOSIS — I10 Essential (primary) hypertension: Secondary | ICD-10-CM | POA: Insufficient documentation

## 2022-09-25 DIAGNOSIS — I2510 Atherosclerotic heart disease of native coronary artery without angina pectoris: Secondary | ICD-10-CM | POA: Insufficient documentation

## 2022-09-25 MED ORDER — OLMESARTAN MEDOXOMIL 20 MG PO TABS: 10.0000 mg | ORAL_TABLET | Freq: Every day | ORAL | 3 refills | Status: DC

## 2022-09-25 NOTE — Telephone Encounter (Signed)
Good afternoon Dr Tenny Craw. You saw me Nishikawa earlier today. I saw that you cut his olmesartan in half. Is it okay for mr Torry to return to exercise at cardiac rehab?  25 mins PR Pricilla Riffle, MD Not yet He is still dizzy I have an MRI scheduled  10 mins PR Will hold cardiac rehab until cleared to return to exercise per Dr Tenny Craw. [name] RN BSN

## 2022-09-25 NOTE — Patient Instructions (Signed)
Medication Instructions:  TAKE 1/2 OF OLMESARTAN DAILY  *If you need a refill on your cardiac medications before your next appointment, please call your pharmacy*   Lab Work: CMET, CBC, HGBA1C If you have labs (blood work) drawn today and your tests are completely normal, you will receive your results only by: MyChart Message (if you have MyChart) OR A paper copy in the mail If you have any lab test that is abnormal or we need to change your treatment, we will call you to review the results.   Testing/Procedures: MRI OF THE HEAD WITHOUT CONTRAST   Follow-Up: At [facility], you and your health needs are our priority.  As part of our continuing mission to provide you with exceptional heart care, we have created designated Provider Care Teams.  These Care Teams include your primary Cardiologist (physician) and Advanced Practice Providers (APPs -  Physician Assistants and Nurse Practitioners) who all work together to provide you with the care you need, when you need it.  We recommend signing up for the patient portal called "MyChart".  Sign up information is provided on this After Visit Summary.  MyChart is used to connect with patients for Virtual Visits (Telemedicine).  Patients are able to view lab/test results, encounter notes, upcoming appointments, etc.  Non-urgent messages can be sent to your provider as well.   To learn more about what you can do with MyChart, go to [email or web address].    Other Instructions ENT REFERRAL

## 2022-09-25 NOTE — Telephone Encounter (Signed)
Left message to call cardiac rehab.Maria Walden Whitaker RN BSN  

## 2022-09-26 ENCOUNTER — Other Ambulatory Visit: Payer: Medicare Other

## 2022-09-26 LAB — COMPREHENSIVE METABOLIC PANEL
ALT: 17 IU/L (ref 0–44)
AST: 19 IU/L (ref 0–40)
Albumin: 4.6 g/dL (ref 3.8–4.8)
Alkaline Phosphatase: 77 IU/L (ref 44–121)
BUN/Creatinine Ratio: 12 (ref 10–24)
BUN: 21 mg/dL (ref 8–27)
Bilirubin Total: 0.7 mg/dL (ref 0.0–1.2)
CO2: 23 mmol/L (ref 20–29)
Calcium: 9.6 mg/dL (ref 8.6–10.2)
Chloride: 99 mmol/L (ref 96–106)
Creatinine, Ser: 1.71 mg/dL — ABNORMAL HIGH (ref 0.76–1.27)
Globulin, Total: 2.2 g/dL (ref 1.5–4.5)
Glucose: 89 mg/dL (ref 70–99)
Potassium: 4.8 mmol/L (ref 3.5–5.2)
Sodium: 136 mmol/L (ref 134–144)
Total Protein: 6.8 g/dL (ref 6.0–8.5)
eGFR: 40 mL/min/{1.73_m2} — ABNORMAL LOW (ref >59)

## 2022-09-26 LAB — HEMOGLOBIN A1C
Est. average glucose Bld gHb Est-mCnc: 128 mg/dL
Hgb A1c MFr Bld: 6.1 % — ABNORMAL HIGH (ref 4.8–5.6)

## 2022-09-26 LAB — CBC
Hematocrit: 35.5 % — ABNORMAL LOW (ref 37.5–51.0)
Hemoglobin: 10.7 g/dL — ABNORMAL LOW (ref 13.0–17.7)
MCH: 18.6 pg — ABNORMAL LOW (ref 26.6–33.0)
MCHC: 30.1 g/dL — ABNORMAL LOW (ref 31.5–35.7)
MCV: 62 fL — ABNORMAL LOW (ref 79–97)
Platelets: 214 10*3/uL (ref 150–450)
RBC: 5.74 x10E6/uL (ref 4.14–5.80)
RDW: 18.2 % — ABNORMAL HIGH (ref 11.6–15.4)
WBC: 7.2 10*3/uL (ref 3.4–10.8)

## 2022-09-27 ENCOUNTER — Encounter (HOSPITAL_COMMUNITY): Payer: Medicare Other

## 2022-09-28 ENCOUNTER — Other Ambulatory Visit: Payer: Self-pay

## 2022-09-28 DIAGNOSIS — D509 Iron deficiency anemia, unspecified: Secondary | ICD-10-CM

## 2022-09-28 DIAGNOSIS — D649 Anemia, unspecified: Secondary | ICD-10-CM

## 2022-09-29 ENCOUNTER — Encounter (HOSPITAL_COMMUNITY): Payer: Medicare Other

## 2022-10-02 ENCOUNTER — Encounter (HOSPITAL_COMMUNITY): Payer: Medicare Other

## 2022-10-02 ENCOUNTER — Ambulatory Visit (HOSPITAL_COMMUNITY)
Admission: RE | Admit: 2022-10-02 | Discharge: 2022-10-02 | Disposition: A | Discharge status: Home / Self Care | Payer: Medicare Other | Source: Ambulatory Visit | Attending: Internal Medicine | Admitting: Internal Medicine

## 2022-10-02 DIAGNOSIS — S090XXD Injury of blood vessels of head, not elsewhere classified, subsequent encounter: Secondary | ICD-10-CM | POA: Diagnosis not present

## 2022-10-02 DIAGNOSIS — R42 Dizziness and giddiness: Secondary | ICD-10-CM | POA: Diagnosis not present

## 2022-10-02 DIAGNOSIS — H814 Vertigo of central origin: Secondary | ICD-10-CM | POA: Diagnosis not present

## 2022-10-02 DIAGNOSIS — G44309 Post-traumatic headache, unspecified, not intractable: Secondary | ICD-10-CM | POA: Diagnosis not present

## 2022-10-02 DIAGNOSIS — R202 Paresthesia of skin: Secondary | ICD-10-CM | POA: Diagnosis not present

## 2022-10-02 DIAGNOSIS — R200 Anesthesia of skin: Secondary | ICD-10-CM | POA: Diagnosis not present

## 2022-10-03 ENCOUNTER — Encounter (HOSPITAL_COMMUNITY): Payer: Self-pay | Admitting: *Deleted

## 2022-10-03 DIAGNOSIS — Z955 Presence of coronary angioplasty implant and graft: Secondary | ICD-10-CM

## 2022-10-03 NOTE — Progress Notes (Signed)
Cardiac Individual Treatment Plan  Patient Details  Name: Chad Palmer MRN: [id number] Date of Birth: June 06, 1945 Referring Provider:   Flowsheet Row INTENSIVE CARDIAC REHAB ORIENT from 08/22/2022 in [facility]  Referring Provider Dietrich Pates, MD       Initial Encounter Date:  Flowsheet Row INTENSIVE CARDIAC REHAB ORIENT from 08/22/2022 in [facility]  Date 08/22/22       Visit Diagnosis: 08/01/22 Status post coronary artery stent placement LAD  Patient's Home Medications on Admission:  Current Outpatient Medications:    aspirin EC (ASPIRIN LOW DOSE) 81 MG tablet, TAKE 1 TABLET BY MOUTH DAILY SWALLOW WHOLE, Disp: 90 tablet, Rfl: 3   atorvastatin (LIPITOR) 40 MG tablet, Take 1 tablet (40 mg total) by mouth daily., Disp: 90 tablet, Rfl: 1   Calcium-Vitamin D-Vitamin K (CALCIUM + D + K PO), Take 1 tablet by mouth daily., Disp: , Rfl:    clopidogrel (PLAVIX) 75 MG tablet, Take 1 tablet (75 mg total) by mouth daily., Disp: 30 tablet, Rfl: 6   Cyanocobalamin (B-12) 2500 MCG TABS, Take 2,500 mcg by mouth daily., Disp: , Rfl:    finasteride (PROSCAR) 5 MG tablet, Take 1 tablet (5 mg total) by mouth daily., Disp: 90 tablet, Rfl: 3   fluticasone (FLONASE) 50 MCG/ACT nasal spray, PLACE 2 SPRAYS INTO BOTH NOSTRILS DAILY, Disp: 16 mL, Rfl: 0   folic acid (FOLVITE) 1 MG tablet, Take 1 tablet (1 mg total) by mouth daily., Disp: 90 tablet, Rfl: 1   Histamine Dihydrochloride (AUSTRALIAN DREAM ARTHRITIS EX), Apply 1 Application topically daily as needed (pain)., Disp: , Rfl:    lidocaine (LIDODERM) 5 %, Place 1 patch onto the skin daily as needed (pain). Remove & Discard patch within 12 hours or as directed by MD, Disp: , Rfl:    Magnesium 250 MG TABS, Take 250 mg by mouth daily., Disp: , Rfl:    nitroGLYCERIN (NITROSTAT) 0.4 MG SL tablet, DISSOLVE 1 TAB UNDER TONGUE FOR CHEST PAIN - IF  PAIN REMAINS AFTER 5 MIN, CALL 911 AND REPEAT DOSE. MAX 3 TABS IN 15 MINUTES, Disp: 25 tablet, Rfl: 0   olmesartan (BENICAR) 20 MG tablet, Take 0.5 tablets (10 mg total) by mouth daily., Disp: 45 tablet, Rfl: 3   pantoprazole (PROTONIX) 40 MG tablet, TAKE 1 TABLET BY MOUTH DAILY, Disp: 90 tablet, Rfl: 3   Probiotic Product (ALIGN) 4 MG CAPS, Take 4 mg by mouth at bedtime., Disp: , Rfl:    sildenafil (VIAGRA) 100 MG tablet, Take 50 mg by mouth daily as needed for erectile dysfunction., Disp: , Rfl:    zolpidem (AMBIEN) 10 MG tablet, Take 1 tablet (10 mg total) by mouth at bedtime., Disp: 90 tablet, Rfl: 1  Past Medical History: Past Medical History:  Diagnosis Date   Allergic rhinitis    Arthritis    BPH (benign prostatic hyperplasia)    Cancer (HCC)    kidney    Cataract    bilateral    Cervical spinal stenosis    Dr Dohmier   Colon polyp    Diverticulosis of colon    Chad Palmer (erectile dysfunction)    Elevated PSA    GERD (gastroesophageal reflux disease)    Hemorrhoids    HTN (hypertension)    Microcytosis     Tobacco Use: Social History   Tobacco Use  Smoking Status Never  Smokeless Tobacco Never  Labs: Review Flowsheet  More data exists      Latest Ref Rng & Units 07/03/2019 02/19/2020 01/26/2021 05/16/2022 09/25/2022  Labs for ITP Cardiac and Pulmonary Rehab  Cholestrol 0 - 200 mg/dL 638  - 756  433  -  LDL (calc) 0 - 99 mg/dL 295  - 188  416  -  HDL-C >39.00 mg/dL 60.63  - 01.60  10.93  -  Trlycerides 0.0 - 149.0 mg/dL 23.5  - 573.2  202.5  -  Hemoglobin A1c 4.8 - 5.6 % - 5.9  - - 6.1     Details            Capillary Blood Glucose: Lab Results  Component Value Date   GLUCAP 107 (H) 09/04/2022   GLUCAP 92 11/21/2019     Exercise Target Goals: Exercise Program Goal: Individual exercise prescription set using results from initial 6 min walk test and THRR while considering  patient's activity barriers and safety.   Exercise Prescription Goal: Initial  exercise prescription builds to 30-45 minutes a day of aerobic activity, 2-3 days per week.  Home exercise guidelines will be given to patient during program as part of exercise prescription that the participant will acknowledge.  Activity Barriers & Risk Stratification:  Activity Barriers & Cardiac Risk Stratification - 08/22/22 1436       Activity Barriers & Cardiac Risk Stratification   Activity Barriers Balance Concerns;Arthritis;Joint Problems;Back Problems;Neck/Spine Problems    Cardiac Risk Stratification High             6 Minute Walk:  6 Minute Walk     Row Name 08/22/22 1439         6 Minute Walk   Phase Initial     Distance 1416 feet     Walk Time 6 minutes     # of Rest Breaks 0     MPH 2.7     METS 2.72     RPE 11     Perceived Dyspnea  0     VO2 Peak 9.52     Symptoms No     Resting HR 71 bpm     Resting BP 116/70     Resting Oxygen Saturation  97 %     Exercise Oxygen Saturation  during 6 min walk 99 %     Max Ex. HR 102 bpm     Max Ex. BP 130/68     2 Minute Post BP 114/66              Oxygen Initial Assessment:   Oxygen Re-Evaluation:   Oxygen Discharge (Final Oxygen Re-Evaluation):   Initial Exercise Prescription:  Initial Exercise Prescription - 08/22/22 1400       Date of Initial Exercise RX and Referring Provider   Date 08/22/22    Referring Provider Dietrich Pates, MD    Expected Discharge Date 11/08/22      Recumbant Elliptical   Level 2    RPM 50    Watts 25    Minutes 15    METs 2.72      Track   Laps 14    Minutes 15    METs 2.72      Prescription Details   Frequency (times per week) 3    Duration Progress to 30 minutes of continuous aerobic without signs/symptoms of physical distress      Intensity   THRR 40-80% of Max Heartrate 11/15/22    Ratings of Perceived Exertion 11-13  Perceived Dyspnea 0-4      Progression   Progression Continue progressive overload as per policy without signs/symptoms or physical  distress.      Resistance Training   Training Prescription Yes    Weight 3 lbs    Reps 10-15             Perform Capillary Blood Glucose checks as needed.  Exercise Prescription Changes:   Exercise Prescription Changes     Row Name 08/28/22 1642 09/08/22 1640           Response to Exercise   Blood Pressure (Admit) 130/72 128/62      Blood Pressure (Exercise) 150/80 122/66      Blood Pressure (Exit) 112/70 102/50      Heart Rate (Admit) 70 bpm 77 bpm      Heart Rate (Exercise) 119 bpm 114 bpm      Heart Rate (Exit) 93 bpm 86 bpm      Rating of Perceived Exertion (Exercise) 11.5 9.5      Perceived Dyspnea (Exercise) 0 0      Symptoms 0 0      Comments Pt first day in the [facility] program Reviewed MET's, goals and home ExRx      Duration Progress to 30 minutes of  aerobic without signs/symptoms of physical distress Progress to 30 minutes of  aerobic without signs/symptoms of physical distress      Intensity THRR unchanged THRR unchanged        Progression   Progression Continue to progress workloads to maintain intensity without signs/symptoms of physical distress. Continue to progress workloads to maintain intensity without signs/symptoms of physical distress.      Average METs 3.27 3.52        Resistance Training   Training Prescription Yes Yes      Weight 3 lbs 3 lbs      Reps 10-15 10-15      Time 10 Minutes 10 Minutes        Recumbant Elliptical   Level 2 2      RPM 5.7 --      Watts 79 79      Minutes 15 15      METs 3.5 3.6        Track   Laps 16 19      Minutes 15 15      METs 3.04 3.43        Home Exercise Plan   Plans to continue exercise at -- Home (comment)      Frequency -- Add 2 additional days to program exercise sessions.      Initial Home Exercises Provided -- 09/08/22               Exercise Comments:   Exercise Comments     Row Name 08/28/22 1647 09/08/22 1651 09/29/22 0851       Exercise Comments Pt first day in the  [facility] program. Pt tolerated exercise well with an average MET level of 3.27. Pt is learning his THRR, RPE and ExRx. Pt is off to a great start. Will continue to monitor pt and progress workloads as tolerated without sign or symptom Reviewed MET, goals and home ExRx. Pt tolerated exercise well with an average MET level of 3.52. Pt feels good about his goals and is improving his diet and increasing his strength. He will continue to work on his balance and flexibility. Information given today on how to exercise safely. He will  add in 1-2 days of exercise for 30-45 mins per session by walking, using his stationary bike and doing stretches. When he is finished his time in cardiac rehab Patient has been absent since 09/15/22 for syncopal episode at home. Being evaluated by cardiology for return date to cardiac rehab.              Exercise Goals and Review:   Exercise Goals     Row Name 08/22/22 1322             Exercise Goals   Increase Physical Activity Yes       Intervention Provide advice, education, support and counseling about physical activity/exercise needs.;Develop an individualized exercise prescription for aerobic and resistive training based on initial evaluation findings, risk stratification, comorbidities and participant's personal goals.       Expected Outcomes Short Term: Attend rehab on a regular basis to increase amount of physical activity.;Long Term: Exercising regularly at least 3-5 days a week.;Long Term: Add in home exercise to make exercise part of routine and to increase amount of physical activity.       Increase Strength and Stamina Yes       Intervention Provide advice, education, support and counseling about physical activity/exercise needs.;Develop an individualized exercise prescription for aerobic and resistive training based on initial evaluation findings, risk stratification, comorbidities and participant's personal goals.       Expected Outcomes Short Term:  Increase workloads from initial exercise prescription for resistance, speed, and METs.;Short Term: Perform resistance training exercises routinely during rehab and add in resistance training at home;Long Term: Improve cardiorespiratory fitness, muscular endurance and strength as measured by increased METs and functional capacity ( )       Able to understand and use rate of perceived exertion (RPE) scale Yes       Intervention Provide education and explanation on how to use RPE scale       Expected Outcomes Short Term: Able to use RPE daily in rehab to express subjective intensity level;Long Term:  Able to use RPE to guide intensity level when exercising independently       Knowledge and understanding of Target Heart Rate Range (THRR) Yes       Expected Outcomes Short Term: Able to state/look up THRR;Long Term: Able to use THRR to govern intensity when exercising independently;Short Term: Able to use daily as guideline for intensity in rehab       Understanding of Exercise Prescription Yes       Intervention Provide education, explanation, and written materials on patient's individual exercise prescription       Expected Outcomes Short Term: Able to explain program exercise prescription;Long Term: Able to explain home exercise prescription to exercise independently                Exercise Goals Re-Evaluation :  Exercise Goals Re-Evaluation     Row Name 08/28/22 1646 09/08/22 1643           Exercise Goal Re-Evaluation   Exercise Goals Review Increase Physical Activity;Understanding of Exercise Prescription;Increase Strength and Stamina;Knowledge and understanding of Target Heart Rate Range (THRR);Able to understand and use rate of perceived exertion (RPE) scale Increase Physical Activity;Understanding of Exercise Prescription;Increase Strength and Stamina;Knowledge and understanding of Target Heart Rate Range (THRR);Able to understand and use rate of perceived exertion (RPE) scale       Comments Pt first day in the [facility] program. Pt tolerated exercise well with an average MET level of 3.27. Pt is  learning his THRR, RPE and ExRx. Pt is off to a great start. Reviewed MET, goals and home ExRx. Pt tolerated exercise well with an average MET level of 3.52. Pt feels good about his goals and is improving his diet and increasing his strength. He will continue to work on his balance and flexibility. Information given today on how to exercise safely. He will add in 1-2 days of exercise for 30-45 mins per session by walking, using his stationary bike and doing stretches. When he is finished his time in cardiac rehab      Expected Outcomes Will continue to monitor pt and progress workloads as tolerated without sign or symptom Pt will Exercise on his own and gain strength. Will continue to monitor pt and progress workloads as tolerated without sign or symptom.               Discharge Exercise Prescription (Final Exercise Prescription Changes):  Exercise Prescription Changes - 09/08/22 1640       Response to Exercise   Blood Pressure (Admit) 128/62    Blood Pressure (Exercise) 122/66    Blood Pressure (Exit) 102/50    Heart Rate (Admit) 77 bpm    Heart Rate (Exercise) 114 bpm    Heart Rate (Exit) 86 bpm    Rating of Perceived Exertion (Exercise) 9.5    Perceived Dyspnea (Exercise) 0    Symptoms 0    Comments Reviewed MET's, goals and home ExRx    Duration Progress to 30 minutes of  aerobic without signs/symptoms of physical distress    Intensity THRR unchanged      Progression   Progression Continue to progress workloads to maintain intensity without signs/symptoms of physical distress.    Average METs 3.52      Resistance Training   Training Prescription Yes    Weight 3 lbs    Reps 10-15    Time 10 Minutes      Recumbant Elliptical   Level 2    Watts 79    Minutes 15    METs 3.6      Track   Laps 19    Minutes 15    METs 3.43      Home Exercise Plan    Plans to continue exercise at Home (comment)    Frequency Add 2 additional days to program exercise sessions.    Initial Home Exercises Provided 09/08/22             Nutrition:  Target Goals: Understanding of nutrition guidelines, daily intake of sodium 1500mg , cholesterol 200mg , calories 30% from fat and 7% or less from saturated fats, daily to have 5 or more servings of fruits and vegetables.  Biometrics:  Pre Biometrics - 08/22/22 1330       Pre Biometrics   % Body Fat 27 %             Post Biometrics - 08/22/22 1330        Post  Biometrics   Waist Circumference 38 inches    Hip Circumference 41 inches    Waist to Hip Ratio 0.93 %    Triceps Skinfold 16 mm    Grip Strength 34 kg    Flexibility 0 in   Pt could not reach box   Single Leg Stand 6.8 seconds             Nutrition Therapy Plan and Nutrition Goals:  Nutrition Therapy & Goals - 09/29/22 1428       Nutrition  Therapy   Diet Heart Healthy Diet    Drug/Food Interactions Statins/Certain Fruits      Personal Nutrition Goals   Nutrition Goal Patient to identify strategies for reducing cardiovascular risk by attending the [facility] education and nutrition series weekly.    Personal Goal #2 Patient to improve diet quality by using the plate method as a guide for meal planning to include lean protein/plant protein, fruits, vegetables, whole grains, nonfat dairy as part of a well-balanced diet.    Comments Chad Palmer has not attended ICR since 8/9 due to Chad Palmer visit for dehydration and ongoing follow-up appointments. Priort to this, Chad Palmer had attended the [employer] education series regularly. He has good understanding of recommendations for fiber, saturated fat, and sodium. His A1c is in a pre-diabetic range. He was also started on oral iron related to low hemoglobin. Patient will benefit from participation in intensive cardiac rehab for nutrition, exercise, and lifestyle modification.      Intervention Plan   Intervention  Prescribe, educate and counsel regarding individualized specific dietary modifications aiming towards targeted core components such as weight, hypertension, lipid management, diabetes, heart failure and other comorbidities.;Nutrition handout(s) given to patient.    Expected Outcomes Long Term Goal: Adherence to prescribed nutrition plan.;Short Term Goal: Understand basic principles of dietary content, such as calories, fat, sodium, cholesterol and nutrients.             Nutrition Assessments:  Nutrition Assessments - 08/29/22 0929       Rate Your Plate Scores   Pre Score 58            MEDIFICTS Score Key: ?70 Need to make dietary changes  40-70 Heart Healthy Diet ? 40 Therapeutic Level Cholesterol Diet   Flowsheet Row INTENSIVE CARDIAC REHAB from 08/28/2022 in [facility]  Picture Your Plate Total Score on Admission 58      Picture Your Plate Scores: <16 Unhealthy dietary pattern with much room for improvement. 41-50 Dietary pattern unlikely to meet recommendations for good health and room for improvement. 51-60 More healthful dietary pattern, with some room for improvement.  >60 Healthy dietary pattern, although there may be some specific behaviors that could be improved.    Nutrition Goals Re-Evaluation:  Nutrition Goals Re-Evaluation     Row Name 08/28/22 1538 09/29/22 1428           Goals   Current Weight 181 lb 10.5 oz (82.4 kg) 175 lb 7.8 oz (79.6 kg)  weight per last attended session on 8/9      Comment Cholesterole 201, LDL 131 A1c 6.1, Cr 1.71, GFR 40, hemoglobin 10.7; other most recent labs Cholesterole 201, LDL 131      Expected Outcome Patient will benefit from participation in intensive cardiac rehab for nutrition, exercise, and lifestyle modification. Chad Palmer has not attended ICR since 8/9 due to Chad Palmer visit for dehydration and ongoing follow-up appointments. Priort to this, Chad Palmer had attended the [employer]  education series regularly. He has good understanding of recommendations for fiber, saturated fat, and sodium. His A1c is in a pre-diabetic range. He was also started on oral iron related to low hemoglobin. He continues regular follow-up appointments with nephrology related to left nephrectomy.  Patient will benefit from participation in intensive cardiac rehab for nutrition, exercise, and lifestyle modification.               Nutrition Goals Re-Evaluation:  Nutrition Goals Re-Evaluation     Row Name 08/28/22  1538 09/29/22 1428           Goals   Current Weight 181 lb 10.5 oz (82.4 kg) 175 lb 7.8 oz (79.6 kg)  weight per last attended session on 8/9      Comment Cholesterole 201, LDL 131 A1c 6.1, Cr 1.71, GFR 40, hemoglobin 10.7; other most recent labs Cholesterole 201, LDL 131      Expected Outcome Patient will benefit from participation in intensive cardiac rehab for nutrition, exercise, and lifestyle modification. Chad Palmer has not attended ICR since 8/9 due to Chad Palmer visit for dehydration and ongoing follow-up appointments. Priort to this, Chad Palmer had attended the [employer] education series regularly. He has good understanding of recommendations for fiber, saturated fat, and sodium. His A1c is in a pre-diabetic range. He was also started on oral iron related to low hemoglobin. He continues regular follow-up appointments with nephrology related to left nephrectomy.  Patient will benefit from participation in intensive cardiac rehab for nutrition, exercise, and lifestyle modification.               Nutrition Goals Discharge (Final Nutrition Goals Re-Evaluation):  Nutrition Goals Re-Evaluation - 09/29/22 1428       Goals   Current Weight 175 lb 7.8 oz (79.6 kg)   weight per last attended session on 8/9   Comment A1c 6.1, Cr 1.71, GFR 40, hemoglobin 10.7; other most recent labs Cholesterole 201, LDL 131    Expected Outcome Chad Palmer has not attended ICR since 8/9 due to Chad Palmer visit for dehydration and ongoing  follow-up appointments. Priort to this, Chad Palmer had attended the [employer] education series regularly. He has good understanding of recommendations for fiber, saturated fat, and sodium. His A1c is in a pre-diabetic range. He was also started on oral iron related to low hemoglobin. He continues regular follow-up appointments with nephrology related to left nephrectomy.  Patient will benefit from participation in intensive cardiac rehab for nutrition, exercise, and lifestyle modification.             Psychosocial: Target Goals: Acknowledge presence or absence of significant depression and/or stress, maximize coping skills, provide positive support system. Participant is able to verbalize types and ability to use techniques and skills needed for reducing stress and depression.  Initial Review & Psychosocial Screening:  Initial Psych Review & Screening - 08/22/22 1437       Initial Review   Current issues with None Identified      Family Dynamics   Good Support System? Yes   Pt has spouse for support     Barriers   Psychosocial barriers to participate in program There are no identifiable barriers or psychosocial needs.      Screening Interventions   Interventions Encouraged to exercise             Quality of Life Scores:  Quality of Life - 08/22/22 1513       Quality of Life   Select Quality of Life      Quality of Life Scores   Health/Function Pre 26.73 %    Socioeconomic Pre 30 %    Psych/Spiritual Pre 30 %    Family Pre 30 %    GLOBAL Pre 28.52 %            Scores of 19 and below usually indicate a poorer quality of life in these areas.  A difference of  2-3 points is a clinically meaningful difference.  A difference of 2-3 points in the total score of the  Quality of Life Index has been associated with significant improvement in overall quality of life, self-image, physical symptoms, and general health in studies assessing change in quality of life.  PHQ-9: Review  Flowsheet  More data exists      08/22/2022 08/04/2022 05/16/2022 11/09/2021 04/13/2021  Depression screen PHQ 2/9  Decreased Interest 0 0 0 0 0  Down, Depressed, Hopeless 0 0 0 0 0  PHQ - 2 Score 0 0 0 0 0  Altered sleeping 1 - - - -  Tired, decreased energy 1 - - - -  Change in appetite 0 - - - -  Feeling bad or failure about yourself  0 - - - -  Trouble concentrating 0 - - - -  Moving slowly or fidgety/restless 0 - - - -  Suicidal thoughts 0 - - - -  PHQ-9 Score 2 - - - -  Difficult doing work/chores Somewhat difficult - - - -    Details           Interpretation of Total Score  Total Score Depression Severity:  1-4 = Minimal depression, 5-9 = Mild depression, 10-14 = Moderate depression, 15-19 = Moderately severe depression, 20-27 = Severe depression   Psychosocial Evaluation and Intervention:   Psychosocial Re-Evaluation:  Psychosocial Re-Evaluation     Row Name 08/29/22 1213 09/06/22 1401 10/03/22 1511         Psychosocial Re-Evaluation   Current issues with Current Stress Concerns Current Stress Concerns Current Stress Concerns     Comments Chad Palmer started cardiac rehab on 08/28/22. Chad Palmer did say he was nervous to do any exercise IE walking outsite of cardiac rehab because he did not know what he can and cannot do. Emotional support provided Chad Palmer started cardiac rehab on 08/28/22. Chad Palmer has not not voiced any increase concerns during exercise so far. Exercise is currently on hold.     Expected Outcomes Chad Palmer will have decreased or controlled stress upon completion of intensive cardiac rehab. Chad Palmer will have decreased or controlled stress upon completion of intensive cardiac rehab. Chad Palmer will have decreased or controlled stress upon completion of intensive cardiac rehab.     Interventions Encouraged to attend Cardiac Rehabilitation for the exercise Encouraged to attend Cardiac Rehabilitation for the exercise Encouraged to attend Cardiac Rehabilitation for the exercise     Continue Psychosocial  Services  Follow up required by staff Follow up required by staff Follow up required by staff       Initial Review   Source of Stress Concerns Chronic Illness Chronic Illness Chronic Illness     Comments Will continue to montior and offer support as needed Will continue to montior and offer support as needed Will continue to montior and offer support as needed              Psychosocial Discharge (Final Psychosocial Re-Evaluation):  Psychosocial Re-Evaluation - 10/03/22 1511       Psychosocial Re-Evaluation   Current issues with Current Stress Concerns    Comments Exercise is currently on hold.    Expected Outcomes Chad Palmer will have decreased or controlled stress upon completion of intensive cardiac rehab.    Interventions Encouraged to attend Cardiac Rehabilitation for the exercise    Continue Psychosocial Services  Follow up required by staff      Initial Review   Source of Stress Concerns Chronic Illness    Comments Will continue to montior and offer support as needed  Vocational Rehabilitation: Provide vocational rehab assistance to qualifying candidates.   Vocational Rehab Evaluation & Intervention:  Vocational Rehab - 08/22/22 1438       Initial Vocational Rehab Evaluation & Intervention   Assessment shows need for Vocational Rehabilitation No   Pt is retired, no vocational needs            Education: Education Goals: Education classes will be provided on a weekly basis, covering required topics. Participant will state understanding/return demonstration of topics presented.    Education     Row Name 08/28/22 1500     Education   Cardiac Education Topics Pritikin   Licensed conveyancer Nutrition   Nutrition Facts on Fat   Instruction Review Code 1- Verbalizes Understanding   Class Start Time 1400   Class Stop Time 1440   Class Time Calculation (min) 40 min    Row Name 08/30/22 1500      Education   Cardiac Education Topics Pritikin   Customer service manager   Weekly Topic Fast Evening Meals   Instruction Review Code 1- Verbalizes Understanding   Class Start Time 1400   Class Stop Time 1440   Class Time Calculation (min) 40 min    Row Name 09/01/22 1500     Education   Cardiac Education Topics Pritikin   Licensed conveyancer Nutrition   Nutrition Vitamins and Minerals   Instruction Review Code 1- Verbalizes Understanding   Class Start Time 1400   Class Stop Time 1445   Class Time Calculation (min) 45 min    Row Name 09/04/22 1600     Education   Cardiac Education Topics Pritikin   Geographical information systems officer Psychosocial   Psychosocial Workshop Healthy Sleep for a Healthy Heart   Instruction Review Code 1- Verbalizes Understanding   Class Start Time 1400   Class Stop Time 1455   Class Time Calculation (min) 55 min    Row Name 09/06/22 1600     Education   Cardiac Education Topics Pritikin   Customer service manager   Weekly Topic International Cuisine- Spotlight on the [employer] Zones   Instruction Review Code 1- Verbalizes Understanding   Class Start Time 1400   Class Stop Time 1448   Class Time Calculation (min) 48 min    Row Name 09/08/22 1500     Education   Cardiac Education Topics Pritikin   Psychologist, forensic Exercise Education   Exercise Education Improving Performance   Instruction Review Code 1- Verbalizes Understanding   Class Start Time 1400   Class Stop Time 1455   Class Time Calculation (min) 55 min    Row Name 09/11/22 1500     Education   Cardiac Education Topics Pritikin   Glass blower/designer Nutrition   Nutrition Workshop Fueling a Forensic psychologist    Instruction Review Code 1- Tax inspector   Class Start Time 1400   Class Stop Time 1452   Class Time Calculation (min) 52 min    Row Name 09/13/22 1500  Education   Cardiac Education Topics Pritikin   Customer service manager   Weekly Topic Simple Sides and Sauces   Instruction Review Code 1- Verbalizes Understanding   Class Start Time 1400   Class Stop Time 1445   Class Time Calculation (min) 45 min    Row Name 09/15/22 1400     Education   Cardiac Education Topics Pritikin   Nurse, children's Exercise Physiologist   Select Psychosocial   Psychosocial How Our Thoughts Can Heal Our Hearts   Instruction Review Code 1- Verbalizes Understanding   Class Start Time 1400   Class Stop Time 1450   Class Time Calculation (min) 50 min            Core Videos: Exercise    Move It!  Clinical staff conducted group or individual video education with verbal and written material and guidebook.  Patient learns the recommended Pritikin exercise program. Exercise with the goal of living a long, healthy life. Some of the health benefits of exercise include controlled diabetes, healthier blood pressure levels, improved cholesterol levels, improved heart and lung capacity, improved sleep, and better body composition. Everyone should speak with their doctor before starting or changing an exercise routine.  Biomechanical Limitations Clinical staff conducted group or individual video education with verbal and written material and guidebook.  Patient learns how biomechanical limitations can impact exercise and how we can mitigate and possibly overcome limitations to have an impactful and balanced exercise routine.  Body Composition Clinical staff conducted group or individual video education with verbal and written material and guidebook.  Patient learns that body composition (ratio of muscle mass to fat mass) is  a key component to assessing overall fitness, rather than body weight alone. Increased fat mass, especially visceral belly fat, can put [place] at increased risk for metabolic syndrome, type 2 diabetes, heart disease, and even death. It is recommended to combine diet and exercise (cardiovascular and resistance training) to improve your body composition. Seek guidance from your physician and exercise physiologist before implementing an exercise routine.  Exercise Action Plan Clinical staff conducted group or individual video education with verbal and written material and guidebook.  Patient learns the recommended strategies to achieve and enjoy long-term exercise adherence, including variety, self-motivation, self-efficacy, and positive decision making. Benefits of exercise include fitness, good health, weight management, more energy, better sleep, less stress, and overall well-being.  Medical   Heart Disease Risk Reduction Clinical staff conducted group or individual video education with verbal and written material and guidebook.  Patient learns our heart is our most vital organ as it circulates oxygen, nutrients, white blood cells, and hormones throughout the entire body, and carries waste away. Data supports a plant-based eating plan like the Pritikin Program for its effectiveness in slowing progression of and reversing heart disease. The video provides a number of recommendations to address heart disease.   Metabolic Syndrome and Belly Fat  Clinical staff conducted group or individual video education with verbal and written material and guidebook.  Patient learns what metabolic syndrome is, how it leads to heart disease, and how one can reverse it and keep it from coming back. You have metabolic syndrome if you have 3 of the following 5 criteria: abdominal obesity, high blood pressure, high triglycerides, low HDL cholesterol, and high blood sugar.  Hypertension and Heart Disease Clinical staff  conducted group or individual video  education with verbal and written material and guidebook.  Patient learns that high blood pressure, or hypertension, is very common in the [place]. Hypertension is largely due to excessive salt intake, but other important risk factors include being overweight, physical inactivity, drinking too much alcohol, smoking, and not eating enough potassium from fruits and vegetables. High blood pressure is a leading risk factor for heart attack, stroke, congestive heart failure, dementia, kidney failure, and premature death. Long-term effects of excessive salt intake include stiffening of the arteries and thickening of heart muscle and organ damage. Recommendations include ways to reduce hypertension and the risk of heart disease.  Diseases of Our Time - Focusing on Diabetes Clinical staff conducted group or individual video education with verbal and written material and guidebook.  Patient learns why the best way to stop diseases of our time is prevention, through food and other lifestyle changes. Medicine (such as prescription pills and surgeries) is often only a Band-Aid on the problem, not a long-term solution. Most common diseases of our time include obesity, type 2 diabetes, hypertension, heart disease, and cancer. The Pritikin Program is recommended and has been proven to help reduce, reverse, and/or prevent the damaging effects of metabolic syndrome.  Nutrition   Overview of the Pritikin Eating Plan  Clinical staff conducted group or individual video education with verbal and written material and guidebook.  Patient learns about the Pritikin Eating Plan for disease risk reduction. The Pritikin Eating Plan emphasizes a wide variety of unrefined, minimally-processed carbohydrates, like fruits, vegetables, whole grains, and legumes. Go, Caution, and Stop food choices are explained. Plant-based and lean animal proteins are emphasized. Rationale provided for low sodium  intake for blood pressure control, low added sugars for blood sugar stabilization, and low added fats and oils for coronary artery disease risk reduction and weight management.  Calorie Density  Clinical staff conducted group or individual video education with verbal and written material and guidebook.  Patient learns about calorie density and how it impacts the Pritikin Eating Plan. Knowing the characteristics of the food you choose will help you decide whether those foods will lead to weight gain or weight loss, and whether you want to consume more or less of them. Weight loss is usually a side effect of the Pritikin Eating Plan because of its focus on low calorie-dense foods.  Label Reading  Clinical staff conducted group or individual video education with verbal and written material and guidebook.  Patient learns about the Pritikin recommended label reading guidelines and corresponding recommendations regarding calorie density, added sugars, sodium content, and whole grains.  Dining Out - Part 1  Clinical staff conducted group or individual video education with verbal and written material and guidebook.  Patient learns that restaurant meals can be sabotaging because they can be so high in calories, fat, sodium, and/or sugar. Patient learns recommended strategies on how to positively address this and avoid unhealthy pitfalls.  Facts on Fats  Clinical staff conducted group or individual video education with verbal and written material and guidebook.  Patient learns that lifestyle modifications can be just as effective, if not more so, as many medications for lowering your risk of heart disease. A Pritikin lifestyle can help to reduce your risk of inflammation and atherosclerosis (cholesterol build-up, or plaque, in the artery walls). Lifestyle interventions such as dietary choices and physical activity address the cause of atherosclerosis. A review of the types of fats and their impact on blood  cholesterol levels, along with dietary recommendations to  reduce fat intake is also included.  Nutrition Action Plan  Clinical staff conducted group or individual video education with verbal and written material and guidebook.  Patient learns how to incorporate Pritikin recommendations into their lifestyle. Recommendations include planning and keeping personal health goals in mind as an important part of their success.  Healthy Mind-Set    Healthy Minds, Bodies, Hearts  Clinical staff conducted group or individual video education with verbal and written material and guidebook.  Patient learns how to identify when they are stressed. Video will discuss the impact of that stress, as well as the many benefits of stress management. Patient will also be introduced to stress management techniques. The way we think, act, and feel has an impact on our hearts.  How Our Thoughts Can Heal Our Hearts  Clinical staff conducted group or individual video education with verbal and written material and guidebook.  Patient learns that negative thoughts can cause depression and anxiety. This can result in negative lifestyle behavior and serious health problems. Cognitive behavioral therapy is an effective method to help control our thoughts in order to change and improve our emotional outlook.  Additional Videos:  Exercise    Improving Performance  Clinical staff conducted group or individual video education with verbal and written material and guidebook.  Patient learns to use a non-linear approach by alternating intensity levels and lengths of time spent exercising to help burn more calories and lose more body fat. Cardiovascular exercise helps improve heart health, metabolism, hormonal balance, blood sugar control, and recovery from fatigue. Resistance training improves strength, endurance, balance, coordination, reaction time, metabolism, and muscle mass. Flexibility exercise improves circulation, posture, and  balance. Seek guidance from your physician and exercise physiologist before implementing an exercise routine and learn your capabilities and proper form for all exercise.  Introduction to Yoga  Clinical staff conducted group or individual video education with verbal and written material and guidebook.  Patient learns about yoga, a discipline of the coming together of mind, breath, and body. The benefits of yoga include improved flexibility, improved range of motion, better posture and core strength, increased lung function, weight loss, and positive self-image. Yoga's heart health benefits include lowered blood pressure, healthier heart rate, decreased cholesterol and triglyceride levels, improved immune function, and reduced stress. Seek guidance from your physician and exercise physiologist before implementing an exercise routine and learn your capabilities and proper form for all exercise.  Medical   Aging: Enhancing Your Quality of Life  Clinical staff conducted group or individual video education with verbal and written material and guidebook.  Patient learns key strategies and recommendations to stay in good physical health and enhance quality of life, such as prevention strategies, having an advocate, securing a Health Care Proxy and Power of Attorney, and keeping a list of medications and system for tracking them. It also discusses how to avoid risk for bone loss.  Biology of Weight Control  Clinical staff conducted group or individual video education with verbal and written material and guidebook.  Patient learns that weight gain occurs because we consume more calories than we burn (eating more, moving less). Even if your body weight is normal, you may have higher ratios of fat compared to muscle mass. Too much body fat puts you at increased risk for cardiovascular disease, heart attack, stroke, type 2 diabetes, and obesity-related cancers. In addition to exercise, following the Pritikin Eating  Plan can help reduce your risk.  Decoding Lab Results  Clinical staff conducted group or  individual video education with verbal and written material and guidebook.  Patient learns that lab test reflects one measurement whose values change over time and are influenced by many factors, including medication, stress, sleep, exercise, food, hydration, pre-existing medical conditions, and more. It is recommended to use the knowledge from this video to become more involved with your lab results and evaluate your numbers to speak with your doctor.   Diseases of Our Time - Overview  Clinical staff conducted group or individual video education with verbal and written material and guidebook.  Patient learns that according to the CDC, 50% to 70% of chronic diseases (such as obesity, type 2 diabetes, elevated lipids, hypertension, and heart disease) are avoidable through lifestyle improvements including healthier food choices, listening to satiety cues, and increased physical activity.  Sleep Disorders Clinical staff conducted group or individual video education with verbal and written material and guidebook.  Patient learns how good quality and duration of sleep are important to overall health and well-being. Patient also learns about sleep disorders and how they impact health along with recommendations to address them, including discussing with a physician.  Nutrition  Dining Out - Part 2 Clinical staff conducted group or individual video education with verbal and written material and guidebook.  Patient learns how to plan ahead and communicate in order to maximize their dining experience in a healthy and nutritious manner. Included are recommended food choices based on the type of restaurant the patient is visiting.   Fueling a Banker conducted group or individual video education with verbal and written material and guidebook.  There is a strong connection between our food choices  and our health. Diseases like obesity and type 2 diabetes are very prevalent and are in large-part due to lifestyle choices. The Pritikin Eating Plan provides plenty of food and hunger-curbing satisfaction. It is easy to follow, affordable, and helps reduce health risks.  Menu Workshop  Clinical staff conducted group or individual video education with verbal and written material and guidebook.  Patient learns that restaurant meals can sabotage health goals because they are often packed with calories, fat, sodium, and sugar. Recommendations include strategies to plan ahead and to communicate with the manager, chef, or server to help order a healthier meal.  Planning Your Eating Strategy  Clinical staff conducted group or individual video education with verbal and written material and guidebook.  Patient learns about the Pritikin Eating Plan and its benefit of reducing the risk of disease. The Pritikin Eating Plan does not focus on calories. Instead, it emphasizes high-quality, nutrient-rich foods. By knowing the characteristics of the foods, we choose, we can determine their calorie density and make informed decisions.  Targeting Your Nutrition Priorities  Clinical staff conducted group or individual video education with verbal and written material and guidebook.  Patient learns that lifestyle habits have a tremendous impact on disease risk and progression. This video provides eating and physical activity recommendations based on your personal health goals, such as reducing LDL cholesterol, losing weight, preventing or controlling type 2 diabetes, and reducing high blood pressure.  Vitamins and Minerals  Clinical staff conducted group or individual video education with verbal and written material and guidebook.  Patient learns different ways to obtain key vitamins and minerals, including through a recommended healthy diet. It is important to discuss all supplements you take with your  doctor.   Healthy Mind-Set    Smoking Cessation  Clinical staff conducted group or individual video education with verbal  and written material and guidebook.  Patient learns that cigarette smoking and tobacco addiction pose a serious health risk which affects millions of people. Stopping smoking will significantly reduce the risk of heart disease, lung disease, and many forms of cancer. Recommended strategies for quitting are covered, including working with your doctor to develop a successful plan.  Culinary   Becoming a Set designer conducted group or individual video education with verbal and written material and guidebook.  Patient learns that cooking at home can be healthy, cost-effective, quick, and puts them in control. Keys to cooking healthy recipes will include looking at your recipe, assessing your equipment needs, planning ahead, making it simple, choosing cost-effective seasonal ingredients, and limiting the use of added fats, salts, and sugars.  Cooking - Breakfast and Snacks  Clinical staff conducted group or individual video education with verbal and written material and guidebook.  Patient learns how important breakfast is to satiety and nutrition through the entire day. Recommendations include key foods to eat during breakfast to help stabilize blood sugar levels and to prevent overeating at meals later in the day. Planning ahead is also a key component.  Cooking - Educational psychologist conducted group or individual video education with verbal and written material and guidebook.  Patient learns eating strategies to improve overall health, including an approach to cook more at home. Recommendations include thinking of animal protein as a side on your plate rather than center stage and focusing instead on lower calorie dense options like vegetables, fruits, whole grains, and plant-based proteins, such as beans. Making sauces in large quantities to freeze  for later and leaving the skin on your vegetables are also recommended to maximize your experience.  Cooking - Healthy Salads and Dressing Clinical staff conducted group or individual video education with verbal and written material and guidebook.  Patient learns that vegetables, fruits, whole grains, and legumes are the foundations of the Pritikin Eating Plan. Recommendations include how to incorporate each of these in flavorful and healthy salads, and how to create homemade salad dressings. Proper handling of ingredients is also covered. Cooking - Soups and [employer] - Soups and Desserts Clinical staff conducted group or individual video education with verbal and written material and guidebook.  Patient learns that Pritikin soups and desserts make for easy, nutritious, and delicious snacks and meal components that are low in sodium, fat, sugar, and calorie density, while high in vitamins, minerals, and filling fiber. Recommendations include simple and healthy ideas for soups and desserts.   Overview     The Pritikin Solution Program Overview Clinical staff conducted group or individual video education with verbal and written material and guidebook.  Patient learns that the results of the Pritikin Program have been documented in more than 100 articles published in peer-reviewed journals, and the benefits include reducing risk factors for (and, in some cases, even reversing) high cholesterol, high blood pressure, type 2 diabetes, obesity, and more! An overview of the three key pillars of the Pritikin Program will be covered: eating well, doing regular exercise, and having a healthy mind-set.  WORKSHOPS  Exercise: Exercise Basics: Building Your Action Plan Clinical staff led group instruction and group discussion with PowerPoint presentation and patient guidebook. To enhance the learning environment the use of posters, models and videos may be added. At the conclusion of this workshop,  patients will comprehend the difference between physical activity and exercise, as well as the benefits of incorporating both, into  their routine. Patients will understand the FITT (Frequency, Intensity, Time, and Type) principle and how to use it to build an exercise action plan. In addition, safety concerns and other considerations for exercise and cardiac rehab will be addressed by the presenter. The purpose of this lesson is to promote a comprehensive and effective weekly exercise routine in order to improve patients' overall level of fitness.   Managing Heart Disease: Your Path to a Healthier Heart Clinical staff led group instruction and group discussion with PowerPoint presentation and patient guidebook. To enhance the learning environment the use of posters, models and videos may be added.At the conclusion of this workshop, patients will understand the anatomy and physiology of the heart. Additionally, they will understand how Pritikin's three pillars impact the risk factors, the progression, and the management of heart disease.  The purpose of this lesson is to provide a high-level overview of the heart, heart disease, and how the Pritikin lifestyle positively impacts risk factors.  Exercise Biomechanics Clinical staff led group instruction and group discussion with PowerPoint presentation and patient guidebook. To enhance the learning environment the use of posters, models and videos may be added. Patients will learn how the structural parts of their bodies function and how these functions impact their daily activities, movement, and exercise. Patients will learn how to promote a neutral spine, learn how to manage pain, and identify ways to improve their physical movement in order to promote healthy living. The purpose of this lesson is to expose patients to common physical limitations that impact physical activity. Participants will learn practical ways to adapt and manage aches and  pains, and to minimize their effect on regular exercise. Patients will learn how to maintain good posture while sitting, walking, and lifting.  Balance Training and Fall Prevention  Clinical staff led group instruction and group discussion with PowerPoint presentation and patient guidebook. To enhance the learning environment the use of posters, models and videos may be added. At the conclusion of this workshop, patients will understand the importance of their sensorimotor skills (vision, proprioception, and the vestibular system) in maintaining their ability to balance as they age. Patients will apply a variety of balancing exercises that are appropriate for their current level of function. Patients will understand the common causes for poor balance, possible solutions to these problems, and ways to modify their physical environment in order to minimize their fall risk. The purpose of this lesson is to teach patients about the importance of maintaining balance as they age and ways to minimize their risk of falling.  WORKSHOPS   Nutrition:  Fueling a Ship broker led group instruction and group discussion with PowerPoint presentation and patient guidebook. To enhance the learning environment the use of posters, models and videos may be added. Patients will review the foundational principles of the Pritikin Eating Plan and understand what constitutes a serving size in each of the food groups. Patients will also learn Pritikin-friendly foods that are better choices when away from home and review make-ahead meal and snack options. Calorie density will be reviewed and applied to three nutrition priorities: weight maintenance, weight loss, and weight gain. The purpose of this lesson is to reinforce (in a group setting) the key concepts around what patients are recommended to eat and how to apply these guidelines when away from home by planning and selecting Pritikin-friendly options.  Patients will understand how calorie density may be adjusted for different weight management goals.  Mindful Eating  Clinical staff led group  instruction and group discussion with PowerPoint presentation and patient guidebook. To enhance the learning environment the use of posters, models and videos may be added. Patients will briefly review the concepts of the Pritikin Eating Plan and the importance of low-calorie dense foods. The concept of mindful eating will be introduced as well as the importance of paying attention to internal hunger signals. Triggers for non-hunger eating and techniques for dealing with triggers will be explored. The purpose of this lesson is to provide patients with the opportunity to review the basic principles of the Pritikin Eating Plan, discuss the value of eating mindfully and how to measure internal cues of hunger and fullness using the Hunger Scale. Patients will also discuss reasons for non-hunger eating and learn strategies to use for controlling emotional eating.  Targeting Your Nutrition Priorities Clinical staff led group instruction and group discussion with PowerPoint presentation and patient guidebook. To enhance the learning environment the use of posters, models and videos may be added. Patients will learn how to determine their genetic susceptibility to disease by reviewing their family history. Patients will gain insight into the importance of diet as part of an overall healthy lifestyle in mitigating the impact of genetics and other environmental insults. The purpose of this lesson is to provide patients with the opportunity to assess their personal nutrition priorities by looking at their family history, their own health history and current risk factors. Patients will also be able to discuss ways of prioritizing and modifying the Pritikin Eating Plan for their highest risk areas  Menu  Clinical staff led group instruction and group discussion with PowerPoint  presentation and patient guidebook. To enhance the learning environment the use of posters, models and videos may be added. Using menus brought in from [employer], or printed from [employer], patients will apply the Pritikin dining out guidelines that were presented in the [employer] video. Patients will also be able to practice these guidelines in a variety of provided scenarios. The purpose of this lesson is to provide patients with the opportunity to practice hands-on learning of the Pritikin Dining Out guidelines with actual menus and practice scenarios.  Label Reading Clinical staff led group instruction and group discussion with PowerPoint presentation and patient guidebook. To enhance the learning environment the use of posters, models and videos may be added. Patients will review and discuss the Pritikin label reading guidelines presented in Pritikin's Label Reading Educational series video. Using fool labels brought in from local grocery stores and markets, patients will apply the label reading guidelines and determine if the packaged food meet the Pritikin guidelines. The purpose of this lesson is to provide patients with the opportunity to review, discuss, and practice hands-on learning of the Pritikin Label Reading guidelines with actual packaged food labels. Cooking School  Pritikin's [employer] are designed to teach patients ways to prepare quick, simple, and affordable recipes at home. The importance of nutrition's role in chronic disease risk reduction is reflected in its emphasis in the overall Pritikin program. By learning how to prepare essential core Pritikin Eating Plan recipes, patients will increase control over what they eat; be able to customize the flavor of foods without the use of added salt, sugar, or fat; and improve the quality of the food they consume. By learning a set of core recipes which are easily assembled, quickly prepared, and  affordable, patients are more likely to prepare more healthy foods at home. These workshops focus on convenient breakfasts, simple entres,  side dishes, and desserts which can be prepared with minimal effort and are consistent with nutrition recommendations for cardiovascular risk reduction. Cooking [employer] are taught by a Armed forces logistics/support/administrative officer (RD) who has been trained by the [employer]. The chef or RD has a clear understanding of the importance of minimizing - if not completely eliminating - added fat, sugar, and sodium in recipes. Throughout the series of Cooking School Workshop sessions, patients will learn about healthy ingredients and efficient methods of cooking to build confidence in their capability to prepare    Cooking School weekly topics:  Adding Flavor- Sodium-Free  Fast and Healthy Breakfasts  Powerhouse Plant-Based Proteins  Satisfying Salads and Dressings  Simple Sides and Sauces  International Cuisine-Spotlight on the [employer] Zones  Delicious Desserts  Savory Soups  [employer] - Meals in a Astronomer Appetizers and Snacks  Comforting Weekend Breakfasts  One-Pot Wonders   Fast Evening Meals  Landscape architect Your Pritikin Plate  WORKSHOPS   Healthy Mindset (Psychosocial):  Focused Goals, Sustainable Changes Clinical staff led group instruction and group discussion with PowerPoint presentation and patient guidebook. To enhance the learning environment the use of posters, models and videos may be added. Patients will be able to apply effective goal setting strategies to establish at least one personal goal, and then take consistent, meaningful action toward that goal. They will learn to identify common barriers to achieving personal goals and develop strategies to overcome them. Patients will also gain an understanding of how our mind-set can impact our ability to achieve goals and the importance of cultivating a positive  and growth-oriented mind-set. The purpose of this lesson is to provide patients with a deeper understanding of how to set and achieve personal goals, as well as the tools and strategies needed to overcome common obstacles which may arise along the way.  From Head to Heart: The Power of a Healthy Outlook  Clinical staff led group instruction and group discussion with PowerPoint presentation and patient guidebook. To enhance the learning environment the use of posters, models and videos may be added. Patients will be able to recognize and describe the impact of emotions and mood on physical health. They will discover the importance of self-care and explore self-care practices which may work for them. Patients will also learn how to utilize the 4 C's to cultivate a healthier outlook and better manage stress and challenges. The purpose of this lesson is to demonstrate to patients how a healthy outlook is an essential part of maintaining good health, especially as they continue their cardiac rehab journey.  Healthy Sleep for a Healthy Heart Clinical staff led group instruction and group discussion with PowerPoint presentation and patient guidebook. To enhance the learning environment the use of posters, models and videos may be added. At the conclusion of this workshop, patients will be able to demonstrate knowledge of the importance of sleep to overall health, well-being, and quality of life. They will understand the symptoms of, and treatments for, common sleep disorders. Patients will also be able to identify daytime and nighttime behaviors which impact sleep, and they will be able to apply these tools to help manage sleep-related challenges. The purpose of this lesson is to provide patients with a general overview of sleep and outline the importance of quality sleep. Patients will learn about a few of the most common sleep disorders. Patients will also be introduced to the concept of "sleep hygiene," and  discover ways to  self-manage certain sleeping problems through simple daily behavior changes. Finally, the workshop will motivate patients by clarifying the links between quality sleep and their goals of heart-healthy living.   Recognizing and Reducing Stress Clinical staff led group instruction and group discussion with PowerPoint presentation and patient guidebook. To enhance the learning environment the use of posters, models and videos may be added. At the conclusion of this workshop, patients will be able to understand the types of stress reactions, differentiate between acute and chronic stress, and recognize the impact that chronic stress has on their health. They will also be able to apply different coping mechanisms, such as reframing negative self-talk. Patients will have the opportunity to practice a variety of stress management techniques, such as deep abdominal breathing, progressive muscle relaxation, and/or guided imagery.  The purpose of this lesson is to educate patients on the role of stress in their lives and to provide healthy techniques for coping with it.  Learning Barriers/Preferences:  Learning Barriers/Preferences - 08/22/22 1438       Learning Barriers/Preferences   Learning Barriers Sight    Learning Preferences Audio;Computer/Internet;Group Instruction;Individual Instruction;Pictoral;Skilled Demonstration;Verbal Instruction;Video;Written Material             Education Topics:  Knowledge Questionnaire Score:  Knowledge Questionnaire Score - 08/22/22 1514       Knowledge Questionnaire Score   Pre Score 20/24             Core Components/Risk Factors/Patient Goals at Admission:  Personal Goals and Risk Factors at Admission - 08/22/22 1438       Core Components/Risk Factors/Patient Goals on Admission   Hypertension Yes    Intervention Provide education on lifestyle modifcations including regular physical activity/exercise, weight management, moderate  sodium restriction and increased consumption of fresh fruit, vegetables, and low fat dairy, alcohol moderation, and smoking cessation.;Monitor prescription use compliance.    Expected Outcomes Short Term: Continued assessment and intervention until BP is < 140/61mm HG in hypertensive participants. < 130/51mm HG in hypertensive participants with diabetes, heart failure or chronic kidney disease.;Long Term: Maintenance of blood pressure at goal levels.    Lipids Yes    Intervention Provide education and support for participant on nutrition & aerobic/resistive exercise along with prescribed medications to achieve LDL 70mg , HDL >40mg .    Expected Outcomes Short Term: Participant states understanding of desired cholesterol values and is compliant with medications prescribed. Participant is following exercise prescription and nutrition guidelines.;Long Term: Cholesterol controlled with medications as prescribed, with individualized exercise RX and with personalized nutrition plan. Value goals: LDL < 70mg , HDL > 40 mg.             Core Components/Risk Factors/Patient Goals Review:   Goals and Risk Factor Review     Row Name 08/29/22 1217 09/06/22 1409 10/03/22 1516         Core Components/Risk Factors/Patient Goals Review   Personal Goals Review Weight Management/Obesity;Hypertension;Lipids;Stress Weight Management/Obesity;Hypertension;Lipids;Stress Weight Management/Obesity;Hypertension;Lipids;Stress     Review Chad Palmer started cardiac rehab on 08/28/22 and did well with exercise. Vital signs were stable Chad Palmer started cardiac rehab on 08/28/22. Chad Palmer has been doing well with exercise. Chad Palmer did report feeling lightheaded post exercise on 09/04/22. Encourged to increase fluid intake and to eat before coming to class. Chad Palmer's last day of  cardiac rehab was on 09/15/22. Exercise is currently on hold     Expected Outcomes Chad Palmer will continue to participate in intensive cardiac rehab for exercise, nutrition and lifestyle  modifications Chad Palmer will continue to participate in  intensive cardiac rehab for exercise, nutrition and lifestyle modifications Chad Palmer will continue to participate in intensive cardiac rehab for exercise, nutrition and lifestyle modifications              Core Components/Risk Factors/Patient Goals at Discharge (Final Review):   Goals and Risk Factor Review - 10/03/22 1516       Core Components/Risk Factors/Patient Goals Review   Personal Goals Review Weight Management/Obesity;Hypertension;Lipids;Stress    Review Chad Palmer's last day of  cardiac rehab was on 09/15/22. Exercise is currently on hold    Expected Outcomes Chad Palmer will continue to participate in intensive cardiac rehab for exercise, nutrition and lifestyle modifications             ITP Comments:  ITP Comments     Row Name 08/22/22 1300 08/29/22 1212 09/06/22 1357 10/03/22 1510     ITP Comments Armanda Magic, MD: Medical Director.  Intoduction to the [employer] / Intensive Cardiac Rehab.  Initial orientation packet reviewed with the patient. 30 day ITP Review. Chad Palmer started cardiac rehab on 08/28/22 and did well with exercise/ 30 day ITP Review. Chad Palmer started cardiac rehab on 08/28/22 and is off to a good start to exercise. Chad Palmer was lightheaded post exercise on 09/04/22. Encouraged to drink more water 30 day ITP Review. Exercise is currently on hold per Dr Tenny Craw.             Comments: See ITP comments.Gladstone Lighter, RN,BSN 10/03/2022 3:19 PM

## 2022-10-04 ENCOUNTER — Encounter (HOSPITAL_COMMUNITY): Payer: Medicare Other

## 2022-10-06 ENCOUNTER — Encounter (HOSPITAL_COMMUNITY): Payer: Medicare Other

## 2022-10-11 ENCOUNTER — Encounter: Payer: Self-pay | Admitting: Internal Medicine

## 2022-10-11 ENCOUNTER — Encounter (HOSPITAL_COMMUNITY): Payer: Medicare Other

## 2022-10-12 ENCOUNTER — Ambulatory Visit: Payer: Medicare Other | Attending: Physician Assistant

## 2022-10-12 DIAGNOSIS — I2510 Atherosclerotic heart disease of native coronary artery without angina pectoris: Secondary | ICD-10-CM

## 2022-10-12 DIAGNOSIS — K219 Gastro-esophageal reflux disease without esophagitis: Secondary | ICD-10-CM

## 2022-10-12 DIAGNOSIS — D649 Anemia, unspecified: Secondary | ICD-10-CM

## 2022-10-12 DIAGNOSIS — I10 Essential (primary) hypertension: Secondary | ICD-10-CM

## 2022-10-12 DIAGNOSIS — Z955 Presence of coronary angioplasty implant and graft: Secondary | ICD-10-CM | POA: Diagnosis not present

## 2022-10-12 DIAGNOSIS — R0600 Dyspnea, unspecified: Secondary | ICD-10-CM

## 2022-10-13 ENCOUNTER — Encounter (HOSPITAL_COMMUNITY): Payer: Medicare Other

## 2022-10-13 LAB — CBC
Hematocrit: 37.5 % (ref 37.5–51.0)
Hemoglobin: 11.1 g/dL — ABNORMAL LOW (ref 13.0–17.7)
MCH: 18.5 pg — ABNORMAL LOW (ref 26.6–33.0)
MCHC: 29.6 g/dL — ABNORMAL LOW (ref 31.5–35.7)
MCV: 63 fL — ABNORMAL LOW (ref 79–97)
Platelets: 212 10*3/uL (ref 150–450)
RBC: 6 x10E6/uL — ABNORMAL HIGH (ref 4.14–5.80)
RDW: 19.1 % — ABNORMAL HIGH (ref 11.6–15.4)
WBC: 5.2 10*3/uL (ref 3.4–10.8)

## 2022-10-13 LAB — HEPATIC FUNCTION PANEL
ALT: 11 IU/L (ref 0–44)
AST: 14 IU/L (ref 0–40)
Albumin: 4.3 g/dL (ref 3.8–4.8)
Alkaline Phosphatase: 78 IU/L (ref 44–121)
Bilirubin Total: 1 mg/dL (ref 0.0–1.2)
Bilirubin, Direct: 0.32 mg/dL (ref 0.00–0.40)
Total Protein: 6.4 g/dL (ref 6.0–8.5)

## 2022-10-13 LAB — FOLATE: Folate: 18.2 ng/mL (ref >3.0)

## 2022-10-13 LAB — IRON AND TIBC
Iron Saturation: 29 % (ref 15–55)
Iron: 65 ug/dL (ref 38–169)
Total Iron Binding Capacity: 222 ug/dL — ABNORMAL LOW (ref 250–450)
UIBC: 157 ug/dL (ref 111–343)

## 2022-10-13 LAB — FERRITIN: Ferritin: 310 ng/mL (ref 30–400)

## 2022-10-13 LAB — VITAMIN B12: Vitamin B-12: 921 pg/mL (ref 232–1245)

## 2022-10-16 ENCOUNTER — Encounter (HOSPITAL_COMMUNITY): Payer: Medicare Other

## 2022-10-18 ENCOUNTER — Encounter: Payer: Self-pay | Admitting: Internal Medicine

## 2022-10-18 ENCOUNTER — Telehealth (HOSPITAL_COMMUNITY): Payer: Self-pay | Admitting: *Deleted

## 2022-10-18 ENCOUNTER — Encounter (HOSPITAL_COMMUNITY): Payer: Medicare Other

## 2022-10-18 NOTE — Telephone Encounter (Signed)
Left message to call cardiac rehab. Exercise is on hold per Dr Tenny Craw.[name] RN BSN

## 2022-10-20 ENCOUNTER — Encounter (HOSPITAL_COMMUNITY): Payer: Medicare Other

## 2022-10-20 ENCOUNTER — Other Ambulatory Visit: Payer: Self-pay | Admitting: Internal Medicine

## 2022-10-23 ENCOUNTER — Encounter (HOSPITAL_COMMUNITY): Payer: Medicare Other

## 2022-10-25 ENCOUNTER — Encounter (HOSPITAL_COMMUNITY): Payer: Medicare Other

## 2022-10-27 ENCOUNTER — Telehealth (HOSPITAL_COMMUNITY): Payer: Self-pay | Admitting: *Deleted

## 2022-10-27 ENCOUNTER — Encounter (HOSPITAL_COMMUNITY): Payer: Medicare Other

## 2022-10-27 NOTE — Telephone Encounter (Signed)
Spoke with Chad Palmer he is going to see an ENT on Monday. Will cancel his appointments for next week. Await clearance from Dr Tenny Craw to return to exercise. Chad Palmer says he is still having dizziness but not as much.[name] RN BSN

## 2022-10-30 ENCOUNTER — Encounter (HOSPITAL_COMMUNITY): Payer: Medicare Other

## 2022-10-30 ENCOUNTER — Encounter (INDEPENDENT_AMBULATORY_CARE_PROVIDER_SITE_OTHER): Payer: Self-pay | Admitting: Otolaryngology

## 2022-10-30 ENCOUNTER — Ambulatory Visit (INDEPENDENT_AMBULATORY_CARE_PROVIDER_SITE_OTHER): Payer: Medicare Other | Admitting: Otolaryngology

## 2022-10-30 ENCOUNTER — Institutional Professional Consult (permissible substitution) (INDEPENDENT_AMBULATORY_CARE_PROVIDER_SITE_OTHER): Payer: Medicare Other | Admitting: Otolaryngology

## 2022-10-30 VITALS — BP 149/81 | HR 88 | Ht 68.0 in | Wt 171.6 lb

## 2022-10-30 DIAGNOSIS — H60543 Acute eczematoid otitis externa, bilateral: Secondary | ICD-10-CM

## 2022-10-30 DIAGNOSIS — R42 Dizziness and giddiness: Secondary | ICD-10-CM | POA: Diagnosis not present

## 2022-10-30 DIAGNOSIS — J300 Vasomotor rhinitis: Secondary | ICD-10-CM | POA: Diagnosis not present

## 2022-10-30 DIAGNOSIS — H9193 Unspecified hearing loss, bilateral: Secondary | ICD-10-CM

## 2022-10-30 DIAGNOSIS — R0982 Postnasal drip: Secondary | ICD-10-CM | POA: Diagnosis not present

## 2022-10-30 DIAGNOSIS — H938X3 Other specified disorders of ear, bilateral: Secondary | ICD-10-CM

## 2022-10-30 MED ORDER — OFLOXACIN 0.3 % OP SOLN: 4.0000 [drp] | Freq: Four times a day (QID) | OPHTHALMIC | 1 refills | Status: DC

## 2022-10-30 MED ORDER — IPRATROPIUM BROMIDE 0.03 % NA SOLN: 2.0000 | Freq: Two times a day (BID) | NASAL | 12 refills | Status: DC | PRN

## 2022-10-30 NOTE — Progress Notes (Unsigned)
Otolaryngology Clinic Note Referring physician:  Dr. Posey Rea HPI:  Chad Palmer is a 77 y.o. male kindly referred by Dr. Posey Rea for evaluation of multiple complaints, principal of which is lightheadedness and concern for vertigo. Started after he had stents placed in June for Cardiac Ischemia. Since then, he has felt lightheaded. He reports that what he describes as "dizziness" is actually lightheadedness. He reports that it often occurs when he "gets up" or "unsteady" but denies Nausea or vomiting. Most of the time this lasts for 20-30 minutes, where he has to sit down and drink water. Every once in a while, he will lay down but it resolves by itself. He does report that his hearing is a bit muffled, and his ears also itch. He will occasionally get a sharp pain, and occassionally will have ringing (once every couple of weeks). Tinnitus is not pulsatile. He does use Q-tips every day. Pain and ringing lasts about 4-5 minutes. No drainage from the ear, pressure in the ear.  Denies prior ear infections, surgery, barotrauma, vestibular suppressant use. He denies other eustachian tube symptoms such as pressure In ear, popping, symptoms brought about by pressure changes. Does have hearing loss (chronic) as he used to work on [employer].   He also reports some nasal dripping after he eats. Does not matter which kind of foods. He does flonase regularly for allergies (every morning) but otherwise no other medications for sinuses. Occasional PO antihistamine. Denies recent sinus infections, hyposmia, sinus pressure or pain, anterior rhinorrhea.  No recent sinus CT; MRI Head 10/02/2022: No obvious mastoid effusions noted, evaluation limited given character of exam.   PMHx: BPH, Kidney Cancer (only one kidney), Cervical spine stenosis, HTN, HLD, CADs/p PTC/DES to LAD (on Plavix and ASA), GERD.    PMH/Meds/All/SocHx/FamHx/ROS:   Past Medical History:  Diagnosis Date   Allergic rhinitis    Arthritis     BPH (benign prostatic hyperplasia)    Cancer (HCC)    kidney    Cataract    bilateral    Cervical spinal stenosis    Dr Dohmier   Colon polyp    Diverticulosis of colon    ED (erectile dysfunction)    Elevated PSA    GERD (gastroesophageal reflux disease)    Hemorrhoids    HTN (hypertension)    Microcytosis    Denies heart/lung/kidney issues, diabetes, previous diagnosis of cancer***  Past Surgical History:  Procedure Laterality Date   cataract surg     BIL   CORONARY CTO INTERVENTION N/A 08/01/2022   Procedure: CORONARY CTO INTERVENTION;  Surgeon: Orbie Pyo, MD;  Location: [facility];  Service: Cardiovascular;  Laterality: N/A;   CORONARY ULTRASOUND/IVUS N/A 08/01/2022   Procedure: Coronary Ultrasound/IVUS;  Surgeon: Orbie Pyo, MD;  Location: [facility];  Service: Cardiovascular;  Laterality: N/A;   HEMORRHOID SURGERY     LAPAROSCOPIC NEPHRECTOMY Left 12/22/2019   Procedure: LAPAROSCOPIC RADICAL NEPHRECTOMY;  Surgeon: Heloise Purpura, MD;  Location: [facility];  Service: Urology;  Laterality: Left;   LEFT HEART CATH AND CORONARY ANGIOGRAPHY N/A 08/01/2022   Procedure: LEFT HEART CATH AND CORONARY ANGIOGRAPHY;  Surgeon: Orbie Pyo, MD;  Location: [facility];  Service: Cardiovascular;  Laterality: N/A;   TOENAIL EXCISION  2009   ALL   TONSILLECTOMY     VASECTOMY     WISDOM TOOTH EXTRACTION     Denies history of head or neck surgery. ***  Family History  Problem Relation Age of  Onset   Alzheimer's disease Mother    Heart disease Father    Alzheimer's disease Brother    Hypertension Other    Colon cancer Neg Hx    Esophageal cancer Neg Hx    Stomach cancer Neg Hx    Rectal cancer Neg Hx    No family history of bleeding disorders or difficulty with anesthesia.***  Social Connections: Unknown (05/12/2022)   Social Connection and Isolation Panel [NHANES]    Frequency of Communication with Friends and Family: Three times a week     Frequency of Social Gatherings with Friends and Family: Once a week    Attends Religious Services: Patient declined    Database administrator or Organizations: No    Attends Engineer, structural: 1 to 4 times per year    Marital Status: Married    Tobacco: ***. Alcohol: ***. Occupation: ***. Lives in *** with ***.   Current Outpatient Medications:    aspirin EC (ASPIRIN LOW DOSE) 81 MG tablet, TAKE 1 TABLET BY MOUTH DAILY SWALLOW WHOLE, Disp: 90 tablet, Rfl: 3   atorvastatin (LIPITOR) 40 MG tablet, Take 1 tablet (40 mg total) by mouth daily., Disp: 90 tablet, Rfl: 1   clopidogrel (PLAVIX) 75 MG tablet, Take 1 tablet (75 mg total) by mouth daily., Disp: 30 tablet, Rfl: 6   co-enzyme Q-10 30 MG capsule, Take 30 mg by mouth daily., Disp: , Rfl:    finasteride (PROSCAR) 5 MG tablet, Take 1 tablet (5 mg total) by mouth daily., Disp: 90 tablet, Rfl: 3   fluticasone (FLONASE) 50 MCG/ACT nasal spray, USE 2 SPRAYS IN EACH NOSTRIL DAILY, Disp: 16 mL, Rfl: 0   folic acid (FOLVITE) 1 MG tablet, Take 1 tablet (1 mg total) by mouth daily., Disp: 90 tablet, Rfl: 1   Histamine Dihydrochloride (AUSTRALIAN DREAM ARTHRITIS EX), Apply 1 Application topically daily as needed (pain)., Disp: , Rfl:    lidocaine (LIDODERM) 5 %, Place 1 patch onto the skin daily as needed (pain). Remove & Discard patch within 12 hours or as directed by MD, Disp: , Rfl:    nitroGLYCERIN (NITROSTAT) 0.4 MG SL tablet, DISSOLVE 1 TAB UNDER TONGUE FOR CHEST PAIN - IF PAIN REMAINS AFTER 5 MIN, CALL 911 AND REPEAT DOSE. MAX 3 TABS IN 15 MINUTES, Disp: 25 tablet, Rfl: 0   olmesartan (BENICAR) 20 MG tablet, Take 0.5 tablets (10 mg total) by mouth daily., Disp: 45 tablet, Rfl: 3   sildenafil (VIAGRA) 100 MG tablet, Take 50 mg by mouth daily as needed for erectile dysfunction., Disp: , Rfl:    zolpidem (AMBIEN) 10 MG tablet, Take 1 tablet (10 mg total) by mouth at bedtime., Disp: 90 tablet, Rfl: 1   Calcium-Vitamin D-Vitamin K (CALCIUM  + D + K PO), Take 1 tablet by mouth daily. (Patient not taking: Reported on 10/30/2022), Disp: , Rfl:    Cyanocobalamin (B-12) 2500 MCG TABS, Take 2,500 mcg by mouth daily. (Patient not taking: Reported on 10/30/2022), Disp: , Rfl:    Magnesium 250 MG TABS, Take 250 mg by mouth daily. (Patient not taking: Reported on 10/30/2022), Disp: , Rfl:    pantoprazole (PROTONIX) 40 MG tablet, TAKE 1 TABLET BY MOUTH DAILY (Patient not taking: Reported on 10/30/2022), Disp: 90 tablet, Rfl: 3   Probiotic Product (ALIGN) 4 MG CAPS, Take 4 mg by mouth at bedtime. (Patient not taking: Reported on 10/30/2022), Disp: , Rfl:   A 20-point ROS was performed with pertinent positives/negatives noted in the HPI.  The remainder of the ROS are ***   Physical Exam:   BP (!) 149/81 (BP Location: Right Arm, Patient Position: Sitting, Cuff Size: Normal)   Pulse 88   Ht 5\' 8"  (1.727 m)   Wt 171 lb 9.6 oz (77.8 kg)   SpO2 99%   BMI 26.09 kg/m  ***  Salient findings:  CN II-XII intact *** Bilateral EAC clear and TM intact with well pneumatized middle ear spaces Weber 512: Rinne 512: AC > BC b/l *** Rine 1024: AC > BC b/l *** Anterior rhinoscopy: Septum ***; bilateral inferior turbinates with *** No lesions of oral cavity/oropharynx; dentition *** No obviously palpable neck masses/lymphadenopathy/thyromegaly No respiratory distress or stridor***  Independent Review of Additional Tests or Records:  None***  Procedures:  None***  Impression & Plans:  Chad Palmer is a 76 y.o. male with ***    Thank you for allowing me the opportunity to care for your patient. Please do not hesitate to contact me should you have any other questions.  Sincerely, Jovita Kussmaul, MD Otolarynoglogist (ENT), [facility] Phone: [phone] Fax: [phone]  10/30/2022, 11:00 AM

## 2022-10-30 NOTE — Addendum Note (Signed)
Encounter addended by: [place], Shamaya Kauer G, RD on: 10/30/2022 4:20 PM  Actions taken: Flowsheet data copied forward, Flowsheet accepted

## 2022-11-01 ENCOUNTER — Encounter (HOSPITAL_COMMUNITY): Payer: Medicare Other

## 2022-11-01 ENCOUNTER — Encounter (HOSPITAL_COMMUNITY): Payer: Self-pay | Admitting: *Deleted

## 2022-11-01 DIAGNOSIS — Z955 Presence of coronary angioplasty implant and graft: Secondary | ICD-10-CM

## 2022-11-01 NOTE — Progress Notes (Signed)
Cardiac Individual Treatment Plan  Patient Details  Name: Chad Palmer MRN: [id number] Date of Birth: 01/11/1946 Referring Provider:   Flowsheet Row INTENSIVE CARDIAC REHAB ORIENT from 08/22/2022 in [facility]  Referring Provider Dietrich Pates, MD       Initial Encounter Date:  Flowsheet Row INTENSIVE CARDIAC REHAB ORIENT from 08/22/2022 in [facility], V[facility], [facility]  Date 08/22/22       Visit Diagnosis: 08/01/22 Status post coronary artery stent placement LAD  Patient's Home Medications on Admission:  Current Outpatient Medications:    aspirin EC (ASPIRIN LOW DOSE) 81 MG tablet, TAKE 1 TABLET BY MOUTH DAILY SWALLOW WHOLE, Disp: 90 tablet, Rfl: 3   atorvastatin (LIPITOR) 40 MG tablet, Take 1 tablet (40 mg total) by mouth daily., Disp: 90 tablet, Rfl: 1   Calcium-Vitamin D-Vitamin K (CALCIUM + D + K PO), Take 1 tablet by mouth daily. (Patient not taking: Reported on 10/30/2022), Disp: , Rfl:    clopidogrel (PLAVIX) 75 MG tablet, Take 1 tablet (75 mg total) by mouth daily., Disp: 30 tablet, Rfl: 6   co-enzyme Q-10 30 MG capsule, Take 30 mg by mouth daily., Disp: , Rfl:    Cyanocobalamin (B-12) 2500 MCG TABS, Take 2,500 mcg by mouth daily. (Patient not taking: Reported on 10/30/2022), Disp: , Rfl:    finasteride (PROSCAR) 5 MG tablet, Take 1 tablet (5 mg total) by mouth daily., Disp: 90 tablet, Rfl: 3   fluticasone (FLONASE) 50 MCG/ACT nasal spray, USE 2 SPRAYS IN EACH NOSTRIL DAILY, Disp: 16 mL, Rfl: 0   folic acid (FOLVITE) 1 MG tablet, Take 1 tablet (1 mg total) by mouth daily., Disp: 90 tablet, Rfl: 1   Histamine Dihydrochloride (AUSTRALIAN DREAM ARTHRITIS EX), Apply 1 Application topically daily as needed (pain)., Disp: , Rfl:    ipratropium (ATROVENT) 0.03 % nasal spray, Place 2 sprays into both nostrils 2 (two) times daily as needed for rhinitis., Disp: 30 mL, Rfl: 12   lidocaine  (LIDODERM) 5 %, Place 1 patch onto the skin daily as needed (pain). Remove & Discard patch within 12 hours or as directed by MD, Disp: , Rfl:    Magnesium 250 MG TABS, Take 250 mg by mouth daily. (Patient not taking: Reported on 10/30/2022), Disp: , Rfl:    nitroGLYCERIN (NITROSTAT) 0.4 MG SL tablet, DISSOLVE 1 TAB UNDER TONGUE FOR CHEST PAIN - IF PAIN REMAINS AFTER 5 MIN, CALL 911 AND REPEAT DOSE. MAX 3 TABS IN 15 MINUTES, Disp: 25 tablet, Rfl: 0   ofloxacin (OCUFLOX) 0.3 % ophthalmic solution, Place 4 drops into both eyes 4 (four) times daily. Put in both ears - twice daily (4 drops) for 1 week, Disp: 5 mL, Rfl: 1   olmesartan (BENICAR) 20 MG tablet, Take 0.5 tablets (10 mg total) by mouth daily., Disp: 45 tablet, Rfl: 3   pantoprazole (PROTONIX) 40 MG tablet, TAKE 1 TABLET BY MOUTH DAILY (Patient not taking: Reported on 10/30/2022), Disp: 90 tablet, Rfl: 3   Probiotic Product (ALIGN) 4 MG CAPS, Take 4 mg by mouth at bedtime. (Patient not taking: Reported on 10/30/2022), Disp: , Rfl:    sildenafil (VIAGRA) 100 MG tablet, Take 50 mg by mouth daily as needed for erectile dysfunction., Disp: , Rfl:    zolpidem (AMBIEN) 10 MG tablet, Take 1 tablet (10 mg total) by mouth at bedtime., Disp: 90 tablet, Rfl: 1  Past Medical History: Past Medical History:  Diagnosis Date   Allergic rhinitis    Arthritis    BPH (benign prostatic hyperplasia)    Cancer (HCC)    kidney    Cataract    bilateral    Cervical spinal stenosis    Dr Dohmier   Colon polyp    Diverticulosis of colon    ED (erectile dysfunction)    Elevated PSA    GERD (gastroesophageal reflux disease)    Hemorrhoids    HTN (hypertension)    Microcytosis     Tobacco Use: Social History   Tobacco Use  Smoking Status Never  Smokeless Tobacco Never    Labs: Review Flowsheet  More data exists      Latest Ref Rng & Units 07/03/2019 02/19/2020 01/26/2021 05/16/2022 09/25/2022  Labs for ITP Cardiac and Pulmonary Rehab  Cholestrol 0 - 200  mg/dL 782  - 956  213  -  LDL (calc) 0 - 99 mg/dL 086  - 578  469  -  HDL-C >39.00 mg/dL 62.95  - 28.41  32.44  -  Trlycerides 0.0 - 149.0 mg/dL 01.0  - 272.5  366.4  -  Hemoglobin A1c 4.8 - 5.6 % - 5.9  - - 6.1     Details            Capillary Blood Glucose: Lab Results  Component Value Date   GLUCAP 107 (H) 09/04/2022   GLUCAP 92 11/21/2019     Exercise Target Goals: Exercise Program Goal: Individual exercise prescription set using results from initial 6 min walk test and THRR while considering  patient's activity barriers and safety.   Exercise Prescription Goal: Initial exercise prescription builds to 30-45 minutes a day of aerobic activity, 2-3 days per week.  Home exercise guidelines will be given to patient during program as part of exercise prescription that the participant will acknowledge.  Activity Barriers & Risk Stratification:   6 Minute Walk:   Oxygen Initial Assessment:   Oxygen Re-Evaluation:   Oxygen Discharge (Final Oxygen Re-Evaluation):   Initial Exercise Prescription:   Perform Capillary Blood Glucose checks as needed.  Exercise Prescription Changes:   Exercise Prescription Changes     Row Name 09/08/22 1640             Response to Exercise   Blood Pressure (Admit) 128/62       Blood Pressure (Exercise) 122/66       Blood Pressure (Exit) 102/50       Heart Rate (Admit) 77 bpm       Heart Rate (Exercise) 114 bpm       Heart Rate (Exit) 86 bpm       Rating of Perceived Exertion (Exercise) 9.5       Perceived Dyspnea (Exercise) 0       Symptoms 0       Comments Reviewed MET's, goals and home ExRx       Duration Progress to 30 minutes of  aerobic without signs/symptoms of physical distress       Intensity THRR unchanged         Progression   Progression Continue to progress workloads to maintain intensity without signs/symptoms of physical distress.       Average METs 3.52         Resistance Training   Training  Prescription Yes       Weight 3 lbs       Reps 10-15       Time 10 Minutes  Recumbant Elliptical   Level 2       Watts 79       Minutes 15       METs 3.6         Track   Laps 19       Minutes 15       METs 3.43         Home Exercise Plan   Plans to continue exercise at Home (comment)       Frequency Add 2 additional days to program exercise sessions.       Initial Home Exercises Provided 09/08/22                Exercise Comments:   Exercise Comments     Row Name 09/08/22 1651 09/29/22 0851         Exercise Comments Reviewed MET, goals and home ExRx. Pt tolerated exercise well with an average MET level of 3.52. Pt feels good about his goals and is improving his diet and increasing his strength. He will continue to work on his balance and flexibility. Information given today on how to exercise safely. He will add in 1-2 days of exercise for 30-45 mins per session by walking, using his stationary bike and doing stretches. When he is finished his time in cardiac rehab Patient has been absent since 09/15/22 for syncopal episode at home. Being evaluated by cardiology for return date to cardiac rehab.               Exercise Goals and Review:   Exercise Goals Re-Evaluation :  Exercise Goals Re-Evaluation     Row Name 09/08/22 1643             Exercise Goal Re-Evaluation   Exercise Goals Review Increase Physical Activity;Understanding of Exercise Prescription;Increase Strength and Stamina;Knowledge and understanding of Target Heart Rate Range (THRR);Able to understand and use rate of perceived exertion (RPE) scale       Comments Reviewed MET, goals and home ExRx. Pt tolerated exercise well with an average MET level of 3.52. Pt feels good about his goals and is improving his diet and increasing his strength. He will continue to work on his balance and flexibility. Information given today on how to exercise safely. He will add in 1-2 days of exercise for 30-45 mins  per session by walking, using his stationary bike and doing stretches. When he is finished his time in cardiac rehab       Expected Outcomes Pt will Exercise on his own and gain strength. Will continue to monitor pt and progress workloads as tolerated without sign or symptom.                Discharge Exercise Prescription (Final Exercise Prescription Changes):  Exercise Prescription Changes - 09/08/22 1640       Response to Exercise   Blood Pressure (Admit) 128/62    Blood Pressure (Exercise) 122/66    Blood Pressure (Exit) 102/50    Heart Rate (Admit) 77 bpm    Heart Rate (Exercise) 114 bpm    Heart Rate (Exit) 86 bpm    Rating of Perceived Exertion (Exercise) 9.5    Perceived Dyspnea (Exercise) 0    Symptoms 0    Comments Reviewed MET's, goals and home ExRx    Duration Progress to 30 minutes of  aerobic without signs/symptoms of physical distress    Intensity THRR unchanged      Progression   Progression Continue to progress workloads to  maintain intensity without signs/symptoms of physical distress.    Average METs 3.52      Resistance Training   Training Prescription Yes    Weight 3 lbs    Reps 10-15    Time 10 Minutes      Recumbant Elliptical   Level 2    Watts 79    Minutes 15    METs 3.6      Track   Laps 19    Minutes 15    METs 3.43      Home Exercise Plan   Plans to continue exercise at Home (comment)    Frequency Add 2 additional days to program exercise sessions.    Initial Home Exercises Provided 09/08/22             Nutrition:  Target Goals: Understanding of nutrition guidelines, daily intake of sodium 1500mg , cholesterol 200mg , calories 30% from fat and 7% or less from saturated fats, daily to have 5 or more servings of fruits and vegetables.  Biometrics:    Nutrition Therapy Plan and Nutrition Goals:  Nutrition Therapy & Goals - 10/30/22 1618       Nutrition Therapy   Diet Heart Healthy Diet    Drug/Food Interactions  Statins/Certain Fruits      Personal Nutrition Goals   Nutrition Goal Patient to identify strategies for reducing cardiovascular risk by attending the [employer] education and nutrition series weekly.    Personal Goal #2 Patient to improve diet quality by using the plate method as a guide for meal planning to include lean protein/plant protein, fruits, vegetables, whole grains, nonfat dairy as part of a well-balanced diet.    Comments Ed has not attended ICR since 8/9 due to ED visit for dehydration and ongoing follow-up appointments. Priort to this, Ed had attended the [employer] education series regularly. He has good understanding of recommendations for fiber, saturated fat, and sodium. His A1c is in a pre-diabetic range. He was also started on oral iron related to low hemoglobin which has improved. Will continue to follow-up on goals/lifestyle changes upon patient's return. Patient will benefit from participation in intensive cardiac rehab for nutrition, exercise, and lifestyle modification.      Intervention Plan   Intervention Prescribe, educate and counsel regarding individualized specific dietary modifications aiming towards targeted core components such as weight, hypertension, lipid management, diabetes, heart failure and other comorbidities.;Nutrition handout(s) given to patient.    Expected Outcomes Long Term Goal: Adherence to prescribed nutrition plan.;Short Term Goal: Understand basic principles of dietary content, such as calories, fat, sodium, cholesterol and nutrients.             Nutrition Assessments:  MEDIFICTS Score Key: >=70 Need to make dietary changes  40-70 Heart Healthy Diet <= 40 Therapeutic Level Cholesterol Diet   Flowsheet Row INTENSIVE CARDIAC REHAB from 08/28/2022 in [facility] Heart, Vascular, & Lung Health  Picture Your Plate Total Score on Admission 58      Picture Your Plate Scores: <53 Unhealthy dietary pattern with much room  for improvement. 41-50 Dietary pattern unlikely to meet recommendations for good health and room for improvement. 51-60 More healthful dietary pattern, with some room for improvement.  >60 Healthy dietary pattern, although there may be some specific behaviors that could be improved.    Nutrition Goals Re-Evaluation:  Nutrition Goals Re-Evaluation     Row Name 09/29/22 1428 10/30/22 1618           Goals   Current  Weight 175 lb 7.8 oz (79.6 kg)  weight per last attended session on 8/9 --      Comment A1c 6.1, Cr 1.71, GFR 40, hemoglobin 10.7; other most recent labs Cholesterole 201, LDL 131 A1c 6.1, Cr 1.71, GFR 40; other most recent labs cholesterol 201, LDL 131      Expected Outcome Ed has not attended ICR since 8/9 due to ED visit for dehydration and ongoing follow-up appointments. Priort to this, Ed had attended the [employer] education series regularly. He has good understanding of recommendations for fiber, saturated fat, and sodium. His A1c is in a pre-diabetic range. He was also started on oral iron related to low hemoglobin. He continues regular follow-up appointments with nephrology related to left nephrectomy.  Patient will benefit from participation in intensive cardiac rehab for nutrition, exercise, and lifestyle modification. Ed has not attended ICR since 8/9 due to ED visit for dehydration and ongoing follow-up appointments. Priort to this, Ed had attended the [employer] education series regularly. He has good understanding of recommendations for fiber, saturated fat, and sodium. His A1c is in a pre-diabetic range. He was also started on oral iron related to low hemoglobin which has improved. Will continue to follow-up on goals/lifestyle changes upon patient's return. Patient will benefit from participation in intensive cardiac rehab for nutrition, exercise, and lifestyle modification.               Nutrition Goals Re-Evaluation:  Nutrition Goals Re-Evaluation     Row Name  09/29/22 1428 10/30/22 1618           Goals   Current Weight 175 lb 7.8 oz (79.6 kg)  weight per last attended session on 8/9 --      Comment A1c 6.1, Cr 1.71, GFR 40, hemoglobin 10.7; other most recent labs Cholesterole 201, LDL 131 A1c 6.1, Cr 1.71, GFR 40; other most recent labs cholesterol 201, LDL 131      Expected Outcome Ed has not attended ICR since 8/9 due to ED visit for dehydration and ongoing follow-up appointments. Priort to this, Ed had attended the [employer] education series regularly. He has good understanding of recommendations for fiber, saturated fat, and sodium. His A1c is in a pre-diabetic range. He was also started on oral iron related to low hemoglobin. He continues regular follow-up appointments with nephrology related to left nephrectomy.  Patient will benefit from participation in intensive cardiac rehab for nutrition, exercise, and lifestyle modification. Ed has not attended ICR since 8/9 due to ED visit for dehydration and ongoing follow-up appointments. Priort to this, Ed had attended the [employer] education series regularly. He has good understanding of recommendations for fiber, saturated fat, and sodium. His A1c is in a pre-diabetic range. He was also started on oral iron related to low hemoglobin which has improved. Will continue to follow-up on goals/lifestyle changes upon patient's return. Patient will benefit from participation in intensive cardiac rehab for nutrition, exercise, and lifestyle modification.               Nutrition Goals Discharge (Final Nutrition Goals Re-Evaluation):  Nutrition Goals Re-Evaluation - 10/30/22 1618       Goals   Comment A1c 6.1, Cr 1.71, GFR 40; other most recent labs cholesterol 201, LDL 131    Expected Outcome Ed has not attended ICR since 8/9 due to ED visit for dehydration and ongoing follow-up appointments. Priort to this, Ed had attended the [employer] education series regularly. He has good understanding of recommendations  for fiber,  saturated fat, and sodium. His A1c is in a pre-diabetic range. He was also started on oral iron related to low hemoglobin which has improved. Will continue to follow-up on goals/lifestyle changes upon patient's return. Patient will benefit from participation in intensive cardiac rehab for nutrition, exercise, and lifestyle modification.             Psychosocial: Target Goals: Acknowledge presence or absence of significant depression and/or stress, maximize coping skills, provide positive support system. Participant is able to verbalize types and ability to use techniques and skills needed for reducing stress and depression.  Initial Review & Psychosocial Screening:   Quality of Life Scores:  Scores of 19 and below usually indicate a poorer quality of life in these areas.  A difference of  2-3 points is a clinically meaningful difference.  A difference of 2-3 points in the total score of the Quality of Life Index has been associated with significant improvement in overall quality of life, self-image, physical symptoms, and general health in studies assessing change in quality of life.  PHQ-9: Review Flowsheet  More data exists      08/22/2022 08/04/2022 05/16/2022 11/09/2021 04/13/2021  Depression screen PHQ 2/9  Decreased Interest 0 0 0 0 0  Down, Depressed, Hopeless 0 0 0 0 0  PHQ - 2 Score 0 0 0 0 0  Altered sleeping 1 - - - -  Tired, decreased energy 1 - - - -  Change in appetite 0 - - - -  Feeling bad or failure about yourself  0 - - - -  Trouble concentrating 0 - - - -  Moving slowly or fidgety/restless 0 - - - -  Suicidal thoughts 0 - - - -  PHQ-9 Score 2 - - - -  Difficult doing work/chores Somewhat difficult - - - -    Details           Interpretation of Total Score  Total Score Depression Severity:  1-4 = Minimal depression, 5-9 = Mild depression, 10-14 = Moderate depression, 15-19 = Moderately severe depression, 20-27 = Severe depression   Psychosocial  Evaluation and Intervention:   Psychosocial Re-Evaluation:  Psychosocial Re-Evaluation     Row Name 09/06/22 1401 10/03/22 1511 11/01/22 0920         Psychosocial Re-Evaluation   Current issues with Current Stress Concerns Current Stress Concerns Current Stress Concerns     Comments Ed started cardiac rehab on 08/28/22. Ed has not not voiced any increase concerns during exercise so far. Exercise is currently on hold. Exercise is still on hold.     Expected Outcomes Ed will have decreased or controlled stress upon completion of intensive cardiac rehab. Ed will have decreased or controlled stress upon completion of intensive cardiac rehab. Ed will have decreased or controlled stress upon completion of intensive cardiac rehab.     Interventions Encouraged to attend Cardiac Rehabilitation for the exercise Encouraged to attend Cardiac Rehabilitation for the exercise Encouraged to attend Cardiac Rehabilitation for the exercise     Continue Psychosocial Services  Follow up required by staff Follow up required by staff Follow up required by staff       Initial Review   Source of Stress Concerns Chronic Illness Chronic Illness Chronic Illness     Comments Will continue to montior and offer support as needed Will continue to montior and offer support as needed Will continue to montior and offer support as needed  Psychosocial Discharge (Final Psychosocial Re-Evaluation):  Psychosocial Re-Evaluation - 11/01/22 0920       Psychosocial Re-Evaluation   Current issues with Current Stress Concerns    Comments Exercise is still on hold.    Expected Outcomes Ed will have decreased or controlled stress upon completion of intensive cardiac rehab.    Interventions Encouraged to attend Cardiac Rehabilitation for the exercise    Continue Psychosocial Services  Follow up required by staff      Initial Review   Source of Stress Concerns Chronic Illness    Comments Will continue to montior  and offer support as needed             Vocational Rehabilitation: Provide vocational rehab assistance to qualifying candidates.   Vocational Rehab Evaluation & Intervention:   Education: Education Goals: Education classes will be provided on a weekly basis, covering required topics. Participant will state understanding/return demonstration of topics presented.    Education     Row Name 09/04/22 1600     Education   Cardiac Education Topics [employer]   Geographical information systems officer Psychosocial   Psychosocial Workshop Healthy Sleep for a Healthy Heart   Instruction Review Code 1- Verbalizes Understanding   Class Start Time 1400   Class Stop Time 1455   Class Time Calculation (min) 55 min    Row Name 09/06/22 1600     Education   Cardiac Education Topics [employer]   Customer service manager   Weekly Topic International Cuisine- Spotlight on the [employer] Zones   Instruction Review Code 1- Verbalizes Understanding   Class Start Time 1400   Class Stop Time 1448   Class Time Calculation (min) 48 min    Row Name 09/08/22 1500     Education   Cardiac Education Topics [employer]   Psychologist, forensic Exercise Education   Exercise Education Improving Performance   Instruction Review Code 1- Verbalizes Understanding   Class Start Time 1400   Class Stop Time 1455   Class Time Calculation (min) 55 min    Row Name 09/11/22 1500     Education   Cardiac Education Topics [employer]   Glass blower/designer Nutrition   Nutrition Workshop Fueling a Forensic psychologist   Instruction Review Code 1- Tax inspector   Class Start Time 1400   Class Stop Time 1452   Class Time Calculation (min) 52 min    Row Name 09/13/22 1500     Education   Cardiac Education Topics [employer]   Loss adjuster, chartered   Weekly Topic Simple Sides and Sauces   Instruction Review Code 1- Verbalizes Understanding   Class Start Time 1400   Class Stop Time 1445   Class Time Calculation (min) 45 min    Row Name 09/15/22 1400     Education   Cardiac Education Topics [employer]   Nurse, children's Exercise Physiologist   Select Psychosocial   Psychosocial How Our Thoughts Can Heal Our Hearts   Instruction Review Code 1- Verbalizes Understanding   Class Start Time 1400   Class Stop Time 1450   Class Time Calculation (min) 50  min            Core Videos: Exercise    Move It!  Clinical staff conducted group or individual video education with verbal and written material and guidebook.  Patient learns the recommended [employer] exercise program. Exercise with the goal of living a long, healthy life. Some of the health benefits of exercise include controlled diabetes, healthier blood pressure levels, improved cholesterol levels, improved heart and lung capacity, improved sleep, and better body composition. Everyone should speak with their doctor before starting or changing an exercise routine.  Biomechanical Limitations Clinical staff conducted group or individual video education with verbal and written material and guidebook.  Patient learns how biomechanical limitations can impact exercise and how we can mitigate and possibly overcome limitations to have an impactful and balanced exercise routine.  Body Composition Clinical staff conducted group or individual video education with verbal and written material and guidebook.  Patient learns that body composition (ratio of muscle mass to fat mass) is a key component to assessing overall fitness, rather than body weight alone. Increased fat mass, especially visceral belly fat, can put [place] at increased risk for metabolic syndrome, type 2 diabetes, heart disease, and even death. It is  recommended to combine diet and exercise (cardiovascular and resistance training) to improve your body composition. Seek guidance from your physician and exercise physiologist before implementing an exercise routine.  Exercise Action Plan Clinical staff conducted group or individual video education with verbal and written material and guidebook.  Patient learns the recommended strategies to achieve and enjoy long-term exercise adherence, including variety, self-motivation, self-efficacy, and positive decision making. Benefits of exercise include fitness, good health, weight management, more energy, better sleep, less stress, and overall well-being.  Medical   Heart Disease Risk Reduction Clinical staff conducted group or individual video education with verbal and written material and guidebook.  Patient learns our heart is our most vital organ as it circulates oxygen, nutrients, white blood cells, and hormones throughout the entire body, and carries waste away. Data supports a plant-based eating plan like the [employer]rit[employer]in Program for its effectiveness in slowing progression of and reversing heart disease. The video provides a number of recommendations to address heart disease.   Metabolic Syndrome and Belly Fat  Clinical staff conducted group or individual video education with verbal and written material and guidebook.  Patient learns what metabolic syndrome is, how it leads to heart disease, and how one can reverse it and keep it from coming back. You have metabolic syndrome if you have 3 of the following 5 criteria: abdominal obesity, high blood pressure, high triglycerides, low HDL cholesterol, and high blood sugar.  Hypertension and Heart Disease Clinical staff conducted group or individual video education with verbal and written material and guidebook.  Patient learns that high blood pressure, or hypertension, is very common in the [place]. Hypertension is largely due to excessive salt  intake, but other important risk factors include being overweight, physical inactivity, drinking too much alcohol, smoking, and not eating enough potassium from fruits and vegetables. High blood pressure is a leading risk factor for heart attack, stroke, congestive heart failure, dementia, kidney failure, and premature death. Long-term effects of excessive salt intake include stiffening of the arteries and thickening of heart muscle and organ damage. Recommendations include ways to reduce hypertension and the risk of heart disease.  Diseases of Our Time - Focusing on Diabetes Clinical staff conducted group or individual video education with verbal and written material and guidebook.  Patient learns why the best way to stop diseases of our time is prevention, through food and other lifestyle changes. Medicine (such as prescription pills and surgeries) is often only a Band-Aid on the problem, not a long-term solution. Most common diseases of our time include obesity, type 2 diabetes, hypertension, heart disease, and cancer. The Pritikin Program is recommended and has been proven to help reduce, reverse, and/or prevent the damaging effects of metabolic syndrome.  Nutrition   Overview of the Pritikin Eating Plan  Clinical staff conducted group or individual video education with verbal and written material and guidebook.  Patient learns about the Pritikin Eating Plan for disease risk reduction. The Pritikin Eating Plan emphasizes a wide variety of unrefined, minimally-processed carbohydrates, like fruits, vegetables, whole grains, and legumes. Go, Caution, and Stop food choices are explained. Plant-based and lean animal proteins are emphasized. Rationale provided for low sodium intake for blood pressure control, low added sugars for blood sugar stabilization, and low added fats and oils for coronary artery disease risk reduction and weight management.  Calorie Density  Clinical staff conducted group or  individual video education with verbal and written material and guidebook.  Patient learns about calorie density and how it impacts the Pritikin Eating Plan. Knowing the characteristics of the food you choose will help you decide whether those foods will lead to weight gain or weight loss, and whether you want to consume more or less of them. Weight loss is usually a side effect of the Pritikin Eating Plan because of its focus on low calorie-dense foods.  Label Reading  Clinical staff conducted group or individual video education with verbal and written material and guidebook.  Patient learns about the Pritikin recommended label reading guidelines and corresponding recommendations regarding calorie density, added sugars, sodium content, and whole grains.  Dining Out - Part 1  Clinical staff conducted group or individual video education with verbal and written material and guidebook.  Patient learns that restaurant meals can be sabotaging because they can be so high in calories, fat, sodium, and/or sugar. Patient learns recommended strategies on how to positively address this and avoid unhealthy pitfalls.  Facts on Fats  Clinical staff conducted group or individual video education with verbal and written material and guidebook.  Patient learns that lifestyle modifications can be just as effective, if not more so, as many medications for lowering your risk of heart disease. A Pritikin lifestyle can help to reduce your risk of inflammation and atherosclerosis (cholesterol build-up, or plaque, in the artery walls). Lifestyle interventions such as dietary choices and physical activity address the cause of atherosclerosis. A review of the types of fats and their impact on blood cholesterol levels, along with dietary recommendations to reduce fat intake is also included.  Nutrition Action Plan  Clinical staff conducted group or individual video education with verbal and written material and guidebook.   Patient learns how to incorporate Pritikin recommendations into their lifestyle. Recommendations include planning and keeping personal health goals in mind as an important part of their success.  Healthy Mind-Set    Healthy Minds, Bodies, Hearts  Clinical staff conducted group or individual video education with verbal and written material and guidebook.  Patient learns how to identify when they are stressed. Video will discuss the impact of that stress, as well as the many benefits of stress management. Patient will also be introduced to stress management techniques. The way we think, act, and feel has an impact on our hearts.  How Our Thoughts  Can Heal Our Hearts  Clinical staff conducted group or individual video education with verbal and written material and guidebook.  Patient learns that negative thoughts can cause depression and anxiety. This can result in negative lifestyle behavior and serious health problems. Cognitive behavioral therapy is an effective method to help control our thoughts in order to change and improve our emotional outlook.  Additional Videos:  Exercise    Improving Performance  Clinical staff conducted group or individual video education with verbal and written material and guidebook.  Patient learns to use a non-linear approach by alternating intensity levels and lengths of time spent exercising to help burn more calories and lose more body fat. Cardiovascular exercise helps improve heart health, metabolism, hormonal balance, blood sugar control, and recovery from fatigue. Resistance training improves strength, endurance, balance, coordination, reaction time, metabolism, and muscle mass. Flexibility exercise improves circulation, posture, and balance. Seek guidance from your physician and exercise physiologist before implementing an exercise routine and learn your capabilities and proper form for all exercise.  Introduction to Yoga  Clinical staff conducted group or  individual video education with verbal and written material and guidebook.  Patient learns about yoga, a discipline of the coming together of mind, breath, and body. The benefits of yoga include improved flexibility, improved range of motion, better posture and core strength, increased lung function, weight loss, and positive self-image. Yoga's heart health benefits include lowered blood pressure, healthier heart rate, decreased cholesterol and triglyceride levels, improved immune function, and reduced stress. Seek guidance from your physician and exercise physiologist before implementing an exercise routine and learn your capabilities and proper form for all exercise.  Medical   Aging: Enhancing Your Quality of Life  Clinical staff conducted group or individual video education with verbal and written material and guidebook.  Patient learns key strategies and recommendations to stay in good physical health and enhance quality of life, such as prevention strategies, having an advocate, securing a Health Care Proxy and Power of Attorney, and keeping a list of medications and system for tracking them. It also discusses how to avoid risk for bone loss.  Biology of Weight Control  Clinical staff conducted group or individual video education with verbal and written material and guidebook.  Patient learns that weight gain occurs because we consume more calories than we burn (eating more, moving less). Even if your body weight is normal, you may have higher ratios of fat compared to muscle mass. Too much body fat puts you at increased risk for cardiovascular disease, heart attack, stroke, type 2 diabetes, and obesity-related cancers. In addition to exercise, following the Pritikin Eating Plan can help reduce your risk.  Decoding Lab Results  Clinical staff conducted group or individual video education with verbal and written material and guidebook.  Patient learns that lab test reflects one measurement whose  values change over time and are influenced by many factors, including medication, stress, sleep, exercise, food, hydration, pre-existing medical conditions, and more. It is recommended to use the knowledge from this video to become more involved with your lab results and evaluate your numbers to speak with your doctor.   Diseases of Our Time - Overview  Clinical staff conducted group or individual video education with verbal and written material and guidebook.  Patient learns that according to the CDC, 50% to 70% of chronic diseases (such as obesity, type 2 diabetes, elevated lipids, hypertension, and heart disease) are avoidable through lifestyle improvements including healthier food choices, listening to satiety cues, and  increased physical activity.  Sleep Disorders Clinical staff conducted group or individual video education with verbal and written material and guidebook.  Patient learns how good quality and duration of sleep are important to overall health and well-being. Patient also learns about sleep disorders and how they impact health along with recommendations to address them, including discussing with a physician.  Nutrition  Dining Out - Part 2 Clinical staff conducted group or individual video education with verbal and written material and guidebook.  Patient learns how to plan ahead and communicate in order to maximize their dining experience in a healthy and nutritious manner. Included are recommended food choices based on the type of restaurant the patient is visiting.   Fueling a Banker conducted group or individual video education with verbal and written material and guidebook.  There is a strong connection between our food choices and our health. Diseases like obesity and type 2 diabetes are very prevalent and are in large-part due to lifestyle choices. The Pritikin Eating Plan provides plenty of food and hunger-curbing satisfaction. It is easy to follow,  affordable, and helps reduce health risks.  Menu Workshop  Clinical staff conducted group or individual video education with verbal and written material and guidebook.  Patient learns that restaurant meals can sabotage health goals because they are often packed with calories, fat, sodium, and sugar. Recommendations include strategies to plan ahead and to communicate with the manager, chef, or server to help order a healthier meal.  Planning Your Eating Strategy  Clinical staff conducted group or individual video education with verbal and written material and guidebook.  Patient learns about the Pritikin Eating Plan and its benefit of reducing the risk of disease. The Pritikin Eating Plan does not focus on calories. Instead, it emphasizes high-quality, nutrient-rich foods. By knowing the characteristics of the foods, we choose, we can determine their calorie density and make informed decisions.  Targeting Your Nutrition Priorities  Clinical staff conducted group or individual video education with verbal and written material and guidebook.  Patient learns that lifestyle habits have a tremendous impact on disease risk and progression. This video provides eating and physical activity recommendations based on your personal health goals, such as reducing LDL cholesterol, losing weight, preventing or controlling type 2 diabetes, and reducing high blood pressure.  Vitamins and Minerals  Clinical staff conducted group or individual video education with verbal and written material and guidebook.  Patient learns different ways to obtain key vitamins and minerals, including through a recommended healthy diet. It is important to discuss all supplements you take with your doctor.   Healthy Mind-Set    Smoking Cessation  Clinical staff conducted group or individual video education with verbal and written material and guidebook.  Patient learns that cigarette smoking and tobacco addiction pose a serious health  risk which affects millions of people. Stopping smoking will significantly reduce the risk of heart disease, lung disease, and many forms of cancer. Recommended strategies for quitting are covered, including working with your doctor to develop a successful plan.  Culinary   Becoming a Set designer conducted group or individual video education with verbal and written material and guidebook.  Patient learns that cooking at home can be healthy, cost-effective, quick, and puts them in control. Keys to cooking healthy recipes will include looking at your recipe, assessing your equipment needs, planning ahead, making it simple, choosing cost-effective seasonal ingredients, and limiting the use of added fats, salts, and sugars.  Cooking - Breakfast and Snacks  Clinical staff conducted group or individual video education with verbal and written material and guidebook.  Patient learns how important breakfast is to satiety and nutrition through the entire day. Recommendations include key foods to eat during breakfast to help stabilize blood sugar levels and to prevent overeating at meals later in the day. Planning ahead is also a key component.  Cooking - Educational psychologist conducted group or individual video education with verbal and written material and guidebook.  Patient learns eating strategies to improve overall health, including an approach to cook more at home. Recommendations include thinking of animal protein as a side on your plate rather than center stage and focusing instead on lower calorie dense options like vegetables, fruits, whole grains, and plant-based proteins, such as beans. Making sauces in large quantities to freeze for later and leaving the skin on your vegetables are also recommended to maximize your experience.  Cooking - Healthy Salads and Dressing Clinical staff conducted group or individual video education with verbal and written material and  guidebook.  Patient learns that vegetables, fruits, whole grains, and legumes are the foundations of the Pritikin Eating Plan. Recommendations include how to incorporate each of these in flavorful and healthy salads, and how to create homemade salad dressings. Proper handling of ingredients is also covered. Cooking - Soups and [employer] - Soups and Desserts Clinical staff conducted group or individual video education with verbal and written material and guidebook.  Patient learns that Pritikin soups and desserts make for easy, nutritious, and delicious snacks and meal components that are low in sodium, fat, sugar, and calorie density, while high in vitamins, minerals, and filling fiber. Recommendations include simple and healthy ideas for soups and desserts.   Overview     The Pritikin Solution Program Overview Clinical staff conducted group or individual video education with verbal and written material and guidebook.  Patient learns that the results of the Pritikin Program have been documented in more than 100 articles published in peer-reviewed journals, and the benefits include reducing risk factors for (and, in some cases, even reversing) high cholesterol, high blood pressure, type 2 diabetes, obesity, and more! An overview of the three key pillars of the Pritikin Program will be covered: eating well, doing regular exercise, and having a healthy mind-set.  WORKSHOPS  Exercise: Exercise Basics: Building Your Action Plan Clinical staff led group instruction and group discussion with PowerPoint presentation and patient guidebook. To enhance the learning environment the use of posters, models and videos may be added. At the conclusion of this workshop, patients will comprehend the difference between physical activity and exercise, as well as the benefits of incorporating both, into their routine. Patients will understand the FITT (Frequency, Intensity, Time, and Type) principle and how to  use it to build an exercise action plan. In addition, safety concerns and other considerations for exercise and cardiac rehab will be addressed by the presenter. The purpose of this lesson is to promote a comprehensive and effective weekly exercise routine in order to improve patients' overall level of fitness.   Managing Heart Disease: Your Path to a Healthier Heart Clinical staff led group instruction and group discussion with PowerPoint presentation and patient guidebook. To enhance the learning environment the use of posters, models and videos may be added.At the conclusion of this workshop, patients will understand the anatomy and physiology of the heart. Additionally, they will understand how Pritikin's three pillars impact the risk factors,  the progression, and the management of heart disease.  The purpose of this lesson is to provide a high-level overview of the heart, heart disease, and how the Pritikin lifestyle positively impacts risk factors.  Exercise Biomechanics Clinical staff led group instruction and group discussion with PowerPoint presentation and patient guidebook. To enhance the learning environment the use of posters, models and videos may be added. Patients will learn how the structural parts of their bodies function and how these functions impact their daily activities, movement, and exercise. Patients will learn how to promote a neutral spine, learn how to manage pain, and identify ways to improve their physical movement in order to promote healthy living. The purpose of this lesson is to expose patients to common physical limitations that impact physical activity. Participants will learn practical ways to adapt and manage aches and pains, and to minimize their effect on regular exercise. Patients will learn how to maintain good posture while sitting, walking, and lifting.  Balance Training and Fall Prevention  Clinical staff led group instruction and group discussion  with PowerPoint presentation and patient guidebook. To enhance the learning environment the use of posters, models and videos may be added. At the conclusion of this workshop, patients will understand the importance of their sensorimotor skills (vision, proprioception, and the vestibular system) in maintaining their ability to balance as they age. Patients will apply a variety of balancing exercises that are appropriate for their current level of function. Patients will understand the common causes for poor balance, possible solutions to these problems, and ways to modify their physical environment in order to minimize their fall risk. The purpose of this lesson is to teach patients about the importance of maintaining balance as they age and ways to minimize their risk of falling.  WORKSHOPS   Nutrition:  Fueling a Ship broker led group instruction and group discussion with PowerPoint presentation and patient guidebook. To enhance the learning environment the use of posters, models and videos may be added. Patients will review the foundational principles of the Pritikin Eating Plan and understand what constitutes a serving size in each of the food groups. Patients will also learn Pritikin-friendly foods that are better choices when away from home and review make-ahead meal and snack options. Calorie density will be reviewed and applied to three nutrition priorities: weight maintenance, weight loss, and weight gain. The purpose of this lesson is to reinforce (in a group setting) the key concepts around what patients are recommended to eat and how to apply these guidelines when away from home by planning and selecting Pritikin-friendly options. Patients will understand how calorie density may be adjusted for different weight management goals.  Mindful Eating  Clinical staff led group instruction and group discussion with PowerPoint presentation and patient guidebook. To enhance the  learning environment the use of posters, models and videos may be added. Patients will briefly review the concepts of the Pritikin Eating Plan and the importance of low-calorie dense foods. The concept of mindful eating will be introduced as well as the importance of paying attention to internal hunger signals. Triggers for non-hunger eating and techniques for dealing with triggers will be explored. The purpose of this lesson is to provide patients with the opportunity to review the basic principles of the Pritikin Eating Plan, discuss the value of eating mindfully and how to measure internal cues of hunger and fullness using the Hunger Scale. Patients will also discuss reasons for non-hunger eating and learn strategies to use for controlling  emotional eating.  Targeting Your Nutrition Priorities Clinical staff led group instruction and group discussion with PowerPoint presentation and patient guidebook. To enhance the learning environment the use of posters, models and videos may be added. Patients will learn how to determine their genetic susceptibility to disease by reviewing their family history. Patients will gain insight into the importance of diet as part of an overall healthy lifestyle in mitigating the impact of genetics and other environmental insults. The purpose of this lesson is to provide patients with the opportunity to assess their personal nutrition priorities by looking at their family history, their own health history and current risk factors. Patients will also be able to discuss ways of prioritizing and modifying the Pritikin Eating Plan for their highest risk areas  Menu  Clinical staff led group instruction and group discussion with PowerPoint presentation and patient guidebook. To enhance the learning environment the use of posters, models and videos may be added. Using menus brought in from [employer], or printed from [employer], patients will apply the Pritikin dining out  guidelines that were presented in the [employer] video. Patients will also be able to practice these guidelines in a variety of provided scenarios. The purpose of this lesson is to provide patients with the opportunity to practice hands-on learning of the Pritikin Dining Out guidelines with actual menus and practice scenarios.  Label Reading Clinical staff led group instruction and group discussion with PowerPoint presentation and patient guidebook. To enhance the learning environment the use of posters, models and videos may be added. Patients will review and discuss the Pritikin label reading guidelines presented in Pritikin's Label Reading Educational series video. Using fool labels brought in from local grocery stores and markets, patients will apply the label reading guidelines and determine if the packaged food meet the Pritikin guidelines. The purpose of this lesson is to provide patients with the opportunity to review, discuss, and practice hands-on learning of the Pritikin Label Reading guidelines with actual packaged food labels. Cooking School  Pritikin's [employer] are designed to teach patients ways to prepare quick, simple, and affordable recipes at home. The importance of nutrition's role in chronic disease risk reduction is reflected in its emphasis in the overall Pritikin program. By learning how to prepare essential core Pritikin Eating Plan recipes, patients will increase control over what they eat; be able to customize the flavor of foods without the use of added salt, sugar, or fat; and improve the quality of the food they consume. By learning a set of core recipes which are easily assembled, quickly prepared, and affordable, patients are more likely to prepare more healthy foods at home. These workshops focus on convenient breakfasts, simple entres, side dishes, and desserts which can be prepared with minimal effort and are consistent with nutrition  recommendations for cardiovascular risk reduction. Cooking [employer] are taught by a Armed forces logistics/support/administrative officer (RD) who has been trained by the [employer]. The chef or RD has a clear understanding of the importance of minimizing - if not completely eliminating - added fat, sugar, and sodium in recipes. Throughout the series of Cooking School Workshop sessions, patients will learn about healthy ingredients and efficient methods of cooking to build confidence in their capability to prepare    Cooking School weekly topics:  Adding Flavor- Sodium-Free  Fast and Healthy Breakfasts  Powerhouse Plant-Based Proteins  Satisfying Salads and Dressings  Simple Sides and Sauces  International Cuisine-Spotlight on the Blue Zones  Delicious Desserts  Diplomatic Services operational officer - Meals in a Hydrologist and Snacks  Comforting Weekend Breakfasts  One-Pot Wonders   Fast [employer] Your Pritikin Plate  WORKSHOPS   Healthy Mindset (Psychosocial):  Focused Goals, Sustainable Changes Clinical staff led group instruction and group discussion with PowerPoint presentation and patient guidebook. To enhance the learning environment the use of posters, models and videos may be added. Patients will be able to apply effective goal setting strategies to establish at least one personal goal, and then take consistent, meaningful action toward that goal. They will learn to identify common barriers to achieving personal goals and develop strategies to overcome them. Patients will also gain an understanding of how our mind-set can impact our ability to achieve goals and the importance of cultivating a positive and growth-oriented mind-set. The purpose of this lesson is to provide patients with a deeper understanding of how to set and achieve personal goals, as well as the tools and strategies needed to overcome common obstacles which may arise along  the way.  From Head to Heart: The Power of a Healthy Outlook  Clinical staff led group instruction and group discussion with PowerPoint presentation and patient guidebook. To enhance the learning environment the use of posters, models and videos may be added. Patients will be able to recognize and describe the impact of emotions and mood on physical health. They will discover the importance of self-care and explore self-care practices which may work for them. Patients will also learn how to utilize the 4 C's to cultivate a healthier outlook and better manage stress and challenges. The purpose of this lesson is to demonstrate to patients how a healthy outlook is an essential part of maintaining good health, especially as they continue their cardiac rehab journey.  Healthy Sleep for a Healthy Heart Clinical staff led group instruction and group discussion with PowerPoint presentation and patient guidebook. To enhance the learning environment the use of posters, models and videos may be added. At the conclusion of this workshop, patients will be able to demonstrate knowledge of the importance of sleep to overall health, well-being, and quality of life. They will understand the symptoms of, and treatments for, common sleep disorders. Patients will also be able to identify daytime and nighttime behaviors which impact sleep, and they will be able to apply these tools to help manage sleep-related challenges. The purpose of this lesson is to provide patients with a general overview of sleep and outline the importance of quality sleep. Patients will learn about a few of the most common sleep disorders. Patients will also be introduced to the concept of "sleep hygiene," and discover ways to self-manage certain sleeping problems through simple daily behavior changes. Finally, the workshop will motivate patients by clarifying the links between quality sleep and their goals of heart-healthy living.   Recognizing and  Reducing Stress Clinical staff led group instruction and group discussion with PowerPoint presentation and patient guidebook. To enhance the learning environment the use of posters, models and videos may be added. At the conclusion of this workshop, patients will be able to understand the types of stress reactions, differentiate between acute and chronic stress, and recognize the impact that chronic stress has on their health. They will also be able to apply different coping mechanisms, such as reframing negative self-talk. Patients will have the opportunity to practice a variety of stress management techniques, such as deep abdominal breathing, progressive muscle relaxation, and/or  guided imagery.  The purpose of this lesson is to educate patients on the role of stress in their lives and to provide healthy techniques for coping with it.  Learning Barriers/Preferences:   Education Topics:  Knowledge Questionnaire Score:   Core Components/Risk Factors/Patient Goals at Admission:   Core Components/Risk Factors/Patient Goals Review:   Goals and Risk Factor Review     Row Name 09/06/22 1409 10/03/22 1516 11/01/22 0920         Core Components/Risk Factors/Patient Goals Review   Personal Goals Review Weight Management/Obesity;Hypertension;Lipids;Stress Weight Management/Obesity;Hypertension;Lipids;Stress Weight Management/Obesity;Hypertension;Lipids;Stress     Review Ed started cardiac rehab on 08/28/22. Ed has been doing well with exercise. Ed did report feeling lightheaded post exercise on 09/04/22. Encourged to increase fluid intake and to eat before coming to class. [name] last day of  cardiac rehab was on 09/15/22. Exercise is currently on hold [name] last day of  cardiac rehab was on 09/15/22. Exercise remains on hold. I spoke with Ed he still wants to return to exercise     Expected Outcomes Ed will continue to participate in intensive cardiac rehab for exercise, nutrition and lifestyle  modifications Ed will continue to participate in intensive cardiac rehab for exercise, nutrition and lifestyle modifications Ed will continue to participate in intensive cardiac rehab for exercise, nutrition and lifestyle modifications              Core Components/Risk Factors/Patient Goals at Discharge (Final Review):   Goals and Risk Factor Review - 11/01/22 0920       Core Components/Risk Factors/Patient Goals Review   Personal Goals Review Weight Management/Obesity;Hypertension;Lipids;Stress    Review [name] last day of  cardiac rehab was on 09/15/22. Exercise remains on hold. I spoke with Ed he still wants to return to exercise    Expected Outcomes Ed will continue to participate in intensive cardiac rehab for exercise, nutrition and lifestyle modifications             ITP Comments:  ITP Comments     Row Name 09/06/22 1357 10/03/22 1510 11/01/22 0919       ITP Comments 30 day ITP Review. Ed started cardiac rehab on 08/28/22 and is off to a good start to exercise. Ed was lightheaded post exercise on 09/04/22. Encouraged to drink more water 30 day ITP Review. Exercise is currently on hold per Dr Tenny Craw. 30 day ITP Review. Exercise is currently on hold per Dr Tenny Craw.              Comments: See ITP comments.[name] RN BSN

## 2022-11-02 ENCOUNTER — Encounter: Payer: Self-pay | Admitting: Internal Medicine

## 2022-11-03 ENCOUNTER — Encounter (HOSPITAL_COMMUNITY): Payer: Medicare Other

## 2022-11-03 DIAGNOSIS — N1832 Chronic kidney disease, stage 3b: Secondary | ICD-10-CM | POA: Diagnosis not present

## 2022-11-06 ENCOUNTER — Encounter (HOSPITAL_COMMUNITY): Payer: Medicare Other

## 2022-11-08 ENCOUNTER — Encounter (HOSPITAL_COMMUNITY): Payer: Medicare Other

## 2022-11-09 ENCOUNTER — Other Ambulatory Visit: Payer: Self-pay | Admitting: Internal Medicine

## 2022-11-09 DIAGNOSIS — C642 Malignant neoplasm of left kidney, except renal pelvis: Secondary | ICD-10-CM | POA: Diagnosis not present

## 2022-11-09 DIAGNOSIS — I129 Hypertensive chronic kidney disease with stage 1 through stage 4 chronic kidney disease, or unspecified chronic kidney disease: Secondary | ICD-10-CM | POA: Diagnosis not present

## 2022-11-09 DIAGNOSIS — D631 Anemia in chronic kidney disease: Secondary | ICD-10-CM | POA: Diagnosis not present

## 2022-11-09 DIAGNOSIS — N1832 Chronic kidney disease, stage 3b: Secondary | ICD-10-CM | POA: Diagnosis not present

## 2022-11-09 DIAGNOSIS — I2510 Atherosclerotic heart disease of native coronary artery without angina pectoris: Secondary | ICD-10-CM | POA: Diagnosis not present

## 2022-11-10 ENCOUNTER — Encounter (HOSPITAL_COMMUNITY): Payer: Medicare Other

## 2022-11-13 ENCOUNTER — Ambulatory Visit: Payer: Medicare Other | Admitting: Physician Assistant

## 2022-11-13 ENCOUNTER — Encounter (HOSPITAL_COMMUNITY): Payer: Medicare Other

## 2022-11-13 ENCOUNTER — Ambulatory Visit (INDEPENDENT_AMBULATORY_CARE_PROVIDER_SITE_OTHER): Payer: Medicare Other

## 2022-11-13 VITALS — BP 124/60 | HR 81 | Temp 98.1°F | Resp 16 | Ht 68.0 in | Wt 168.8 lb

## 2022-11-13 DIAGNOSIS — Z23 Encounter for immunization: Secondary | ICD-10-CM

## 2022-11-13 DIAGNOSIS — Z0000 Encounter for general adult medical examination without abnormal findings: Secondary | ICD-10-CM | POA: Diagnosis not present

## 2022-11-13 NOTE — Patient Instructions (Signed)
Chad Palmer , Thank you for taking time to come for your Medicare Wellness Visit. I appreciate your ongoing commitment to your health goals. Please review the following plan we discussed and let me know if I can assist you in the future.   Referrals/Orders/Follow-Ups/Clinician Recommendations: No  This is a list of the screening recommended for you and due dates:  Health Maintenance  Topic Date Due   Zoster (Shingles) Vaccine (1 of 2) 09[birth year]05/1962   COVID-19 Vaccine (3 - Moderna risk series) 05/01/2019   Flu Shot  09/07/2022   Medicare Annual Wellness Visit  11/10/2022   Colon Cancer Screening  02/19/2024   DTaP/Tdap/Td vaccine (3 - Td or Tdap) 11/26/2030   Pneumonia Vaccine  Completed   Hepatitis C Screening  Completed   HPV Vaccine  Aged Out    Advanced directives: (Copy Requested) Please bring a copy of your health care power of attorney and living will to the office to be added to your chart at your convenience.  Next Medicare Annual Wellness Visit scheduled for next year: No

## 2022-11-13 NOTE — Progress Notes (Addendum)
Subjective:   Chad Palmer is a 77 y.o. male who presents for Medicare Annual/Subsequent preventive examination.  Visit Complete: In person  Patient Medicare AWV questionnaire was completed by the patient on 11/09/2022; I have confirmed that all information answered by patient is correct and no changes since this date.  Cardiac Risk Factors include: advanced age (>81men, >8 women);hypertension;male gender;family history of premature cardiovascular disease;sedentary lifestyle     Objective:    Today's Vitals   11/13/22 1514  BP: 124/60  Pulse: 81  Resp: 16  Temp: 98.1 F (36.7 C)  TempSrc: Temporal  SpO2: 98%  Weight: 168 lb 12.8 oz (76.6 kg)  Height: 5\' 8"  (1.727 m)  PainSc: 0-No pain   Body mass index is 25.67 kg/m.     11/13/2022    4:06 PM 08/01/2022    8:01 AM 11/09/2021    3:40 PM 07/20/2020   12:36 PM 12/22/2019    1:11 PM 12/15/2019    1:48 PM 07/04/2019    9:07 AM  Advanced Directives  Does Patient Have a Medical Advance Directive? Yes Yes Yes Yes Yes  Yes  Type of Estate agent of [place];Living will Healthcare Power of [place];Living will Healthcare Power of [place];Living will Living will;Healthcare Power of [employer] Power of [place];Living will Healthcare Power of [place];Living will Healthcare Power of [place];Living will  Does patient want to make changes to medical advance directive?  No - Patient declined  No - Patient declined No - Patient declined  No - Patient declined  Copy of Healthcare Power of Attorney in Chart? No - copy requested No - copy requested No - copy requested No - copy requested   No - copy requested    Current Medications (verified) Outpatient Encounter Medications as of 11/13/2022  Medication Sig   aspirin EC (ASPIRIN LOW DOSE) 81 MG tablet TAKE 1 TABLET BY MOUTH DAILY SWALLOW WHOLE   atorvastatin (LIPITOR) 40 MG tablet Take 1 tablet (40 mg total) by mouth daily.   Calcium-Vitamin D-Vitamin K  (CALCIUM + D + K PO) Take 1 tablet by mouth daily. (Patient not taking: Reported on 10/30/2022)   clopidogrel (PLAVIX) 75 MG tablet Take 1 tablet (75 mg total) by mouth daily.   co-enzyme Q-10 30 MG capsule Take 30 mg by mouth daily.   Cyanocobalamin (B-12) 2500 MCG TABS Take 2,500 mcg by mouth daily. (Patient not taking: Reported on 10/30/2022)   finasteride (PROSCAR) 5 MG tablet Take 1 tablet (5 mg total) by mouth daily.   fluticasone (FLONASE) 50 MCG/ACT nasal spray USE 2 SPRAYS IN EACH NOSTRIL DAILY   folic acid (FOLVITE) 1 MG tablet Take 1 tablet (1 mg total) by mouth daily.   Histamine Dihydrochloride (AUSTRALIAN DREAM ARTHRITIS EX) Apply 1 Application topically daily as needed (pain).   ipratropium (ATROVENT) 0.03 % nasal spray Place 2 sprays into both nostrils 2 (two) times daily as needed for rhinitis.   lidocaine (LIDODERM) 5 % Place 1 patch onto the skin daily as needed (pain). Remove & Discard patch within 12 hours or as directed by MD   Magnesium 250 MG TABS Take 250 mg by mouth daily. (Patient not taking: Reported on 10/30/2022)   nitroGLYCERIN (NITROSTAT) 0.4 MG SL tablet DISSOLVE 1 TAB UNDER TONGUE FOR CHEST PAIN - IF PAIN REMAINS AFTER 5 MIN, CALL 911 AND REPEAT DOSE. MAX 3 TABS IN 15 MINUTES   ofloxacin (OCUFLOX) 0.3 % ophthalmic solution Place 4 drops into both eyes 4 (four) times daily.  Put in both ears - twice daily (4 drops) for 1 week   olmesartan (BENICAR) 20 MG tablet Take 0.5 tablets (10 mg total) by mouth daily.   pantoprazole (PROTONIX) 40 MG tablet TAKE 1 TABLET BY MOUTH DAILY (Patient not taking: Reported on 10/30/2022)   Probiotic Product (ALIGN) 4 MG CAPS Take 4 mg by mouth at bedtime. (Patient not taking: Reported on 10/30/2022)   sildenafil (VIAGRA) 100 MG tablet Take 50 mg by mouth daily as needed for erectile dysfunction.   zolpidem (AMBIEN) 10 MG tablet TAKE 1 TABLET BY MOUTH AT BEDTIME   No facility-administered encounter medications on file as of 11/13/2022.     Allergies (verified) Levofloxacin, Omeprazole, Rapaflo [silodosin], and Bactrim [sulfamethoxazole-trimethoprim]   History: Past Medical History:  Diagnosis Date   Allergic rhinitis    Arthritis    BPH (benign prostatic hyperplasia)    Cancer (HCC)    kidney    Cataract    bilateral    Cervical spinal stenosis    Dr Dohmier   Colon polyp    Diverticulosis of colon    ED (erectile dysfunction)    Elevated PSA    GERD (gastroesophageal reflux disease)    Hemorrhoids    HTN (hypertension)    Microcytosis    Past Surgical History:  Procedure Laterality Date   cataract surg     BIL   CORONARY CTO INTERVENTION N/A 08/01/2022   Procedure: CORONARY CTO INTERVENTION;  Surgeon: Orbie Pyo, MD;  Location: [facility];  Service: Cardiovascular;  Laterality: N/A;   CORONARY ULTRASOUND/IVUS N/A 08/01/2022   Procedure: Coronary Ultrasound/IVUS;  Surgeon: Orbie Pyo, MD;  Location: [facility];  Service: Cardiovascular;  Laterality: N/A;   HEMORRHOID SURGERY     LAPAROSCOPIC NEPHRECTOMY Left 12/22/2019   Procedure: LAPAROSCOPIC RADICAL NEPHRECTOMY;  Surgeon: Heloise Purpura, MD;  Location: [facility];  Service: Urology;  Laterality: Left;   LEFT HEART CATH AND CORONARY ANGIOGRAPHY N/A 08/01/2022   Procedure: LEFT HEART CATH AND CORONARY ANGIOGRAPHY;  Surgeon: Orbie Pyo, MD;  Location: [facility];  Service: Cardiovascular;  Laterality: N/A;   TOENAIL EXCISION  2009   ALL   TONSILLECTOMY     VASECTOMY     WISDOM TOOTH EXTRACTION     Family History  Problem Relation Age of Onset   Alzheimer's disease Mother    Heart disease Father    Alzheimer's disease Brother    Hypertension Other    Colon cancer Neg Hx    Esophageal cancer Neg Hx    Stomach cancer Neg Hx    Rectal cancer Neg Hx    Social History   Socioeconomic History   Marital status: Married    Spouse name: Not on file   Number of children: 2   Years of education: Not on file    Highest education level: Bachelor's degree (e.g., BA, AB, BS)  Occupational History   Occupation: Publisher/ retired    Comment: Racing cars  Tobacco Use   Smoking status: Never   Smokeless tobacco: Never  Vaping Use   Vaping status: Never Used  Substance and Sexual Activity   Alcohol use: No    Alcohol/week: 0.0 standard drinks of alcohol   Drug use: No   Sexual activity: Yes  Other Topics Concern   Not on file  Social History Narrative      Social Determinants of Health   Financial Resource Strain: Low Risk  (11/13/2022)   Overall Financial  Resource Strain (CARDIA)    Difficulty of Paying Living Expenses: Not hard at all  Food Insecurity: No Food Insecurity (11/13/2022)   Hunger Vital Sign    Worried About Running Out of Food in the Last Year: Never true    Ran Out of Food in the Last Year: Never true  Transportation Needs: No Transportation Needs (11/13/2022)   PRAPARE - Administrator, Civil Service (Medical): No    Lack of Transportation (Non-Medical): No  Physical Activity: Insufficiently Active (11/13/2022)   Exercise Vital Sign    Days of Exercise per Week: 2 days    Minutes of Exercise per Session: 30 min  Stress: No Stress Concern Present (11/13/2022)   [employer] of Occupational Health - Occupational Stress Questionnaire    Feeling of Stress : Not at all  Social Connections: Unknown (11/13/2022)   Social Connection and Isolation Panel [NHANES]    Frequency of Communication with Friends and Family: More than three times a week    Frequency of Social Gatherings with Friends and Family: Once a week    Attends Religious Services: Patient declined    Database administrator or Organizations: Yes    Attends Banker Meetings: 1 to 4 times per year    Marital Status: Married    Tobacco Counseling Counseling given: Not Answered   Clinical Intake:  Pre-visit preparation completed: Yes  Pain : No/denies pain Pain Score: 0-No pain      BMI - recorded: 25.67 Nutritional Status: BMI 25 -29 Overweight Nutritional Risks: None Diabetes: No  How often do you need to have someone help you when you read instructions, pamphlets, or other written materials from your doctor or pharmacy?: 1 - Never What is the last grade level you completed in school?: College Graduate  Interpreter Needed?: No  Information entered by :: Ky Rumple N. Zamara Cozad, LPN.   Activities of Daily Living    11/13/2022    3:18 PM 11/09/2022    4:25 PM  In your present state of health, do you have any difficulty performing the following activities:  Hearing? 0 0  Vision? 0 0  Difficulty concentrating or making decisions? 0 0  Walking or climbing stairs? 0 0  Dressing or bathing? 0 0  Doing errands, shopping? 0 0  Preparing Food and eating ? N N  Using the Toilet? N N  In the past six months, have you accidently leaked urine? N N  Do you have problems with loss of bowel control? N N  Managing your Medications? N N  Managing your Finances? N N  Housekeeping or managing your Housekeeping? N N    Patient Care Team: Plotnikov, Georgina Quint, MD as PCP - General (Internal Medicine) Pricilla Riffle, MD as PCP - Cardiology (Cardiology) Mardella Layman, MD as Attending Physician (Gastroenterology) Heloise Purpura, MD as Consulting Physician (Urology) Irene Limbo, DDS as Referring Physician (Dentistry) Valentino Nose Bernestine Amass, MD as Consulting Physician (Nephrology) Read Drivers, MD as Consulting Physician (Otolaryngology)  Indicate any recent Medical Services you may have received from other than [facility] providers in the past year (date may be approximate).     Assessment:   This is a routine wellness examination for Chad Palmer.  Hearing/Vision screen Hearing Screening - Comments:: Patient has hearing difficulty; No hearing aids. Vision Screening - Comments:: Patient does wear corrective lenses/contacts. Cataracts removed. Annual eye exam done by: Jethro Bolus, MD.     Goals Addressed  This Visit's Progress    Client understands the importance of follow-up with providers by attending scheduled visits.        Depression Screen    11/13/2022    4:06 PM 08/22/2022    2:33 PM 08/04/2022    7:53 AM 05/16/2022    1:31 PM 11/09/2021    3:22 PM 04/13/2021    4:06 PM 07/20/2020   12:59 PM  PHQ 2/9 Scores  PHQ - 2 Score 0 0 0 0 0 0 0  PHQ- 9 Score 0 2         Fall Risk    11/13/2022    3:17 PM 11/09/2022    4:25 PM 09/15/2022    2:05 PM 09/13/2022    3:13 PM 09/11/2022    3:05 PM  Fall Risk   Falls in the past year? 1 1 0 0 0  Number falls in past yr: 0 0 0 0 0  Injury with Fall? 1 1 0 0 0  Risk for fall due to :   Impaired balance/gait;Orthopedic patient Impaired balance/gait;Orthopedic patient Impaired balance/gait;Orthopedic patient  Follow up Falls prevention discussed  Falls evaluation completed Falls evaluation completed Falls evaluation completed    MEDICARE RISK AT HOME: Medicare Risk at Home Any stairs in or around the home?: Yes If so, are there any without handrails?: No Home free of loose throw rugs in walkways, pet beds, electrical cords, etc?: Yes Adequate lighting in your home to reduce risk of falls?: Yes Life alert?: Yes Use of a cane, walker or w/c?: No Grab bars in the bathroom?: Yes Shower chair or bench in shower?: Yes Elevated toilet seat or a handicapped toilet?: Yes  TIMED UP AND GO:  Was the test performed?  Yes  Length of time to ambulate 10 feet: 8 sec Gait steady and fast without use of assistive device    Cognitive Function:    07/02/2018    8:26 AM 06/22/2017    8:43 AM  MMSE - Mini Mental State Exam  Orientation to time 5 5  Orientation to Place 5 5  Registration 3 3  Attention/ Calculation 5 5  Recall 3 3  Language- name 2 objects 2 2  Language- repeat 1 1  Language- follow 3 step command 3 3  Language- read & follow direction 1 1  Write a sentence 1 1  Copy design 1 1   Total score 30 30        11/13/2022    3:18 PM 11/09/2021    3:24 PM  6CIT Screen  What Year? 0 points 0 points  What month? 0 points 0 points  What time? 0 points 0 points  Count back from 20 0 points 0 points  Months in reverse 0 points 0 points  Repeat phrase 0 points 0 points  Total Score 0 points 0 points    Immunizations Immunization History  Administered Date(s) Administered   Influenza, High Dose Seasonal PF 12/12/2014, 12/11/2017, 12/11/2017   Influenza,inj,Quad PF,6+ Mos 11/28/2016   Influenza-Unspecified 11/06/2012, 11/06/2013, 11/13/2019, 10/29/2020   Moderna Sars-Covid-2 Vaccination 03/06/2019, 04/03/2019   Pneumococcal Conjugate-13 03/24/2013   Pneumococcal Polysaccharide-23 04/23/2009, 06/22/2015   Td 05/16/2010   Tdap 11/25/2020   Zoster, Live 09/06/2007    TDAP status: Up to date  Flu Vaccine status: Completed at today's visit  Pneumococcal vaccine status: Up to date  Covid-19 vaccine status: Completed vaccines  Qualifies for Shingles Vaccine? Yes   Zostavax completed Yes   Shingrix Completed?: No.  Education has been provided regarding the importance of this vaccine. Patient has been advised to call insurance company to determine out of pocket expense if they have not yet received this vaccine. Advised may also receive vaccine at local pharmacy or Health Dept. Verbalized acceptance and understanding.  Screening Tests Health Maintenance  Topic Date Due   Zoster Vaccines- Shingrix (1 of 2) 09[birth year]05/1964   COVID-19 Vaccine (3 - Moderna risk series) 05/01/2019   INFLUENZA VACCINE  09/07/2022   Medicare Annual Wellness (AWV)  11/13/2023   Colonoscopy  02/19/2024   DTaP/Tdap/Td (3 - Td or Tdap) 11/26/2030   Pneumonia Vaccine 9+ Years old  Completed   Hepatitis C Screening  Completed   HPV VACCINES  Aged Out    Health Maintenance  Health Maintenance Due  Topic Date Due   Zoster Vaccines- Shingrix (1 of 2) 09[birth year]05/1964   COVID-19 Vaccine (3 -  Moderna risk series) 05/01/2019   INFLUENZA VACCINE  09/07/2022    Colorectal cancer screening: Type of screening: Colonoscopy. Completed 02/19/2019. Repeat every 5 years  Lung Cancer Screening: (Low Dose CT Chest recommended if Age 57-80 years, 20 pack-year currently smoking OR have quit w/in 15years.) does not qualify.   Lung Cancer Screening Referral: No  Additional Screening:  Hepatitis C Screening: does qualify; Completed 06/22/2015  Vision Screening: Recommended annual ophthalmology exams for early detection of glaucoma and other disorders of the eye. Is the patient up to date with their annual eye exam?  Yes  Who is the provider or what is the name of the office in which the patient attends annual eye exams? Jethro Bolus, MD. (Recently retired) If pt is not established with a provider, would they like to be referred to a provider to establish care? Yes. Patient has requested records from [facility] and never received.  Dental Screening: Recommended annual dental exams for proper oral hygiene  Diabetic Foot Exam: N/A  Community Resource Referral / Chronic Care Management: CRR required this visit?  No   CCM required this visit?  No     Plan:     I have personally reviewed and noted the following in the patient's chart:   Medical and social history Use of alcohol, tobacco or illicit drugs  Current medications and supplements including opioid prescriptions. Patient is not currently taking opioid prescriptions. Functional ability and status Nutritional status Physical activity Advanced directives List of other physicians Hospitalizations, surgeries, and ER visits in previous 12 months Vitals Screenings to include cognitive, depression, and falls Referrals and appointments  In addition, I have reviewed and discussed with patient certain preventive protocols, quality metrics, and best practice recommendations. A written personalized care plan for preventive  services as well as general preventive health recommendations were provided to patient.     Mickeal Needy, LPN   29/06/6211   After Visit Summary: (In Person-Printed) AVS printed and given to the patient  Nurse Notes: Normal cognitive status assessed by direct observation by this Nurse Health Advisor. No abnormalities found.    Medical screening examination/treatment/procedure(s) were performed by non-physician practitioner and as supervising physician I was immediately available for consultation/collaboration.  I agree with above. Jacinta Shoe, MD

## 2022-11-15 ENCOUNTER — Encounter (HOSPITAL_COMMUNITY): Payer: Medicare Other

## 2022-11-20 NOTE — Progress Notes (Unsigned)
Cardiology Office Note   Date:  11/21/2022   ID:  Chad Palmer, DOB [birth year], MRN [id number]  PCP:  Tresa Garter, MD  Cardiologist:   Dietrich Pates, MD   Pt presents for follow up of CAD     History of Present Illness: Chad Palmer is a 77 y.o. male with a history of CAD, HTN, GERD hx of esophageal stricture (s/p dilation)  .  He is s/p nephrectomy  In April 2024 the pt developed L arm pain with exertion. Also had DOE. I saw I'm for the first time last spring   Set up for  PET/CT which was highly positive for ischemia   The pt went on to ave [facility] in June 2024   He had successful intervention for 100% LAD    In Aug 2024 he had an episode of syncope   EMS found him to be hypotensive  Given fluids  hydrochlorothiazide taken off med list  SInce I saw him in Aug he says he has done wll  Denies CP with activity   Does have occasional LUQ discomfort after eating   MIld   He says his  breathing is OK    He has some cramping in thighs  QUestions if it is lipitor     Current Meds  Medication Sig   aspirin EC (ASPIRIN LOW DOSE) 81 MG tablet TAKE 1 TABLET BY MOUTH DAILY SWALLOW WHOLE   Calcium-Vitamin D-Vitamin K (CALCIUM + D + K PO) Take 1 tablet by mouth daily.   clopidogrel (PLAVIX) 75 MG tablet Take 1 tablet (75 mg total) by mouth daily.   co-enzyme Q-10 30 MG capsule Take 30 mg by mouth daily.   Cyanocobalamin (B-12) 2500 MCG TABS Take 2,500 mcg by mouth daily.   finasteride (PROSCAR) 5 MG tablet Take 1 tablet (5 mg total) by mouth daily.   fluticasone (FLONASE) 50 MCG/ACT nasal spray USE 2 SPRAYS IN EACH NOSTRIL DAILY   folic acid (FOLVITE) 1 MG tablet Take 1 tablet (1 mg total) by mouth daily.   ipratropium (ATROVENT) 0.03 % nasal spray Place 2 sprays into both nostrils 2 (two) times daily as needed for rhinitis.   lidocaine (LIDODERM) 5 % Place 1 patch onto the skin daily as needed (pain). Remove & Discard patch within 12 hours or as directed by MD   nitroGLYCERIN  (NITROSTAT) 0.4 MG SL tablet DISSOLVE 1 TAB UNDER TONGUE FOR CHEST PAIN - IF PAIN REMAINS AFTER 5 MIN, CALL 911 AND REPEAT DOSE. MAX 3 TABS IN 15 MINUTES   olmesartan (BENICAR) 20 MG tablet Take 0.5 tablets (10 mg total) by mouth daily.   Probiotic Product (ALIGN) 4 MG CAPS Take 4 mg by mouth at bedtime.   sildenafil (VIAGRA) 100 MG tablet Take 50 mg by mouth daily as needed for erectile dysfunction.   zolpidem (AMBIEN) 10 MG tablet TAKE 1 TABLET BY MOUTH AT BEDTIME   [DISCONTINUED] atorvastatin (LIPITOR) 40 MG tablet Take 1 tablet (40 mg total) by mouth daily.   [DISCONTINUED] Histamine Dihydrochloride (AUSTRALIAN DREAM ARTHRITIS EX) Apply 1 Application topically daily as needed (pain).   [DISCONTINUED] Magnesium 250 MG TABS Take 250 mg by mouth daily.   [DISCONTINUED] ofloxacin (OCUFLOX) 0.3 % ophthalmic solution Place 4 drops into both eyes 4 (four) times daily. Put in both ears - twice daily (4 drops) for 1 week   [DISCONTINUED] pantoprazole (PROTONIX) 40 MG tablet TAKE 1 TABLET BY MOUTH DAILY     Allergies:  Levofloxacin, Omeprazole, Rapaflo [silodosin], and Bactrim [sulfamethoxazole-trimethoprim]   Past Medical History:  Diagnosis Date   Allergic rhinitis    Arthritis    BPH (benign prostatic hyperplasia)    Cancer (HCC)    kidney    Cataract    bilateral    Cervical spinal stenosis    Dr Dohmier   Colon polyp    Diverticulosis of colon    ED (erectile dysfunction)    Elevated PSA    GERD (gastroesophageal reflux disease)    Hemorrhoids    HTN (hypertension)    Microcytosis     Past Surgical History:  Procedure Laterality Date   cataract surg     BIL   CORONARY CTO INTERVENTION N/A 08/01/2022   Procedure: CORONARY CTO INTERVENTION;  Surgeon: Orbie Pyo, MD;  Location: [facility];  Service: Cardiovascular;  Laterality: N/A;   CORONARY ULTRASOUND/IVUS N/A 08/01/2022   Procedure: Coronary Ultrasound/IVUS;  Surgeon: Orbie Pyo, MD;  Location: [facility]  INVASIVE CV LAB;  Service: Cardiovascular;  Laterality: N/A;   HEMORRHOID SURGERY     LAPAROSCOPIC NEPHRECTOMY Left 12/22/2019   Procedure: LAPAROSCOPIC RADICAL NEPHRECTOMY;  Surgeon: Heloise Purpura, MD;  Location: [facility];  Service: Urology;  Laterality: Left;   LEFT HEART CATH AND CORONARY ANGIOGRAPHY N/A 08/01/2022   Procedure: LEFT HEART CATH AND CORONARY ANGIOGRAPHY;  Surgeon: Orbie Pyo, MD;  Location: [facility];  Service: Cardiovascular;  Laterality: N/A;   TOENAIL EXCISION  2009   ALL   TONSILLECTOMY     VASECTOMY     WISDOM TOOTH EXTRACTION       Social History:  The patient  reports that he has never smoked. He has never used smokeless tobacco. He reports that he does not drink alcohol and does not use drugs.   Family History:  The patient's family history includes Alzheimer's disease in his brother and mother; Heart disease in his father; Hypertension in an other family member.    ROS:  Please see the history of present illness. All other systems are reviewed and  Negative to the above problem except as noted.    PHYSICAL EXAM: VS:  BP 134/80   Pulse (!) 59   Ht 5\' 8"  (1.727 m)   Wt 169 lb 12.8 oz (77 kg)   SpO2 98%   BMI 25.82 kg/m    GEN: Well nourished, well developed, in no acute distress  Neck: no JVD, no carotid bruit Cardiac: RRR; no murmurs  No LE  Respiratory:  clear to auscultation bilaterally GI: soft, nontender, nondistended No hepatomegaly     EKG  SInus bradycardia  59 bpm  Occasional PACs     LHC  August 01 2022   1st Diag lesion is 30% stenosed.   Mid LAD lesion is 100% stenosed.   A stent was successfully placed.   Post intervention, there is a 0% residual stenosis.   1.  Mid LAD chronic total occlusion treated with 2 overlapping drug-eluting stents with cutting balloon angioplasty and IVUS guidance. 2.  LVEDP of 26 mmHg.   Recommendation: 6 hours of IV fluids, same-day discharge with dual antiplatelet therapy consisting of  aspirin and Plavix for 6 months then Plavix monotherapy indefinitely.  PET/CT   JUne 2024     Findings are consistent with ischemia. The study is high risk due to LAD territory ischemia, the presence of TID, and the drop in EF with stress. Recommend coronary catheterization for further evaluation.   LV  perfusion is abnormal. There is evidence of ischemia. Defect 1: There is a large defect with severe reduction in uptake present in the apical to mid anterior, anteroseptal, inferoseptal, septal and apex location(s) that is reversible. There is abnormal wall motion in the defect area. Consistent with ischemia. The defect is consistent with abnormal perfusion in the LAD territory. Defect 2: There is a small defect present in the apical inferior location(s). There is abnormal wall motion in the defect area. Consistent with ischemia.   Rest left ventricular function is normal. Rest EF: 52 %. Stress left ventricular function is normal. Stress EF: 50 %. The EF dropped slightly with stress in the presence of significant ischemia. Recommend cath as detailed above.  End diastolic cavity size is normal. End systolic cavity size is normal.   Myocardial blood flow was computed to be 0.71ml/g/min at rest and 1.74ml/g/min at stress. Global myocardial blood flow reserve was 1.68 and was abnormal. Notably, the MBF was most reduced in the LAD territory which is consistent with the large perfusion defect as detailed above.   Coronary calcium was present on the attenuation correction CT images. Moderate coronary calcifications were present. Coronary calcifications were present in the left anterior descending artery and left circumflex artery distribution(s).   Electronically Signed: Laurance Flatten, MD  Calcium score CT  2021  Coronary calcium score of 180 Agatston units. This was 44th percentile for age and sex matched control, suggesting low to intermediate risk for future cardiac events.  Lipid Panel     Component Value Date/Time   CHOL 201 (H) 05/16/2022 1454   TRIG 147.0 05/16/2022 1454   HDL 40.20 05/16/2022 1454   CHOLHDL 5 05/16/2022 1454   VLDL 29.4 05/16/2022 1454   LDLCALC 131 (H) 05/16/2022 1454      Wt Readings from Last 3 Encounters:  11/21/22 169 lb 12.8 oz (77 kg)  11/13/22 168 lb 12.8 oz (76.6 kg)  10/30/22 171 lb 9.6 oz (77.8 kg)      ASSESSMENT AND PLAN:  1  CAD  S/p PTCA/DES to LAD earlier in spring   Doing well  No symptoms of agnina  2  HL  Last LDL 27 in Aug 2024   With symptoms of cramps in thighs I have asked him to stop lipitor and see if gets better   Respond via mychart in a few wks   3  Dizziness   PT denies Resolved after change in meds     4  HTN   BP is a litt up   Home cuff currently reads high    It is better athome    I have asked him to get a cuff   Follow    5  HL    27  HDL 46  Trig 52.  Keep on Repatha   6  Renal  S/p nephrectomy for renal CA  Cr 1.71 in Aug 2024    OK to resume supplements (Vit B12, Vit D /K2 )  Hold Mg   See if symptoms improve in next week .  Current medicines are reviewed at length with the patient today.  The patient does not have concerns regarding medicines.  Signed, Dietrich Pates, MD  11/21/2022 10:03 PM    [facility] [address] [place], [place], [place]  [address] Phone: [phone]; Fax: [phone]

## 2022-11-21 ENCOUNTER — Encounter: Payer: Self-pay | Admitting: Internal Medicine

## 2022-11-21 ENCOUNTER — Ambulatory Visit: Payer: Medicare Other | Attending: Internal Medicine | Admitting: Internal Medicine

## 2022-11-21 VITALS — BP 134/80 | HR 59 | Ht 68.0 in | Wt 169.8 lb

## 2022-11-21 DIAGNOSIS — D649 Anemia, unspecified: Secondary | ICD-10-CM | POA: Insufficient documentation

## 2022-11-21 DIAGNOSIS — D509 Iron deficiency anemia, unspecified: Secondary | ICD-10-CM | POA: Diagnosis not present

## 2022-11-21 DIAGNOSIS — I10 Essential (primary) hypertension: Secondary | ICD-10-CM | POA: Insufficient documentation

## 2022-11-21 DIAGNOSIS — R079 Chest pain, unspecified: Secondary | ICD-10-CM | POA: Insufficient documentation

## 2022-11-21 DIAGNOSIS — E559 Vitamin D deficiency, unspecified: Secondary | ICD-10-CM | POA: Diagnosis not present

## 2022-11-21 DIAGNOSIS — I2510 Atherosclerotic heart disease of native coronary artery without angina pectoris: Secondary | ICD-10-CM | POA: Insufficient documentation

## 2022-11-21 NOTE — Patient Instructions (Signed)
Medication Instructions:   HOLD LIPITOR HOLD MAGNESIUM RESTART VIT D WITH K    *If you need a refill on your cardiac medications before your next appointment, please call your pharmacy*   Lab Work: CBC, BMET, VIT D, MAG IN 2 MONTHS NON-FASTING If you have labs (blood work) drawn today and your tests are completely normal, you will receive your results only by: MyChart Message (if you have MyChart) OR A paper copy in the mail If you have any lab test that is abnormal or we need to change your treatment, we will call you to review the results.   Testing/Procedures:    Follow-Up: At [facility], you and your health needs are our priority.  As part of our continuing mission to provide you with exceptional heart care, we have created designated Provider Care Teams.  These Care Teams include your primary Cardiologist (physician) and Advanced Practice Providers (APPs -  Physician Assistants and Nurse Practitioners) who all work together to provide you with the care you need, when you need it.  We recommend signing up for the patient portal called "MyChart".  Sign up information is provided on this After Visit Summary.  MyChart is used to connect with patients for Virtual Visits (Telemedicine).  Patients are able to view lab/test results, encounter notes, upcoming appointments, etc.  Non-urgent messages can be sent to your provider as well.   To learn more about what you can do with MyChart, go to [email or web address].    Your next appointment:  [facility] 2025

## 2022-11-22 DIAGNOSIS — L814 Other melanin hyperpigmentation: Secondary | ICD-10-CM | POA: Diagnosis not present

## 2022-11-22 DIAGNOSIS — L570 Actinic keratosis: Secondary | ICD-10-CM | POA: Diagnosis not present

## 2022-11-22 DIAGNOSIS — D1801 Hemangioma of skin and subcutaneous tissue: Secondary | ICD-10-CM | POA: Diagnosis not present

## 2022-11-22 DIAGNOSIS — D225 Melanocytic nevi of trunk: Secondary | ICD-10-CM | POA: Diagnosis not present

## 2022-11-22 DIAGNOSIS — I788 Other diseases of capillaries: Secondary | ICD-10-CM | POA: Diagnosis not present

## 2022-11-27 ENCOUNTER — Telehealth: Payer: Self-pay | Admitting: Audiologist

## 2022-11-28 NOTE — Addendum Note (Signed)
Encounter addended by: [place], Alantis Bethune G, RD on: 11/28/2022 2:57 PM  Actions taken: Flowsheet data copied forward, Flowsheet accepted

## 2022-11-29 ENCOUNTER — Ambulatory Visit: Payer: Medicare Other | Attending: Otolaryngology | Admitting: Audiologist

## 2022-11-29 ENCOUNTER — Other Ambulatory Visit: Payer: Self-pay | Admitting: Internal Medicine

## 2022-11-29 ENCOUNTER — Encounter (HOSPITAL_COMMUNITY): Payer: Self-pay | Admitting: *Deleted

## 2022-11-29 ENCOUNTER — Telehealth (HOSPITAL_COMMUNITY): Payer: Self-pay | Admitting: *Deleted

## 2022-11-29 DIAGNOSIS — R42 Dizziness and giddiness: Secondary | ICD-10-CM | POA: Insufficient documentation

## 2022-11-29 DIAGNOSIS — Z955 Presence of coronary angioplasty implant and graft: Secondary | ICD-10-CM

## 2022-11-29 DIAGNOSIS — H903 Sensorineural hearing loss, bilateral: Secondary | ICD-10-CM | POA: Insufficient documentation

## 2022-11-29 NOTE — Progress Notes (Signed)
Discharge Progress Report  Patient Details  Name: Chad Palmer MRN: [id number] Date of Birth: 09/29/1945 Referring Provider:   Flowsheet Row INTENSIVE CARDIAC REHAB ORIENT from 08/22/2022 in [facility]  Referring Provider Dietrich Pates, MD        Number of Visits: 20  Reason for Discharge:  Early Exit:  medical reasons   Smoking History:  Social History   Tobacco Use  Smoking Status Never  Smokeless Tobacco Never    Diagnosis:  08/01/22 Status post coronary artery stent placement LAD  ADL UCSD:   Initial Exercise Prescription:   Discharge Exercise Prescription (Final Exercise Prescription Changes):   Functional Capacity:   Psychological, QOL, Others - Outcomes: PHQ 2/9:    11/13/2022    4:06 PM 08/22/2022    2:33 PM 08/04/2022    7:53 AM 05/16/2022    1:31 PM 11/09/2021    3:22 PM  Depression screen PHQ 2/9  Decreased Interest 0 0 0 0 0  Down, Depressed, Hopeless 0 0 0 0 0  PHQ - 2 Score 0 0 0 0 0  Altered sleeping 0 1     Tired, decreased energy 0 1     Change in appetite 0 0     Feeling bad or failure about yourself  0 0     Trouble concentrating 0 0     Moving slowly or fidgety/restless 0 0     Suicidal thoughts 0 0     PHQ-9 Score 0 2     Difficult doing work/chores Not difficult at all Somewhat difficult       Quality of Life:   Personal Goals: Goals established at orientation with interventions provided to work toward goal.    Personal Goals Discharge:  Goals and Risk Factor Review     Row Name 10/03/22 1516 11/01/22 0920 11/29/22 0859         Core Components/Risk Factors/Patient Goals Review   Personal Goals Review Weight Management/Obesity;Hypertension;Lipids;Stress Weight Management/Obesity;Hypertension;Lipids;Stress Weight Management/Obesity;Hypertension;Lipids;Stress     Review [name] last day of  cardiac rehab was on 09/15/22. Exercise is currently on hold [name] last day of   cardiac rehab was on 09/15/22. Exercise remains on hold. I spoke with [name] he still wants to return to exercise [name] last day of  cardiac rehab was on 09/15/22. [name] has been disvcharged from cardiac rehab as he does not plan to return     Expected Outcomes [name] will continue to participate in intensive cardiac rehab for exercise, nutrition and lifestyle modifications [name] will continue to participate in intensive cardiac rehab for exercise, nutrition and lifestyle modifications [name] will continue to participate in intensive cardiac rehab for exercise, nutrition and lifestyle modifications              Exercise Goals and Review:   Exercise Goals Re-Evaluation:   Nutrition & Weight - Outcomes:    Nutrition:   Nutrition Discharge:   Education Questionnaire Score:  [name]  completed  20  exercise and education sessions. Pt maintained good attendance and progressed nicely during their participation in rehab as evidenced by increased MET level.     Pt plans to continue exercise at home riding his stationary bike and walking. [name] exercise was on hold due to continued issues with feeling lightheaded. [name] says this has improved since his medications have been adjusted. [name] said that cardiac rehab has been helpful for him the short time he participated in the program.Chad Palmer  Chad Palmer RN BSN

## 2022-11-29 NOTE — Procedures (Signed)
Outpatient Audiology and [facility] [address] [place], [place]  [address] [phone]  AUDIOLOGICAL  EVALUATION  NAME: Chad Palmer     DOB:   March 11, 1945      MRN: [id number]                                                                                     DATE: 11/29/2022     REFERENT: Tresa Garter, MD STATUS: Outpatient DIAGNOSIS: Sensorineural Hearing Loss Bilateral, Lightheadedness    History: Chad Palmer was seen for an audiological evaluation due to difficulty hearing in noise and dizziness. Chad Palmer is having a hard time understanding people, especially in restaurants or crowds. He thinks people sound muffled. He also is feeling dizzy. He gets an occasional quick stab of pain in his ears. He has occasional ringing that lasts less than a minute then fades. The dizziness started around two months ago. He said his doctors now feel its all the new blood thinners and blood pressure medications he has been on since his heart attack. 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 He has had significant occupational noise exposure.  Chad Palmer also has itchy ears, and is using drops recommended by Dr. Allena Katz. No other case history reported.    Evaluation:  Otoscopy showed a clear view of the tympanic membranes, bilaterally. Canals red and dry with no cerumen bilaterally.  Tympanometry results were consistent with normal middle ear function, bilaterally   Audiometric testing was completed using Conventional Audiometry techniques with insert earphones and TDH headphones. Test results are consistent with normal hearing sloping after 2kHz to a moderate high pitched sensorineural noise notched hearing loss bilaterally. Speech Recognition Thresholds were obtained at 25dB HL in the right ear and at 30dB HL in the left ear. Word Recognition Testing was completed at 40dB SL and Chad Palmer scored 100% in the right ear  and 92% in the left ear.   Results:  The test results were reviewed with Chad Palmer. He has hearing loss that is typical for age and noise exposure related changes. His hearing is symmetric. His middle ear function is normal bilaterally. He needs hearing aids due to the moderate high frequency sensorineural hearing loss. He was given copies of his audiogram.   Recommendations: 1.   Hearing aids recommended for both ears. See handout of local providers.    32 minutes spent testing and counseling on results.   If you have any questions please feel free to contact me at [phone].  Ammie Ferrier Audiologist, Au.D., CCC-A 11/29/2022  8:45 AM  Cc: Tresa Garter, MD

## 2022-11-29 NOTE — Telephone Encounter (Signed)
Spoke with Chad Palmer he does not plan to return to cardiac rehab due to previous commitments. Will discharge from cardiac rehab at this time. Chad Palmer attended 20 exercise sessions.[name] RN BSN

## 2022-11-30 ENCOUNTER — Encounter (INDEPENDENT_AMBULATORY_CARE_PROVIDER_SITE_OTHER): Payer: Self-pay

## 2022-11-30 ENCOUNTER — Ambulatory Visit (INDEPENDENT_AMBULATORY_CARE_PROVIDER_SITE_OTHER): Payer: Medicare Other | Admitting: Otolaryngology

## 2022-11-30 VITALS — Ht 68.0 in | Wt 169.0 lb

## 2022-11-30 DIAGNOSIS — R0982 Postnasal drip: Secondary | ICD-10-CM | POA: Diagnosis not present

## 2022-11-30 DIAGNOSIS — H903 Sensorineural hearing loss, bilateral: Secondary | ICD-10-CM | POA: Diagnosis not present

## 2022-11-30 DIAGNOSIS — J3489 Other specified disorders of nose and nasal sinuses: Secondary | ICD-10-CM

## 2022-11-30 DIAGNOSIS — R42 Dizziness and giddiness: Secondary | ICD-10-CM

## 2022-11-30 NOTE — Progress Notes (Signed)
Otolaryngology Clinic Note Referring physician:  Dr. Posey Rea HPI:  Chad Palmer is a 77 y.o. male kindly referred by Dr. Posey Rea for evaluation of multiple complaints, principal of which is lightheadedness and concern for vertigo.  Initial visit: Started after he had stents placed in June for Cardiac Ischemia. Since then, he has felt lightheaded. He reports that what he describes as "dizziness" is actually lightheadedness. He reports that it often occurs when he "gets up" or "unsteady" but denies Nausea or vomiting or episodic ear fullness or episodic hearing loss or autophony. Most of the time this lasts for 20-30 minutes, where he has to sit down and drink water. Every once in a while, he will lay down but it resolves by itself. No history of migraine. He does report that his hearing is a bit muffled, and his ears also itch. He will occasionally get a sharp pain, and occassionally will have ringing (once every couple of weeks). Tinnitus is not pulsatile. He does use Q-tips every day. Pain and ringing lasts about 4-5 minutes. No drainage from the ear, pressure in the ear.  Denies prior ear infections, surgery, barotrauma, vestibular suppressant use. He denies other eustachian tube symptoms such as pressure In ear, popping, symptoms brought about by pressure changes. Does have hearing loss (chronic) as he used to work on [employer].   He also reports some nasal dripping after he eats. Does not matter which kind of foods. He does flonase regularly for allergies (every morning) but otherwise no other medications for sinuses. Occasional PO antihistamine. Denies recent sinus infections, hyposmia, sinus pressure or pain, anterior rhinorrhea.  No recent sinus CT; MRI Head 10/02/2022: No obvious mastoid effusions noted, evaluation limited given character of exam.   Today: Ears have not itched after drops. Not as lightheaded, no pain/pressure, doing better. They took him off lipitor for leg cramps and  that seems to have resolved the dizziness. Just a little lightheaded but not dizzy at all. Have not had ringing. No significant dripping since lst visit. He did see cardiology. Audio was done but has not done vestibular testing which he would like to do.   PMHx: BPH, Kidney Cancer (only one kidney), Cervical spine stenosis, HTN, HLD, CADs/p PTC/DES to LAD (on Plavix and ASA), GERD.    PMH/Meds/All/SocHx/FamHx/ROS:   Past Medical History:  Diagnosis Date   Allergic rhinitis    Arthritis    BPH (benign prostatic hyperplasia)    Cancer (HCC)    kidney    Cataract    bilateral    Cervical spinal stenosis    Dr Dohmier   Colon polyp    Diverticulosis of colon    ED (erectile dysfunction)    Elevated PSA    GERD (gastroesophageal reflux disease)    Hemorrhoids    HTN (hypertension)    Microcytosis     Past Surgical History:  Procedure Laterality Date   cataract surg     BIL   CORONARY CTO INTERVENTION N/A 08/01/2022   Procedure: CORONARY CTO INTERVENTION;  Surgeon: Orbie Pyo, MD;  Location: [facility];  Service: Cardiovascular;  Laterality: N/A;   CORONARY ULTRASOUND/IVUS N/A 08/01/2022   Procedure: Coronary Ultrasound/IVUS;  Surgeon: Orbie Pyo, MD;  Location: [facility];  Service: Cardiovascular;  Laterality: N/A;   HEMORRHOID SURGERY     LAPAROSCOPIC NEPHRECTOMY Left 12/22/2019   Procedure: LAPAROSCOPIC RADICAL NEPHRECTOMY;  Surgeon: Heloise Purpura, MD;  Location: [facility];  Service: Urology;  Laterality: Left;  LEFT HEART CATH AND CORONARY ANGIOGRAPHY N/A 08/01/2022   Procedure: LEFT HEART CATH AND CORONARY ANGIOGRAPHY;  Surgeon: Orbie Pyo, MD;  Location: [facility];  Service: Cardiovascular;  Laterality: N/A;   TOENAIL EXCISION  2009   ALL   TONSILLECTOMY     VASECTOMY     WISDOM TOOTH EXTRACTION      Family History  Problem Relation Age of Onset   Alzheimer's disease Mother    Heart disease Father    Alzheimer's disease  Brother    Hypertension Other    Colon cancer Neg Hx    Esophageal cancer Neg Hx    Stomach cancer Neg Hx    Rectal cancer Neg Hx    No family history of bleeding disorders or difficulty with anesthesia  Social Connections: Unknown (11/13/2022)   Social Connection and Isolation Panel [NHANES]    Frequency of Communication with Friends and Family: More than three times a week    Frequency of Social Gatherings with Friends and Family: Once a week    Attends Religious Services: Patient declined    Database administrator or Organizations: Yes    Attends Banker Meetings: 1 to 4 times per year    Marital Status: Married     Current Outpatient Medications:    aspirin EC (ASPIRIN LOW DOSE) 81 MG tablet, TAKE 1 TABLET BY MOUTH DAILY SWALLOW WHOLE, Disp: 90 tablet, Rfl: 3   Calcium-Vitamin D-Vitamin K (CALCIUM + D + K PO), Take 1 tablet by mouth daily., Disp: , Rfl:    clopidogrel (PLAVIX) 75 MG tablet, Take 1 tablet (75 mg total) by mouth daily., Disp: 30 tablet, Rfl: 6   co-enzyme Q-10 30 MG capsule, Take 30 mg by mouth daily., Disp: , Rfl:    Cyanocobalamin (B-12) 2500 MCG TABS, Take 2,500 mcg by mouth daily., Disp: , Rfl:    finasteride (PROSCAR) 5 MG tablet, Take 1 tablet (5 mg total) by mouth daily., Disp: 90 tablet, Rfl: 3   fluticasone (FLONASE) 50 MCG/ACT nasal spray, USE 2 SPRAYS IN EACH NOSTRIL DAILY, Disp: 16 mL, Rfl: 0   folic acid (FOLVITE) 1 MG tablet, Take 1 tablet (1 mg total) by mouth daily., Disp: 90 tablet, Rfl: 1   ipratropium (ATROVENT) 0.03 % nasal spray, Place 2 sprays into both nostrils 2 (two) times daily as needed for rhinitis., Disp: 30 mL, Rfl: 12   lidocaine (LIDODERM) 5 %, Place 1 patch onto the skin daily as needed (pain). Remove & Discard patch within 12 hours or as directed by MD, Disp: , Rfl:    nitroGLYCERIN (NITROSTAT) 0.4 MG SL tablet, DISSOLVE 1 TABLET UNDER TONGUE FOR CHEST PAIN - IF PAIN REMAINS AFTER 5 MIN, CALL 911 AND REPEAT DOSE. MAX 3  TABLETS IN 15 MINUTES, Disp: 25 tablet, Rfl: 0   olmesartan (BENICAR) 20 MG tablet, Take 0.5 tablets (10 mg total) by mouth daily., Disp: 45 tablet, Rfl: 3   Probiotic Product (ALIGN) 4 MG CAPS, Take 4 mg by mouth at bedtime., Disp: , Rfl:    sildenafil (VIAGRA) 100 MG tablet, Take 50 mg by mouth daily as needed for erectile dysfunction., Disp: , Rfl:    zolpidem (AMBIEN) 10 MG tablet, TAKE 1 TABLET BY MOUTH AT BEDTIME, Disp: 90 tablet, Rfl: 1  A 10-point ROS was performed with pertinent positives/negatives noted in the HPI   Physical Exam:   Ht 5\' 8"  (1.727 m)   Wt 169 lb (76.7 kg)  BMI 25.70 kg/m   Salient findings:  CN II-XII intact Bilateral EAC clear and TM intact with well pneumatized middle ear spaces; improved canal appearance - no excoriation; no nystagmus Weber 512: midline Rinne 512: AC > BC b/l  Rine 1024: AC > BC b/l  Anterior rhinoscopy: Septum relatively midline; bilateral inferior turbinates without hypertrophy No lesions of oral cavity/oropharynx No obviously palpable neck masses/lymphadenopathy/thyromegaly No respiratory distress or stridor Head shake negative; Romberg negative; normal Gait  Independent Review of Additional Tests or Records:  See above; PCP notes and Cardiology notes independently reviewed as above; MRI - not optimal for evaluation for ears - as noted above also independently reviewed and summarized with particular attention to ear 11/2022 Audiogram was independently reviewed and interpreted by me and it reveals AU: b/l normal downsloping to moderate-severe SNHL (around 4000 Hz) then upsloping to mild; WRT 100% AD, 92% AS; appears type A tymps b/l; symmetric   SNHL= Sensorineural hearing loss   Procedures:  None  Impression & Plans:  Chad Palmer is a 77 y.o. male with indly referred by Dr. Posey Rea for evaluation of multiple complaints, principal of which is lightheadedness. It does not appear to be frank vertigo, and the history is  suspicious for a cardiac or other etiology (medication? Metabolic?Neurologic?) rather than peripheral vestibular etiology as he reports that he feels "unsteady" when getting up and has to lay down. He does have some ear complaints such as muffled hearing and itchy ears and occassional sharp pain, but I suspect this is due to eczematoid OE rather than related to peripheral "vertigo".   We decided to Rx OE and obtain audio, which shows b/l symmetric SNHL, most likely presbycusis given prior MRI. After medication changes, his lightheadedness has significantly improved and he is asymptomatic from eczematoid OE standpoint; he is having some lightheadedness but this is rare now and we discussed that given this, it puts a stronger suspicion on a non-otologic cause but can r/o peripheral vestibular etiology with vestibular testing  Bilateral symmetric SNHL and lightheadednes - Based on audio; he wishes to go to [facility] for HA which is reasonable - Vestibular evaluation - still interested in this and ordered - If unrevealing, especially given above, think neuro or repeat cardiac evaluation  2. Ear canal excoriation and b/l mild eczematoid OE - resolved - ofloxacin 0.3% BID PRN now; resolved; now discontinue and can use PRN  3. Post nasal drip and anterior rhinorrhea wiith eating - no other significant nasal symptoms; no typical AR symptoms; reports is it bothersome  - rec atrovent 0.03% 2 puffs before eating PRN; can start with that  - improved and resolved  MDM:  Level 4 Complexity/Problems addressed: mod Data complexity: mod - independent interpretation - Morbidity: mod - Prescription Drug prescribed or managed: yes    Thank you for allowing me the opportunity to care for your patient. Please do not hesitate to contact me should you have any other questions.  Sincerely, Jovita Kussmaul, MD Otolarynoglogist (ENT), [facility] Phone: [phone] Fax: [phone]  11/30/2022, 11:41  AM

## 2022-12-22 ENCOUNTER — Encounter (INDEPENDENT_AMBULATORY_CARE_PROVIDER_SITE_OTHER): Payer: Medicare Other | Admitting: Audiology

## 2022-12-27 ENCOUNTER — Ambulatory Visit (INDEPENDENT_AMBULATORY_CARE_PROVIDER_SITE_OTHER): Payer: Medicare Other | Admitting: Audiology

## 2022-12-27 DIAGNOSIS — R42 Dizziness and giddiness: Secondary | ICD-10-CM | POA: Diagnosis not present

## 2022-12-29 NOTE — Progress Notes (Signed)
[address], [address] [place], [place] [address] [phone]  Vestibular Evaluation    Name: Chad Palmer     DOB:   01-Jan-1946      MRN:   [id number]                                                                                     Service Date: 12/29/2022     Accompanied by: none    Patient comes today after Dr. Allena Katz, ENT sent a referral for a vestibular evaluation due to concerns with balance.   Case History:   The patient was referred to ENT by his cardiologist because of concerns with lightheadedness. The patient lost consciousness and fell in June 2024 after he had heart surgery. He reports he does not remember any symptom before he collapsed. The patient reports that his cardiologist has adjusted his blood pressure medications and he saw an improvement.   Patient reports a sensation of dizziness that started approximately 3 months ago. He describes the dizziness as lightheadedness. He reports blurry vision as an associated symptom, also reports that he is light sensitive. Patient reports that it comes and goes, happens about every week and lasts 1-2 minutes. Patient noted that nothing unusual happens when they roll over in bed.  Patient reports as an exacerbating factor taking all medications in the morning. Patient reports that he has forgotten to take the blood pressure medications and has felt better.  Today the patient denied taking any relevant medications within the past 48 hours, except for a baby aspirin. Additional medical history pertinent to the vestibular assessment includes hypertension, heart surgery in June 2024, tingling/numbness before the heart surgery, one kidney was removed due to cancer, arthritis in his back, neck and back issues.  Patient's hearing was tested on 11-29-2022 and results indicated: Normal hearing from [phone], sloping after 2kHz to a moderate high pitched sensorineural noise notched hearing loss bilaterally.  Museum/gallery curator (SOP) Test: Romberg (Conditions 1-2): WNL Tandem Romberg (Conditions 3-4): WNL CTSIB (Conditions 5-6): WNL Fukuda (Condition 7): WNL   Electrophysiology: Auditory Brainstem Response (ABR): Normal in the right ear. Essentially within normal limits, except for poor morphology and absent wave V when tested at an increased rate. Electrocochleography (ECochG):Inconclusive in both ears. Cervical Vestibular-Evoked Myogenic Potential (cVEMP): Present in both ears.  Videonystagmography (VNG): Ocular Motility Tests Gaze: WNL Smooth Pursuit: low gain Saccades: slow velocity OPK: low gain  Spontaneous Nystagmus Sitting: None Supine: None Head Shake (Active): None  Positional Tests Head Right: WNL Head Left: WNL Body Right: DNT Body Left: DNT  Positioning Tests Head Hanging Right: WNL Head Hanging Left: WNL Dix-Hallpike/Side Lying Right: Negative for BPPV Dix-Hallpike/Side Lying Left: Negative for BPPV  Bithermal Air Calorics Unilateral Weakness: 11% (<25% WNL), Left weaker Directional Preponderance: 11% (<30% WNL),  Right stronger Bilateral Weakness: 61deg/s (>24 deg/s WNL) Fixation Index: Normal  Rotary Chair: Sinusoidal Harmonic Acceleration (SHA) Frequencies Tested: 0.01, 0.02, 0.04, 0.08, 0.16, 0.32, 0.64 Hz Gain: Low gain for all frequencies tested. Phase: Phase lead for all frequencies tested, except when normal at .16Hz . Symmetry: WNL  Step Velocity Test (SVT) Gain: WNL Time Constant:  WNL Time Constant Asymmetry: WNL     Conclusions: Abnormal vestibular assessment with essentially central indicators.   1. Normal Gans SOP Test results suggest a balance between visual, somatosensory, and vestibular inputs for dynamic postural control.  2. Normal ABR results in the right ear suggest good function of the cochlear nerve. Normal ABR results in the right ear suggest good function of the cochlear nerve. Of note, poor morphology and replicability  in the left ear with that fast rate may be a result of hearing loss, or possible retrocochlear lesion. 3. Inconclusive results for ECochG in both ears. Cannot rule-out elevated labyrinthine pressure or endolymphatic hydrops in either ear. 4. Normal cVEMP results in both ears suggest intact function of the saccules and inferior vestibular nerves. 5. Abnormal ocular motility suggest central involvement. No clinically significant nystagmus was observed during positional and positioning tests.Normal head shake test results do not suggest an imbalance of vestibular inputs. Normal bithermal calorics  do not suggest  an unilateral or bilateral weakness affecting the lateral semicircular canals and/or superior vestibular nerves in either ear.  6. Abnormal phase lead for [facility] suggests central involvement. Normal SVT suggests an intact velocity storage mechanism.       Recommendations: 1. Consider imaging to rule out possible retrocochlear site of lesion. 2. Referral to physical therapy for vestibular rehabilitation. 3. Referral to neurology. 4. Follow-up appointment with Dr. Allena Katz at [facility] for further discussion and recommendations.    Audiologist: Merrily Pew, AUD Date of Service: 12-27-2022

## 2023-01-03 DIAGNOSIS — R972 Elevated prostate specific antigen [PSA]: Secondary | ICD-10-CM | POA: Diagnosis not present

## 2023-01-08 ENCOUNTER — Telehealth (INDEPENDENT_AMBULATORY_CARE_PROVIDER_SITE_OTHER): Payer: Self-pay | Admitting: Otolaryngology

## 2023-01-08 ENCOUNTER — Encounter: Payer: Self-pay | Admitting: Internal Medicine

## 2023-01-08 NOTE — Telephone Encounter (Signed)
Patient wants to make an appointment to discuss results from vestibular testing.

## 2023-01-10 ENCOUNTER — Telehealth (INDEPENDENT_AMBULATORY_CARE_PROVIDER_SITE_OTHER): Payer: Self-pay | Admitting: Otolaryngology

## 2023-01-10 ENCOUNTER — Ambulatory Visit: Payer: Medicare Other | Attending: Internal Medicine

## 2023-01-10 DIAGNOSIS — R3915 Urgency of urination: Secondary | ICD-10-CM | POA: Diagnosis not present

## 2023-01-10 DIAGNOSIS — I10 Essential (primary) hypertension: Secondary | ICD-10-CM | POA: Diagnosis not present

## 2023-01-10 DIAGNOSIS — R079 Chest pain, unspecified: Secondary | ICD-10-CM

## 2023-01-10 DIAGNOSIS — N401 Enlarged prostate with lower urinary tract symptoms: Secondary | ICD-10-CM | POA: Diagnosis not present

## 2023-01-10 DIAGNOSIS — E559 Vitamin D deficiency, unspecified: Secondary | ICD-10-CM

## 2023-01-10 DIAGNOSIS — I2510 Atherosclerotic heart disease of native coronary artery without angina pectoris: Secondary | ICD-10-CM | POA: Diagnosis not present

## 2023-01-10 DIAGNOSIS — R972 Elevated prostate specific antigen [PSA]: Secondary | ICD-10-CM | POA: Diagnosis not present

## 2023-01-10 DIAGNOSIS — D509 Iron deficiency anemia, unspecified: Secondary | ICD-10-CM

## 2023-01-10 DIAGNOSIS — D649 Anemia, unspecified: Secondary | ICD-10-CM

## 2023-01-10 DIAGNOSIS — Z85528 Personal history of other malignant neoplasm of kidney: Secondary | ICD-10-CM | POA: Diagnosis not present

## 2023-01-10 NOTE — Telephone Encounter (Signed)
ENT Note: I discussed the vestibular testing findings noted below with Mr. Wirkkala.  We discussed that given his findings, it does seem to be suggestive of central etiology, and lack of replicability on left ABR could suggest retrocochlear lesion. We discussed his options: MRI IAC, Vestibular rehab, and neuro referral. I do not suspect a retrocochlear lesion as he is otherwise asymptomatic and never had vertigo and with symmetric hearing thresholds, but certainly cannot rule it out without MRI He reports that he is doing very well without any otologic symptoms or vertigo. We discussed R/B/A and he would like to forego the MRI or a repeat hearing test or neuro referral. He was not interested in rehab given lack of symptoms. I also offered him a follow up, but he reports he understands the risks and would like to just follow up as needed, should he note any change in hearing or symptoms. I advised that should he have persistent balance, or otologic symptoms including decline in hearing, facial numbness or weakness, he should call us or be referred to Neurology.  He understood and was appreciative of the call  Conclusions: Abnormal vestibular assessment with essentially central indicators.     1. Normal Gans SOP Test results suggest a balance between visual, somatosensory, and vestibular inputs for dynamic postural control.  2. Normal ABR results in the right ear suggest good function of the cochlear nerve. Normal ABR results in the right ear suggest good function of the cochlear nerve. Of note, poor morphology and replicability in the left ear with that fast rate may be a result of hearing loss, or possible retrocochlear lesion. 3. Inconclusive results for ECochG in both ears. Cannot rule-out elevated labyrinthine pressure or endolymphatic hydrops in either ear. 4. Normal cVEMP results in both ears suggest intact function of the saccules and inferior vestibular nerves. 5. Abnormal ocular motility  suggest central involvement. No clinically significant nystagmus was observed during positional and positioning tests.Normal head shake test results do not suggest an imbalance of vestibular inputs. Normal bithermal calorics  do not suggest  an unilateral or bilateral weakness affecting the lateral semicircular canals and/or superior vestibular nerves in either ear.  6. Abnormal phase lead for [facility] suggests central involvement. Normal SVT suggests an intact velocity storage mechanism.       Recommendations: 1. Consider imaging to rule out possible retrocochlear site of lesion. 2. Referral to physical therapy for vestibular rehabilitation. 3. Referral to neurology. 4. Follow-up appointment with Dr. Allena Katz at [facility] for further discussion and recommendations.

## 2023-01-11 ENCOUNTER — Other Ambulatory Visit: Payer: Self-pay | Admitting: Internal Medicine

## 2023-01-11 ENCOUNTER — Ambulatory Visit (INDEPENDENT_AMBULATORY_CARE_PROVIDER_SITE_OTHER): Payer: Medicare Other

## 2023-01-11 DIAGNOSIS — D520 Dietary folate deficiency anemia: Secondary | ICD-10-CM

## 2023-01-11 LAB — CBC
Hematocrit: 42 % (ref 37.5–51.0)
Hemoglobin: 12.3 g/dL — ABNORMAL LOW (ref 13.0–17.7)
MCH: 18.9 pg — ABNORMAL LOW (ref 26.6–33.0)
MCHC: 29.3 g/dL — ABNORMAL LOW (ref 31.5–35.7)
MCV: 65 fL — ABNORMAL LOW (ref 79–97)
Platelets: 203 10*3/uL (ref 150–450)
RBC: 6.5 x10E6/uL — ABNORMAL HIGH (ref 4.14–5.80)
RDW: 18.1 % — ABNORMAL HIGH (ref 11.6–15.4)
WBC: 5.4 10*3/uL (ref 3.4–10.8)

## 2023-01-11 LAB — BASIC METABOLIC PANEL
BUN/Creatinine Ratio: 13 (ref 10–24)
BUN: 25 mg/dL (ref 8–27)
CO2: 26 mmol/L (ref 20–29)
Calcium: 9.3 mg/dL (ref 8.6–10.2)
Chloride: 104 mmol/L (ref 96–106)
Creatinine, Ser: 1.87 mg/dL — ABNORMAL HIGH (ref 0.76–1.27)
Glucose: 90 mg/dL (ref 70–99)
Potassium: 4.5 mmol/L (ref 3.5–5.2)
Sodium: 141 mmol/L (ref 134–144)
eGFR: 36 mL/min/{1.73_m2} — ABNORMAL LOW (ref >59)

## 2023-01-11 LAB — VITAMIN D 25 HYDROXY (VIT D DEFICIENCY, FRACTURES): Vit D, 25-Hydroxy: 74.2 ng/mL (ref 30.0–100.0)

## 2023-01-11 LAB — MAGNESIUM: Magnesium: 2.3 mg/dL (ref 1.6–2.3)

## 2023-01-24 ENCOUNTER — Other Ambulatory Visit: Payer: Self-pay | Admitting: Cardiology

## 2023-02-06 NOTE — Telephone Encounter (Signed)
closing

## 2023-05-09 ENCOUNTER — Ambulatory Visit (INDEPENDENT_AMBULATORY_CARE_PROVIDER_SITE_OTHER): Admitting: Internal Medicine

## 2023-05-09 ENCOUNTER — Encounter: Payer: Self-pay | Admitting: Internal Medicine

## 2023-05-09 ENCOUNTER — Telehealth: Payer: Self-pay

## 2023-05-09 ENCOUNTER — Ambulatory Visit (INDEPENDENT_AMBULATORY_CARE_PROVIDER_SITE_OTHER)

## 2023-05-09 VITALS — BP 140/100 | HR 51 | Temp 97.7°F | Ht 68.0 in | Wt 174.8 lb

## 2023-05-09 DIAGNOSIS — H538 Other visual disturbances: Secondary | ICD-10-CM

## 2023-05-09 DIAGNOSIS — E785 Hyperlipidemia, unspecified: Secondary | ICD-10-CM

## 2023-05-09 DIAGNOSIS — I10 Essential (primary) hypertension: Secondary | ICD-10-CM | POA: Diagnosis not present

## 2023-05-09 DIAGNOSIS — R202 Paresthesia of skin: Secondary | ICD-10-CM

## 2023-05-09 DIAGNOSIS — R739 Hyperglycemia, unspecified: Secondary | ICD-10-CM

## 2023-05-09 DIAGNOSIS — R29898 Other symptoms and signs involving the musculoskeletal system: Secondary | ICD-10-CM

## 2023-05-09 DIAGNOSIS — M545 Low back pain, unspecified: Secondary | ICD-10-CM

## 2023-05-09 DIAGNOSIS — N401 Enlarged prostate with lower urinary tract symptoms: Secondary | ICD-10-CM

## 2023-05-09 DIAGNOSIS — C642 Malignant neoplasm of left kidney, except renal pelvis: Secondary | ICD-10-CM

## 2023-05-09 DIAGNOSIS — R296 Repeated falls: Secondary | ICD-10-CM | POA: Insufficient documentation

## 2023-05-09 DIAGNOSIS — R42 Dizziness and giddiness: Secondary | ICD-10-CM

## 2023-05-09 DIAGNOSIS — E559 Vitamin D deficiency, unspecified: Secondary | ICD-10-CM

## 2023-05-09 DIAGNOSIS — M47816 Spondylosis without myelopathy or radiculopathy, lumbar region: Secondary | ICD-10-CM | POA: Diagnosis not present

## 2023-05-09 LAB — URINALYSIS
Bilirubin Urine: NEGATIVE
Ketones, ur: NEGATIVE
Leukocytes,Ua: NEGATIVE
Nitrite: NEGATIVE
Specific Gravity, Urine: <=1.005 — AB (ref 1.000–1.030)
Total Protein, Urine: NEGATIVE
Urine Glucose: NEGATIVE
Urobilinogen, UA: 0.2 (ref 0.0–1.0)
pH: 7 (ref 5.0–8.0)

## 2023-05-09 LAB — CBC WITH DIFFERENTIAL/PLATELET
Basophils Absolute: 0.1 10*3/uL (ref 0.0–0.1)
Basophils Relative: 1 % (ref 0.0–3.0)
Eosinophils Absolute: 0.1 10*3/uL (ref 0.0–0.7)
Eosinophils Relative: 1.2 % (ref 0.0–5.0)
HCT: 41.4 % (ref 39.0–52.0)
Hemoglobin: 12.9 g/dL — ABNORMAL LOW (ref 13.0–17.0)
Lymphocytes Relative: 17.8 % (ref 12.0–46.0)
Lymphs Abs: 1 10*3/uL (ref 0.7–4.0)
MCHC: 31 g/dL (ref 30.0–36.0)
MCV: 60.6 fl — ABNORMAL LOW (ref 78.0–100.0)
Monocytes Absolute: 0.4 10*3/uL (ref 0.1–1.0)
Monocytes Relative: 7.1 % (ref 3.0–12.0)
Neutro Abs: 3.9 10*3/uL (ref 1.4–7.7)
Neutrophils Relative %: 72.9 % (ref 43.0–77.0)
Platelets: 168 10*3/uL (ref 150.0–400.0)
RBC: 6.84 Mil/uL — ABNORMAL HIGH (ref 4.22–5.81)
RDW: 16.7 % — ABNORMAL HIGH (ref 11.5–15.5)
WBC: 5.4 10*3/uL (ref 4.0–10.5)

## 2023-05-09 LAB — COMPREHENSIVE METABOLIC PANEL WITH GFR
ALT: 9 U/L (ref 0–53)
AST: 13 U/L (ref 0–37)
Albumin: 4.7 g/dL (ref 3.5–5.2)
Alkaline Phosphatase: 67 U/L (ref 39–117)
BUN: 21 mg/dL (ref 6–23)
CO2: 31 meq/L (ref 19–32)
Calcium: 9.3 mg/dL (ref 8.4–10.5)
Chloride: 103 meq/L (ref 96–112)
Creatinine, Ser: 1.9 mg/dL — ABNORMAL HIGH (ref 0.40–1.50)
GFR: 33.1 mL/min — ABNORMAL LOW (ref >60.00)
Glucose, Bld: 92 mg/dL (ref 70–99)
Potassium: 4.5 meq/L (ref 3.5–5.1)
Sodium: 142 meq/L (ref 135–145)
Total Bilirubin: 1.4 mg/dL — ABNORMAL HIGH (ref 0.2–1.2)
Total Protein: 7.2 g/dL (ref 6.0–8.3)

## 2023-05-09 LAB — VITAMIN B12: Vitamin B-12: >1537 pg/mL — ABNORMAL HIGH (ref 211–911)

## 2023-05-09 LAB — HEMOGLOBIN A1C: Hgb A1c MFr Bld: 6 % (ref 4.6–6.5)

## 2023-05-09 LAB — LIPID PANEL
Cholesterol: 192 mg/dL (ref 0–200)
HDL: 53.2 mg/dL (ref >39.00)
LDL Cholesterol: 125 mg/dL — ABNORMAL HIGH (ref 0–99)
NonHDL: 139.09
Total CHOL/HDL Ratio: 4
Triglycerides: 69 mg/dL (ref 0.0–149.0)
VLDL: 13.8 mg/dL (ref 0.0–40.0)

## 2023-05-09 LAB — TESTOSTERONE: Testosterone: 508.65 ng/dL (ref 300.00–890.00)

## 2023-05-09 LAB — TSH: TSH: 1.56 u[IU]/mL (ref 0.35–5.50)

## 2023-05-09 LAB — PSA: PSA: 7.96 ng/mL — ABNORMAL HIGH (ref 0.10–4.00)

## 2023-05-09 LAB — CK: Total CK: 72 U/L (ref 7–232)

## 2023-05-09 LAB — AMMONIA: Ammonia: 22 umol/L (ref 11–35)

## 2023-05-09 LAB — VITAMIN D 25 HYDROXY (VIT D DEFICIENCY, FRACTURES): VITD: 107.67 ng/mL (ref 30.00–100.00)

## 2023-05-09 LAB — T4, FREE: Free T4: 0.9 ng/dL (ref 0.60–1.60)

## 2023-05-09 LAB — SEDIMENTATION RATE: Sed Rate: 14 mm/h (ref 0–20)

## 2023-05-09 LAB — CORTISOL: Cortisol, Plasma: 10.5 ug/dL

## 2023-05-09 MED ORDER — FLUTICASONE PROPIONATE 50 MCG/ACT NA SUSP: 2.0000 | Freq: Every day | NASAL | 0 refills | Status: DC

## 2023-05-09 MED ORDER — OLMESARTAN MEDOXOMIL 20 MG PO TABS: 20.0000 mg | ORAL_TABLET | Freq: Every day | ORAL | 3 refills | Status: AC

## 2023-05-09 MED ORDER — ZOLPIDEM TARTRATE 10 MG PO TABS: 10.0000 mg | ORAL_TABLET | Freq: Every day | ORAL | 1 refills | Status: DC

## 2023-05-09 NOTE — Assessment & Plan Note (Addendum)
 New Status post negative ENT evaluation Unclear etiology.  Broad differential diagnosis including degenerative/autoimmune CNS disease, CVA, space-occupying lesion etc. See orders Neurology consultation

## 2023-05-09 NOTE — Assessment & Plan Note (Addendum)
 R>>L - new R leg - weaker, DTRs decreased compare to the L Ataxic Romberg (-) No pronator drift Subtle weakness of the grip B A/o/c Muscles NT. No twitching  Unclear etiology.  Broad differential diagnosis including degenerative/autoimmune CNS disease, CVA, space-occupying lesion etc. LS spine x ray Labs w/CK, ESR and many others Brain MRI Neurology consult

## 2023-05-09 NOTE — Assessment & Plan Note (Addendum)
 New Unclear etiology.  Broad differential diagnosis including degenerative/autoimmune CNS disease, CVA, space-occupying lesion etc. See orders Neurology consultation

## 2023-05-09 NOTE — Assessment & Plan Note (Signed)
  Unclear etiology.  Broad differential diagnosis including degenerative/autoimmune CNS disease, CVA, space-occupying lesion etc. See orders

## 2023-05-09 NOTE — Progress Notes (Signed)
 Subjective:  Patient ID: Chad Palmer, male    DOB: 02/09/1945  Age: 78 y.o. MRN: [id number]  CC: Dizziness (Balance and lightheadedness since stent placement June 2024. Patient did collapse post placement and medication was adjusted. Still experiencing some residual balance issues (notes having trouble with moving right leg, feels that they are constantly falling forward, blurred vision at times). These symptoms more prevalent within the last 3 weeks. )   HPI [name] presents for Dizziness (Balance and lightheadedness since stent placement June 2024. Patient did collapse post placement and medication was adjusted.   C/o new balance issues x 1 month notes having trouble with moving right leg, feels that they are constantly falling forward, blurred vision at times. These symptoms more prevalent within the last 3 weeks.  C/o weak legs, losing muscle in the legs Pt stopped Lipitor in Nov 2024  Some HAs on the R side x 2 weeks  No back pain  Outpatient Medications Prior to Visit  Medication Sig Dispense Refill   aspirin EC (ASPIRIN LOW DOSE) 81 MG tablet TAKE 1 TABLET BY MOUTH DAILY SWALLOW WHOLE 90 tablet 3   Calcium-Vitamin D-Vitamin K (CALCIUM + D + K PO) Take 1 tablet by mouth daily.     clopidogrel (PLAVIX) 75 MG tablet TAKE 1 TABLET BY MOUTH DAILY 90 tablet 3   co-enzyme Q-10 30 MG capsule Take 30 mg by mouth daily.     Cyanocobalamin (B-12) 2500 MCG TABS Take 2,500 mcg by mouth daily.     finasteride (PROSCAR) 5 MG tablet Take 1 tablet (5 mg total) by mouth daily. 90 tablet 3   folic acid (FOLVITE) 1 MG tablet TAKE 1 TABLET BY MOUTH DAILY 90 tablet 1   ipratropium (ATROVENT) 0.03 % nasal spray Place 2 sprays into both nostrils 2 (two) times daily as needed for rhinitis. 30 mL 12   lidocaine (LIDODERM) 5 % Place 1 patch onto the skin daily as needed (pain). Remove & Discard patch within 12 hours or as directed by MD     nitroGLYCERIN (NITROSTAT) 0.4 MG SL tablet DISSOLVE 1  TABLET UNDER TONGUE FOR CHEST PAIN - IF PAIN REMAINS AFTER 5 MIN, CALL 911 AND REPEAT DOSE. MAX 3 TABLETS IN 15 MINUTES 25 tablet 0   Probiotic Product (ALIGN) 4 MG CAPS Take 4 mg by mouth at bedtime.     sildenafil (VIAGRA) 100 MG tablet Take 50 mg by mouth daily as needed for erectile dysfunction.     fluticasone (FLONASE) 50 MCG/ACT nasal spray USE 2 SPRAYS IN EACH NOSTRIL DAILY 16 mL 0   olmesartan (BENICAR) 20 MG tablet Take 0.5 tablets (10 mg total) by mouth daily. 45 tablet 3   zolpidem (AMBIEN) 10 MG tablet TAKE 1 TABLET BY MOUTH AT BEDTIME 90 tablet 1   No facility-administered medications prior to visit.    ROS: Review of Systems  Constitutional:  Positive for fatigue. Negative for appetite change and unexpected weight change.  HENT:  Positive for congestion. Negative for nosebleeds, sneezing, sore throat, trouble swallowing and voice change.   Eyes:  Positive for visual disturbance. Negative for photophobia, pain and itching.  Respiratory:  Negative for cough, shortness of breath and wheezing.   Cardiovascular:  Negative for chest pain, palpitations and leg swelling.  Gastrointestinal:  Negative for abdominal distention, blood in stool, diarrhea, nausea and vomiting.  Endocrine: Negative for cold intolerance.  Genitourinary:  Negative for dysuria, frequency and hematuria.  Musculoskeletal:  Positive for gait  problem. Negative for arthralgias, back pain, joint swelling, myalgias, neck pain and neck stiffness.  Skin:  Negative for rash.  Allergic/Immunologic: Positive for environmental allergies.  Neurological:  Positive for dizziness, weakness, light-headedness and headaches. Negative for tremors, seizures, syncope, facial asymmetry and speech difficulty.  Hematological:  Does not bruise/bleed easily.  Psychiatric/Behavioral:  Negative for agitation, behavioral problems, confusion, decreased concentration, dysphoric mood, sleep disturbance and suicidal ideas. The patient is not  nervous/anxious and is not hyperactive.     Objective:  BP (!) 140/100   Pulse (!) 51   Temp 97.7 F (36.5 C)   Ht 5\' 8"  (1.727 m)   Wt 174 lb 12.8 oz (79.3 kg)   SpO2 99%   BMI 26.58 kg/m   BP Readings from Last 3 Encounters:  05/09/23 (!) 140/100  11/21/22 134/80  11/13/22 124/60    Wt Readings from Last 3 Encounters:  05/09/23 174 lb 12.8 oz (79.3 kg)  11/30/22 169 lb (76.7 kg)  11/21/22 169 lb 12.8 oz (77 kg)    Physical Exam Constitutional:      General: He is not in acute distress.    Appearance: Normal appearance. He is well-developed.     Comments: NAD  HENT:     Right Ear: There is no impacted cerumen.     Left Ear: There is no impacted cerumen.     Nose: No congestion.  Eyes:     Conjunctiva/sclera: Conjunctivae normal.     Pupils: Pupils are equal, round, and reactive to light.  Neck:     Thyroid: No thyromegaly.     Vascular: No JVD.  Cardiovascular:     Rate and Rhythm: Normal rate and regular rhythm.     Heart sounds: Normal heart sounds. No murmur heard.    No friction rub. No gallop.  Pulmonary:     Effort: Pulmonary effort is normal. No respiratory distress.     Breath sounds: Normal breath sounds. No wheezing or rales.  Chest:     Chest wall: No tenderness.  Abdominal:     General: Bowel sounds are normal. There is no distension.     Palpations: Abdomen is soft. There is no mass.     Tenderness: There is no abdominal tenderness. There is no guarding or rebound.  Musculoskeletal:        General: No swelling or tenderness. Normal range of motion.     Cervical back: Normal range of motion.     Right lower leg: No edema.     Left lower leg: No edema.  Lymphadenopathy:     Cervical: No cervical adenopathy.  Skin:    General: Skin is warm and dry.     Findings: No rash.  Neurological:     Mental Status: He is alert and oriented to person, place, and time.     Cranial Nerves: No cranial nerve deficit.     Motor: Weakness present. No  abnormal muscle tone.     Coordination: Coordination abnormal.     Gait: Gait abnormal.     Deep Tendon Reflexes: Reflexes abnormal.  Psychiatric:        Behavior: Behavior normal.        Thought Content: Thought content normal.        Judgment: Judgment normal.   R leg - weaker, DTRs decreased compare to the L Ataxic Romberg (-) No pronator drift Subtle weakness of the grip B A/o/c Muscles NT. No twitching  Lab Results  Component Value Date  WBC 5.4 05/09/2023   HGB 12.9 (L) 05/09/2023   HCT 41.4 05/09/2023   PLT 168.0 05/09/2023   GLUCOSE 92 05/09/2023   CHOL 192 05/09/2023   TRIG 69.0 05/09/2023   HDL 53.20 05/09/2023   LDLCALC 125 (H) 05/09/2023   ALT 9 05/09/2023   AST 13 05/09/2023   NA 142 05/09/2023   K 4.5 05/09/2023   CL 103 05/09/2023   CREATININE 1.90 (H) 05/09/2023   BUN 21 05/09/2023   CO2 31 05/09/2023   TSH 1.56 05/09/2023   PSA 7.96 (H) 05/09/2023   HGBA1C 6.0 05/09/2023    MR MRV HEAD WO CONTRAST Result Date: 10/18/2022 CLINICAL DATA:  Vertigo, central. Headache, posttraumatic. Left arm numbness and tingling. EXAM: MR VENOGRAM HEAD WITHOUT CONTRAST TECHNIQUE: Angiographic images of the intracranial venous structures were acquired using MRV technique without intravenous contrast. COMPARISON:  None Available. FINDINGS: The superior sagittal sinus, internal cerebral veins, vein of Galen, straight sinus, transverse sinuses, sigmoid sinuses, and jugular bulbs are patent without evidence of thrombus or significant stenosis. The right transverse and sigmoid sinuses are dominant. IMPRESSION: Negative head MRV. Electronically Signed   By: Sebastian Ache M.D.   On: 10/18/2022 14:48    Assessment & Plan:   Problem List Items Addressed This Visit     Essential hypertension   Relevant Medications   olmesartan (BENICAR) 20 MG tablet   BPH (benign prostatic hyperplasia)   Relevant Orders   PSA (Completed)   Low back pain   Renal cancer, left (HCC)    Hyperglycemia   Relevant Orders   Hemoglobin A1c (Completed)   Falls - Primary    Unclear etiology.  Broad differential diagnosis including degenerative/autoimmune CNS disease, CVA, space-occupying lesion etc. See orders      Relevant Orders   MR Brain W Wo Contrast   Ambulatory referral to Neurology   Leg weakness   R>>L - new R leg - weaker, DTRs decreased compare to the L Ataxic Romberg (-) No pronator drift Subtle weakness of the grip B A/o/c Muscles NT. No twitching  Unclear etiology.  Broad differential diagnosis including degenerative/autoimmune CNS disease, CVA, space-occupying lesion etc. LS spine x ray Labs w/CK, ESR and many others Brain MRI Neurology consult      Relevant Orders   Ammonia (Completed)   CBC with Differential/Platelet (Completed)   Comprehensive metabolic panel with GFR (Completed)   Cortisol (Completed)   Hemoglobin A1c (Completed)   Lipid panel (Completed)   T4, free (Completed)   TSH (Completed)   CK (Completed)   Urinalysis (Completed)   PSA (Completed)   Sedimentation rate (Completed)   Testosterone (Completed)   Vitamin B12 (Completed)   VITAMIN D 25 Hydroxy (Vit-D Deficiency, Fractures) (Completed)   MR Brain W Wo Contrast   DG Lumbar Spine 2-3 Views (Completed)   Ambulatory referral to Neurology   Blurred vision, bilateral   New Unclear etiology.  Broad differential diagnosis including degenerative/autoimmune CNS disease, CVA, space-occupying lesion etc. See orders Neurology consultation      Relevant Orders   Ammonia (Completed)   CBC with Differential/Platelet (Completed)   Comprehensive metabolic panel with GFR (Completed)   Cortisol (Completed)   Hemoglobin A1c (Completed)   Lipid panel (Completed)   T4, free (Completed)   TSH (Completed)   CK (Completed)   Urinalysis (Completed)   PSA (Completed)   Sedimentation rate (Completed)   Testosterone (Completed)   Vitamin B12 (Completed)   VITAMIN D 25 Hydroxy  (Vit-D Deficiency, Fractures) (Completed)  MR Brain W Wo Contrast   DG Lumbar Spine 2-3 Views (Completed)   Ambulatory referral to Neurology   Dizzinesses   New Status post negative ENT evaluation Unclear etiology.  Broad differential diagnosis including degenerative/autoimmune CNS disease, CVA, space-occupying lesion etc. See orders Neurology consultation      Relevant Orders   Ammonia (Completed)   CBC with Differential/Platelet (Completed)   Comprehensive metabolic panel with GFR (Completed)   Cortisol (Completed)   Hemoglobin A1c (Completed)   Lipid panel (Completed)   T4, free (Completed)   TSH (Completed)   CK (Completed)   Urinalysis (Completed)   PSA (Completed)   Sedimentation rate (Completed)   Testosterone (Completed)   Vitamin B12 (Completed)   VITAMIN D 25 Hydroxy (Vit-D Deficiency, Fractures) (Completed)   MR Brain W Wo Contrast   DG Lumbar Spine 2-3 Views (Completed)   Ambulatory referral to Neurology   Other Visit Diagnoses       Paresthesia       Relevant Orders   Vitamin B12 (Completed)   Ambulatory referral to Neurology     Dyslipidemia       Relevant Orders   Lipid panel (Completed)   T4, free (Completed)   TSH (Completed)     Vitamin D deficiency       Relevant Orders   VITAMIN D 25 Hydroxy (Vit-D Deficiency, Fractures) (Completed)         Meds ordered this encounter  Medications   olmesartan (BENICAR) 20 MG tablet    Sig: Take 1 tablet (20 mg total) by mouth daily.    Dispense:  90 tablet    Refill:  3   zolpidem (AMBIEN) 10 MG tablet    Sig: Take 1 tablet (10 mg total) by mouth at bedtime.    Dispense:  90 tablet    Refill:  1   fluticasone (FLONASE) 50 MCG/ACT nasal spray    Sig: Place 2 sprays into both nostrils daily.    Dispense:  16 mL    Refill:  0      Follow-up: Return in about 6 weeks (around 06/20/2023) for a follow-up visit.  Sonda Primes, MD

## 2023-05-09 NOTE — Telephone Encounter (Signed)
 CRITICAL VALUE STICKER  CRITICAL VALUE: Vit. D - 107.67  RECEIVER (on-site recipient of call): K.Sloka Volante  DATE & TIME NOTIFIED: 05/09/2023 @ 1243  MESSENGER (representative from lab): Henderson Cloud  MD NOTIFIED: Plotnikov

## 2023-05-10 ENCOUNTER — Encounter: Payer: Self-pay | Admitting: Neurology

## 2023-05-10 ENCOUNTER — Encounter: Payer: Self-pay | Admitting: Internal Medicine

## 2023-05-10 NOTE — Telephone Encounter (Signed)
 Addressed.  He needs to stop vitamin D.  Thanks

## 2023-05-11 ENCOUNTER — Ambulatory Visit
Admission: RE | Admit: 2023-05-11 | Discharge: 2023-05-11 | Disposition: A | Discharge status: Home / Self Care | Source: Ambulatory Visit | Attending: Internal Medicine | Admitting: Internal Medicine

## 2023-05-11 DIAGNOSIS — R29898 Other symptoms and signs involving the musculoskeletal system: Secondary | ICD-10-CM

## 2023-05-11 DIAGNOSIS — R42 Dizziness and giddiness: Secondary | ICD-10-CM

## 2023-05-11 DIAGNOSIS — H538 Other visual disturbances: Secondary | ICD-10-CM

## 2023-05-11 DIAGNOSIS — R296 Repeated falls: Secondary | ICD-10-CM | POA: Diagnosis not present

## 2023-05-11 DIAGNOSIS — R270 Ataxia, unspecified: Secondary | ICD-10-CM | POA: Diagnosis not present

## 2023-05-11 MED ORDER — GADOPICLENOL 0.5 MMOL/ML IV SOLN
10.0000 mL | Freq: Once | INTRAVENOUS | Status: AC | PRN
  Administered 2023-05-11: 10 mL via INTRAVENOUS

## 2023-05-14 ENCOUNTER — Encounter: Payer: Self-pay | Admitting: Internal Medicine

## 2023-05-17 ENCOUNTER — Encounter: Payer: Self-pay | Admitting: Internal Medicine

## 2023-05-17 DIAGNOSIS — R079 Chest pain, unspecified: Secondary | ICD-10-CM

## 2023-05-17 DIAGNOSIS — I10 Essential (primary) hypertension: Secondary | ICD-10-CM

## 2023-05-17 DIAGNOSIS — I2510 Atherosclerotic heart disease of native coronary artery without angina pectoris: Secondary | ICD-10-CM

## 2023-05-18 MED ORDER — ROSUVASTATIN CALCIUM 10 MG PO TABS: 10.0000 mg | ORAL_TABLET | Freq: Every day | ORAL | 3 refills | Status: DC

## 2023-05-18 NOTE — Telephone Encounter (Signed)
  LDL should really be lower given that he has CAD  LEss than 70  When I saw the pt last he was having cramps in legs  Told him to hold lipitor Did cramping get better?   If it did , I would still recomm trying another medicine to lower cholesterol given that he has CAD Crestor is chemically different from lipitor    I would try 10 mg    See how he feels   Check lipomed panel in 8 wk with liver panel and CK

## 2023-06-04 NOTE — Progress Notes (Unsigned)
 Initial neurology clinic note  Reason for Evaluation: Consultation requested by Plotnikov, Oakley Bellman, MD for an opinion regarding syncope, imbalance, weakness. My final recommendations will be communicated back to the requesting physician by way of shared medical record or letter to requesting physician via [place]  mail.  HPI: This is Mr. Chad Palmer, a 78 y.o. right-handed male with a medical history of HTN, CAD, HLD, kidney cancer, cataracts, diverticulosis, BPH who presents to neurology clinic with the chief complaint of syncope, imbalance, weakness. The patient is accompanied by wife, Chad Palmer.  Patient had to 2 coronary stents in 07/2022. He was doing well until 08/2022 when he passed out. He was bent over washing his face after shaving and stood up and passed out. He was unconscious for about 2 minutes. He was incoherent for about 1 hour (while he was in the ER). Per patient, his SBP was in the 80s per EMS. No problems were found at the time. His hydrochlorothiazide  was changed by cardiology with some improvement in the lightheadedness.   In 04/2023 patient's imbalance got worse. He was walking out of restaurant and had 2 days of unsteadiness. He has blurred vision as well. He balance has become very poor. He had ENT and vestibular testing. Per patient he was told he had an inner ear issue, but per testing I can see, ENT seems to think symptoms are central and not peripheral.  He has good and bad days. He has symptoms a couple of days per week. He denies room spinning. He feels like he is rocking side to side. He feels like he wants to fall forward though. If he leans forward too far, he will feel like he is going to fall. He has blurred vision at the same time, like there is a filter over his eyes. The symptoms do not significantly fluctuate on bad days. He has days that he is totally normal though. He denies any falls because he is very careful.   He feels like the legs are weak all the time. His  legs feel heavy. He denies any numbness or tingling. He does have achiness in the legs. He endorses occasional cramps. He also has proximal muscle weakness in the arms (not so much in the hands). He has pain in the left arm and shoulder similar to prior episodes of CAD.  He was previously on Lipitor and this was stopped in 12/2022. He started Crestor  a couple of weeks ago. He does not think either change has changed the symptoms above.  He smells well. He denies tremor. He denies freezing or being unable to initiate movement. There is no concerns for REM sleep disorder.   There is no concern for pseudobulbar affect.  He does not report any constitutional symptoms like fever, night sweats, anorexia or unintentional weight loss. He has lost about 4 lbs intentional in the last few weeks.  He had MRI brain on 05/11/23 showed no acute process with mild atrophy and moderate diffuse chronic microvascular ischemia.  He is on asa and plavix  for CAD.  He has a history of left kidney cancer about 3 years. He had surgical resection with no chemo or radiation. He follows urology once a year for follow up for this.  EtOH use: No  Restrictive diet? No Family history of neuropathy/myopathy/NM disease? Mother and brother died of Alzheimer's, so this is the most concerning thing for patient  MEDICATIONS:  Outpatient Encounter Medications as of 06/06/2023  Medication Sig   aspirin  EC (  ASPIRIN  LOW DOSE) 81 MG tablet TAKE 1 TABLET BY MOUTH DAILY SWALLOW WHOLE   Calcium -Vitamin D -Vitamin K (CALCIUM  + D + K PO) Take 1 tablet by mouth daily.   clopidogrel  (PLAVIX ) 75 MG tablet TAKE 1 TABLET BY MOUTH DAILY   co-enzyme Q-10 30 MG capsule Take 30 mg by mouth daily.   Cyanocobalamin  (B-12) 2500 MCG TABS Take 2,500 mcg by mouth daily.   finasteride  (PROSCAR ) 5 MG tablet Take 1 tablet (5 mg total) by mouth daily.   fluticasone  (FLONASE ) 50 MCG/ACT nasal spray Place 2 sprays into both nostrils daily.   folic acid   (FOLVITE ) 1 MG tablet TAKE 1 TABLET BY MOUTH DAILY   ipratropium (ATROVENT ) 0.03 % nasal spray Place 2 sprays into both nostrils 2 (two) times daily as needed for rhinitis.   lidocaine  (LIDODERM ) 5 % Place 1 patch onto the skin daily as needed (pain). Remove & Discard patch within 12 hours or as directed by MD   nitroGLYCERIN  (NITROSTAT ) 0.4 MG SL tablet DISSOLVE 1 TABLET UNDER TONGUE FOR CHEST PAIN - IF PAIN REMAINS AFTER 5 MIN, CALL 911 AND REPEAT DOSE. MAX 3 TABLETS IN 15 MINUTES   olmesartan  (BENICAR ) 20 MG tablet Take 1 tablet (20 mg total) by mouth daily.   rosuvastatin  (CRESTOR ) 10 MG tablet Take 1 tablet (10 mg total) by mouth daily.   sildenafil (VIAGRA) 100 MG tablet Take 50 mg by mouth daily as needed for erectile dysfunction.   zolpidem  (AMBIEN ) 10 MG tablet Take 1 tablet (10 mg total) by mouth at bedtime.   Probiotic Product (ALIGN) 4 MG CAPS Take 4 mg by mouth at bedtime. (Patient not taking: Reported on 06/06/2023)   No facility-administered encounter medications on file as of 06/06/2023.    PAST MEDICAL HISTORY: Past Medical History:  Diagnosis Date   Allergic rhinitis    Arthritis    BPH (benign prostatic hyperplasia)    Cancer (HCC)    kidney    Cataract    bilateral    Cervical spinal stenosis    Dr Dohmier   Colon polyp    Diverticulosis of colon    ED (erectile dysfunction)    Elevated PSA    GERD (gastroesophageal reflux disease)    Hemorrhoids    HTN (hypertension)    Microcytosis     PAST SURGICAL HISTORY: Past Surgical History:  Procedure Laterality Date   cataract surg     BIL   CORONARY CTO INTERVENTION N/A 08/01/2022   Procedure: CORONARY CTO INTERVENTION;  Surgeon: Kyra Phy, MD;  Location: [facility];  Service: Cardiovascular;  Laterality: N/A;   CORONARY ULTRASOUND/IVUS N/A 08/01/2022   Procedure: Coronary Ultrasound/IVUS;  Surgeon: Kyra Phy, MD;  Location: [facility];  Service: Cardiovascular;  Laterality: N/A;    HEMORRHOID SURGERY     LAPAROSCOPIC NEPHRECTOMY Left 12/22/2019   Procedure: LAPAROSCOPIC RADICAL NEPHRECTOMY;  Surgeon: Florencio Hunting, MD;  Location: [facility];  Service: Urology;  Laterality: Left;   LEFT HEART CATH AND CORONARY ANGIOGRAPHY N/A 08/01/2022   Procedure: LEFT HEART CATH AND CORONARY ANGIOGRAPHY;  Surgeon: Kyra Phy, MD;  Location: [facility];  Service: Cardiovascular;  Laterality: N/A;   TOENAIL EXCISION  2009   ALL   TONSILLECTOMY     VASECTOMY     WISDOM TOOTH EXTRACTION      ALLERGIES: Allergies  Allergen Reactions   Levofloxacin Nausea Only   Lipitor [Atorvastatin ]     pains   Omeprazole   Chest pain   Rapaflo [Silodosin]     Dizzy    Bactrim  [Sulfamethoxazole -Trimethoprim ] Rash    FAMILY HISTORY: Family History  Problem Relation Age of Onset   Alzheimer's disease Mother    Heart disease Father    Alzheimer's disease Brother    Hypertension Other    Colon cancer Neg Hx    Esophageal cancer Neg Hx    Stomach cancer Neg Hx    Rectal cancer Neg Hx     SOCIAL HISTORY: Social History   Tobacco Use   Smoking status: Never   Smokeless tobacco: Never  Vaping Use   Vaping status: Never Used  Substance Use Topics   Alcohol use: No    Alcohol/week: 0.0 standard drinks of alcohol   Drug use: No   Social History   Social History Narrative      Are you right handed or left handed? right   Are you currently employed ?    What is your current occupation?retired   Do you live at home alone?   Who lives with you? Wife    What type of home do you live in: 1 story or 2 story? two    Caffiene 1-2 cups in am     OBJECTIVE: PHYSICAL EXAM: Ht 5\' 8"  (1.727 m)   Wt 173 lb (78.5 kg)   SpO2 96%   BMI 26.30 kg/m   Orthostatic BP 164/84 118/80 134/86  BP Location Left Arm Left Arm Left Arm  Patient Position Supine Sitting Standing  Cuff Size Normal Normal Normal  Orthostatic Pulse 88 96 94  Weight 173 lb (78.5 kg) -- --  Height 5\' 8"   (1.727 m) -- --  SpO2 96 % 96 % 96 %    General: General appearance: Awake and alert. No distress. Cooperative with exam.  Skin: No obvious rash or jaundice. HEENT: Atraumatic. Anicteric. Lungs: Non-labored breathing on room air  Heart: Regular Extremities: No edema. No obvious deformity.  Psych: Affect appropriate.  Neurological: Mental Status: Alert. Speech fluent. No pseudobulbar affect Cranial Nerves: CNII: No RAPD. Visual fields grossly intact. CNIII, IV, VI: PERRL. No nystagmus. EOMI. No diplopia with sustained upgaze. CN V: Facial sensation intact bilaterally to fine touch. CN VII: Facial muscles symmetric and strong. No ptosis at rest or after sustained upgaze. CN VIII: Hearing grossly intact bilaterally. CN IX: No hypophonia. CN X: Palate elevates symmetrically. CN XI: Full strength shoulder shrug bilaterally. CN XII: Tongue protrusion full and midline. No atrophy or fasciculations. No significant dysarthria Motor: Tone is normal. No atrophy.  Individual muscle group testing (MRC grade out of 5):  Movement     Neck flexion 5    Neck extension 5     Right Left   Shoulder abduction 5 5   Shoulder adduction 5 5   Shoulder ext rotation 5 5   Shoulder int rotation 5 5   Elbow flexion 5 5   Elbow extension 5 5   Wrist extension 5 5   Wrist flexion 5 5   Finger abduction - FDI 5 5   Finger abduction - ADM 5 5   Finger extension 5 5   Finger distal flexion - 2/3 5 5    Finger distal flexion - 4/5 5 5    Thumb flexion - FPL 5 5   Thumb abduction - APB 5 5    Hip flexion 5 5   Hip extension 5 5   Hip adduction 5 5   Hip abduction 5 5  Knee extension 5 5   Knee flexion 5 5   Dorsiflexion 5 5   Plantarflexion 5 5    Reflexes:  Right Left   Bicep 2+ 2+   Tricep 2+ 2+   BrRad 2+ 2+   Knee 2+ 2+   Ankle 2+ 2+    Pathological Reflexes: Babinski: mute response bilaterally Hoffman: absent bilaterally Troemner: absent bilaterally Sensation: Pinprick:  Intact in all extremities Vibration: intact in all extremities Coordination: Intact finger-to- nose-finger bilaterally. Romberg negative. Gait: Able to rise from chair with arms crossed unassisted. Stooped posture. Narrow-based gait. Mildly reduced arm swing in RUE. No clear freezing or en bloc turns. No shuffling.  Lab and Test Review: Internal labs: 05/09/23: HbA1c: 6.0 TSH wnl Lipid panel: tChol 192, LDL 125, TG 69.0 CK 72 ESR wnl B12: 1537 Vit D: 107.67 CMP significant for Cr 1.90, tbili 1.4 CBC w/ diff significant for MCV of 60.6  Imaging/Procedures: Cervical spine xray (05/16/22): FINDINGS: No fracture, bone lesion or spondylolisthesis.   Large anterior bridging osteophytes from C2 through C7. Disc spaces are well preserved.   Normal alignment.   Mild neural foraminal narrowing on the right at C4-C5 due to facet and uncovertebral spurring. Remaining neural foramina are relatively well preserved.   Soft tissues are unremarkable   IMPRESSION: 1. No fracture or malalignment. 2. Degenerative changes as detailed.  MRV head (10/02/22): FINDINGS: The superior sagittal sinus, internal cerebral veins, vein of Galen, straight sinus, transverse sinuses, sigmoid sinuses, and jugular bulbs are patent without evidence of thrombus or significant stenosis. The right transverse and sigmoid sinuses are dominant.   IMPRESSION: Negative head MRV.  Vestibular testing (12/27/22): Conclusions: Abnormal vestibular assessment with essentially central indicators.   1. Normal Gans SOP Test results suggest a balance between visual, somatosensory, and vestibular inputs for dynamic postural control.  2. Normal ABR results in the right ear suggest good function of the cochlear nerve. Normal ABR results in the right ear suggest good function of the cochlear nerve. Of note, poor morphology and replicability in the left ear with that fast rate may be a result of hearing loss, or possible  retrocochlear lesion. 3. Inconclusive results for ECochG in both ears. Cannot rule-out elevated labyrinthine pressure or endolymphatic hydrops in either ear. 4. Normal cVEMP results in both ears suggest intact function of the saccules and inferior vestibular nerves. 5. Abnormal ocular motility suggest central involvement. No clinically significant nystagmus was observed during positional and positioning tests.Normal head shake test results do not suggest an imbalance of vestibular inputs. Normal bithermal calorics  do not suggest  an unilateral or bilateral weakness affecting the lateral semicircular canals and/or superior vestibular nerves in either ear.  6. Abnormal phase lead for [facility] suggests central involvement. Normal SVT suggests an intact velocity storage mechanism.  Recommendations: 1. Consider imaging to rule out possible retrocochlear site of lesion. 2. Referral to physical therapy for vestibular rehabilitation. 3. Referral to neurology. 4. Follow-up appointment with Dr. Lydia Sams at [facility] for further discussion and recommendations.  Lumbar spine xray (05/09/23): FINDINGS: No evidence for an acute fracture. No subluxation. Loss of disc height noted L4-5 and L5-S1. Prominent anterior osteophytes noted from L2-3 down to L5-S1, mildly progressive in the interval. SI joints unremarkable.   IMPRESSION: Progressive degenerative changes in the lumbar spine. No acute bony findings.  MRI brain w/wo contrast (05/11/23): IMPRESSION: 1. No acute intracranial abnormality or evidence for metastatic disease. 2. Mild atrophy and moderate diffuse white matter disease. This likely  reflects the sequela of chronic microvascular ischemia.  ASSESSMENT: Chad Palmer is a 78 y.o. male who presents for evaluation of syncope, imbalance, and subjective weakness. He has a relevant medical history of HTN, CAD, HLD, kidney cancer, cataracts, diverticulosis, BPH. His neurological examination is  notable for mild hyperflexia but intact strength and sensation. Available diagnostic data is significant for normal CK, TSH, B12, ESR. HbA1c was 6.0. MRI brain showed mild atrophy and chronic microvascular ischemia that is not surprising given his known vascular risk factors. His vitals today were consistent with orthostatic hypotension. There is no clear neurologic disorder such as parkinsonism and normal strength and CK argues against myopathy. I will check for MG in case muscle weakness fluctuates, though patient does not report this, nor do I see signs of this on exam today. There is also no evidence of a peripheral neuropathy. He does have a history of neck pain and degenerative changes in the cervical spine, so cervical stenosis is a concern that could cause imbalance. The episode of syncope seems related to BP.  PLAN: -Blood work: MG panel -MRI cervical spine wo contrast -Orthostatic hypotension recommendations given -Recommended patient discuss BP and orthostatic hypotension with PCP or cardiology  -Return to clinic to be determined  The impression above as well as the plan as outlined below were extensively discussed with the patient who voiced understanding. All questions were answered to their satisfaction.  The patient was counseled on pertinent fall precautions per the printed material provided today, and as noted under the "Patient Instructions" section below.  When available, results of the above investigations and possible further recommendations will be communicated to the patient via telephone/MyChart. Patient to call office if not contacted after expected testing turnaround time.   Total time spent reviewing records, interview, history/exam, documentation, and coordination of care on day of encounter:  70 min   Thank you for allowing me to participate in patient's care.  If I can answer any additional questions, I would be pleased to do so.  Rommie Coats,  MD   CC: Plotnikov, Oakley Bellman, MD [address] [place] [address]  CC: Referring provider: Plotnikov, Oakley Bellman, MD [address] [place],  [place] [address]

## 2023-06-06 ENCOUNTER — Other Ambulatory Visit

## 2023-06-06 ENCOUNTER — Encounter: Payer: Self-pay | Admitting: Neurology

## 2023-06-06 ENCOUNTER — Ambulatory Visit (INDEPENDENT_AMBULATORY_CARE_PROVIDER_SITE_OTHER): Admitting: Neurology

## 2023-06-06 VITALS — Ht 68.0 in | Wt 173.0 lb

## 2023-06-06 DIAGNOSIS — R2689 Other abnormalities of gait and mobility: Secondary | ICD-10-CM | POA: Diagnosis not present

## 2023-06-06 DIAGNOSIS — M6281 Muscle weakness (generalized): Secondary | ICD-10-CM

## 2023-06-06 DIAGNOSIS — I951 Orthostatic hypotension: Secondary | ICD-10-CM

## 2023-06-06 DIAGNOSIS — M4802 Spinal stenosis, cervical region: Secondary | ICD-10-CM | POA: Diagnosis not present

## 2023-06-06 NOTE — Patient Instructions (Signed)
 I saw you today for imbalance and feeling of weakness. Luckily your examination today is normal. I found that your blood pressure drops when you sit or stand though, called orthostatic hypotension. This could be causing your lightheadedness or imbalance.  To be sure there is not another explanation neurologically, I will do the following: -Blood work today -MRI of cervical spine  I will be in touch when I have your results.  I would speak with your PCP and cardiology about blood pressure and make sure there are no changes that need to be made.  Please let me know if you have any questions or concerns in the meantime.   The physicians and staff at [facility] are committed to providing excellent care. You may receive a survey requesting feedback about your experience at our office. We strive to receive "very good" responses to the survey questions. If you feel that your experience would prevent you from giving the office a "very good " response, please contact our office to try to remedy the situation. We may be reached at [phone]. Thank you for taking the time out of your busy day to complete the survey.  Chad Coats, MD [facility]    Guidelines for the Nonpharmacological Treatment of Orthostatic Hypotension/ Orthostatic Tachycardia/ Orthostatic Intolerance  Make all postural changes from lying to sitting or sitting to standing, slowly.  Drink 2.0-2.5 L of fluid per day (if okay with your other doctors).  Increase sodium in the diet to 3-5 g per day (if okay with your other doctors).  Avoid large meals which can cause low blood pressure during digestion. It is better to eat smaller meals more often than 3 large meals.  Avoid alcohol. Alcohol can cause blood to pool in the legs which may worsen low blood pressure reactions when standing.  Perform lower extremity exercises to improve strength of the leg muscles. This will help prevent blood from pooling in the legs when  standing and walking.  Raise the head of the bed by 6-10 inches. The entire bed must be at an angle. Raising only the head portion of the bed at the waist level or using pillows will not be effective. Raising the head of the bed will reduce urine formation overnight and there will be more volume in the circulation in the morning. It may also help orthostatic tolerance during the day.  During bad days or prior to engaging in more physical activity than usual, drink 500 ml of water quickly. This will result in increased blood pressure within 5 minutes of drinking the water. The effect will last up to a few hours and may improve orthostatic intolerance.  Use custom-fitted elastic support stockings. This will reduce a tendency for blood to pool in the legs when standing and may improve orthostatic intolerance. Abdominal binder or "SPANX" may also be useful.  Use physical counter-maneuvers such as leg crossing, squatting, or raising and resting the leg on a chair. These maneuvers increase blood pressure and can improve orthostatic intolerance.  Gently escalated aerobic physical activity program for graded reconditioning (see details below)  Home-based Exercise Plan for Patients with Orthostatic Intolerance (as may be seen in POTS and related disorders)  Level 1 - Reclined Gentle Movements This is the starting point for those patients most severely disabled by an autonomic disorder. If you are bedridden, this is your first step. We have known dysautonomia patients who were bedridden for years, and we able to work their way from "barely able to  move" to biking 45 minutes a day, everyday. It will be hard. It may make you feel worse in the beginning. But the human body was meant to move. Just take baby steps until you can make it to your goal. Some patients can only do one minute a day, or one minute at a time, a few times a day. And they do this every day for a week, and then they increase to two minutes a  day the next week. This is a very slow process, but improving your health slowly is better than not improving it at all. Helpful exercises may include:  Leg Pillow Squeeze - while laying down or reclined in bed, put a pillow folded between your knees and squeeze. Hold it for 10 seconds. Repeat.  Arm Pillow Squeeze - put the pillow folded between your palms and squeeze together as though you were putting your hands into a praying position. Hold it for 10 seconds. Repeat.  Alphabet Toes - while laying in bed, write your name in the air with your toes. If you can build up your strength, write the whole alphabet. Do this several times a day.  Side Leg Lifts - while laying on your side, lift your leg up sideways and then bring your leg back down, without touching your legs together. Repeat.  Front Leg Lifts - While laying on your back, life your left leg up, pointing your toe towards the ceiling. Repeat. Switch to right leg.  Gentle Stretching - any kind of stretching helps move blood around in the body and takes stress of your joints if you have been sitting or laying in the same position for a long time. Go through the entire body doing mild stretches, from feet, to legs, to back, to arms, to neck. Doing this when you wake up can be a great way to start the day, and repeating your stretches before bed can help you relax and sleep better.  Level 2 - Recumbent Cardio Exercises This is probably the level that most patients will be able to begin with, although everyone can benefit from the gentle stretching and toning exercises described in Level 1.  Always begin your workout with 5-10 minutes of stretching and/or yoga to warm up your muscles and protect your joints from injury.  Since the point of these exercises is to get your cardiovascular system to be more efficient, you will want to set a target heart rate for your workout. You should speak to your doctor about this because medications and other  medical conditions can impact your target heart rate, but most patients can tolerate a workout at 75% to 80% of their maximum heart rate. [facility] has a Target Heart Rate Calculator you can use as a guide when speaking with your doctor.  You may want to purchase an exercise heart rate monitor to wear during your workouts to help you keep your heart rate within your target zone. We do not endorse any specific products, but exercise heart rate monitors with chest straps are usually more accurate than pulse oximeters you place on your finger. This is especially so for dysautonomia patients who have abnormalities in peripheral blood flow (common in some forms of dysautonomia), as this is more likely to give an inaccurate reading using a finger based monitor.  Suggested reclined cardiovascular exercises include:  Rowing - use a rowing machine, or if you are feeling well enough, a kayak. You may want to start out slow, maybe 2-5 minutes  a day. At your own pace, adding a few minutes per week, try to work your way up to 45 minutes per day, 5 days a week, with 30 minutes of your routine done in your target heart rate zone. Be sure to warm up at the beginning and cool down at the end.  Recumbent Biking - recumbent exercise bikes are different than regular exercise bikes. They seat the rider in a reclined position, rather than upright. Try recumbent biking a few minutes a day, adding a few minutes each week, until you can work out 45 minutes a day, five days a week, with 30 minutes of that workout in your target heart rate zone. Be sure to warm up at the beginning and cool down at the end of each workout.  Swimming - The pressure from water helps prevent orthostatic symptoms. Dysautonomia patients who have been bedridden for years may be able to stand upright for an hour in a pool, because the pressure from the water prevents orthostatic symptoms from occurring, or lessens their impact. Dysautonomia patients can  take advantage of this to get a good cardio workout, or to focus on stretching and strength training in the water. Always swim with a spotter or a buddy who can keep and eye on you, just in case you develop lightheadedness or other symptoms that would make it unsafe to be in a pool. It may be best to start your swimming exercise program at a pool with a lifeguard, or with a Physical Therapist who specializes in aquatic therapy. A good old fashioned kick board can be a great tool for dysautonomia patients. You can kick your way around the pool, which gives you a good cardio workout, and all that kicking helps strengthen your legs. Toning up your legs and core is a great way to minimize orthostatic symptoms.  Weight Training - Most dysautonomia patients can benefit from overall increase in tone and strength. The stronger our muscles are, the more efficiently they use oxygen, the better we will be able to tolerate orthostatic stress. Special emphasis can be placed on strengthening leg and core muscles. Some patients find it useful to wear 2 to 4 lb. weights that attach to the ankles with velcro. This can really help with leg strength if you wear them often enough. There are also machines at the gym that assist with core and leg weight training. Begin with light weights, and use them in a reclined or seated position. Some patients become extra symptomatic when lifting their arms over their head, so take extra care if attempting to do that.  Level 3 - Normal Workouts Some dysautonomia patients are able to jog, run marathons or walk several miles a week. These patients should do whatever they can to continue these activities. Dysautonomia patients who are well-conditioned should exercise 45 minutes a day, at least 3 days per week. Special emphasis should be placed on leg and core strength, and cardiovascular exercises.

## 2023-06-14 ENCOUNTER — Encounter: Payer: Self-pay | Admitting: Neurology

## 2023-06-14 LAB — MYASTHENIA GRAVIS PANEL 2
A CHR BINDING ABS: <0.3 nmol/L
ACHR Blocking Abs: <15 %{inhibition} (ref <15)
Acetylchol Modul Ab: 20 %{inhibition}

## 2023-06-20 ENCOUNTER — Ambulatory Visit
Admission: RE | Admit: 2023-06-20 | Discharge: 2023-06-20 | Disposition: A | Discharge status: Home / Self Care | Source: Ambulatory Visit | Attending: Neurology

## 2023-06-20 DIAGNOSIS — M6281 Muscle weakness (generalized): Secondary | ICD-10-CM

## 2023-06-20 DIAGNOSIS — R2689 Other abnormalities of gait and mobility: Secondary | ICD-10-CM

## 2023-06-20 DIAGNOSIS — H16223 Keratoconjunctivitis sicca, not specified as Sjogren's, bilateral: Secondary | ICD-10-CM | POA: Diagnosis not present

## 2023-06-20 DIAGNOSIS — M4802 Spinal stenosis, cervical region: Secondary | ICD-10-CM | POA: Diagnosis not present

## 2023-06-20 DIAGNOSIS — M50221 Other cervical disc displacement at C4-C5 level: Secondary | ICD-10-CM | POA: Diagnosis not present

## 2023-06-20 DIAGNOSIS — E041 Nontoxic single thyroid nodule: Secondary | ICD-10-CM | POA: Diagnosis not present

## 2023-06-20 DIAGNOSIS — I951 Orthostatic hypotension: Secondary | ICD-10-CM

## 2023-06-21 NOTE — Telephone Encounter (Signed)
Patient viewed in MyChart.

## 2023-06-28 ENCOUNTER — Other Ambulatory Visit: Payer: Self-pay | Admitting: Internal Medicine

## 2023-06-28 DIAGNOSIS — D520 Dietary folate deficiency anemia: Secondary | ICD-10-CM

## 2023-07-04 DIAGNOSIS — R972 Elevated prostate specific antigen [PSA]: Secondary | ICD-10-CM | POA: Diagnosis not present

## 2023-07-10 ENCOUNTER — Other Ambulatory Visit (HOSPITAL_BASED_OUTPATIENT_CLINIC_OR_DEPARTMENT_OTHER): Payer: Self-pay | Admitting: Urology

## 2023-07-10 ENCOUNTER — Telehealth (HOSPITAL_BASED_OUTPATIENT_CLINIC_OR_DEPARTMENT_OTHER): Payer: Self-pay

## 2023-07-10 DIAGNOSIS — Z85528 Personal history of other malignant neoplasm of kidney: Secondary | ICD-10-CM

## 2023-07-11 ENCOUNTER — Ambulatory Visit: Admitting: Neurology

## 2023-07-11 ENCOUNTER — Ambulatory Visit (HOSPITAL_BASED_OUTPATIENT_CLINIC_OR_DEPARTMENT_OTHER)
Admission: RE | Admit: 2023-07-11 | Discharge: 2023-07-11 | Disposition: A | Discharge status: Home / Self Care | Source: Ambulatory Visit | Attending: Urology | Admitting: Urology

## 2023-07-11 DIAGNOSIS — Z85528 Personal history of other malignant neoplasm of kidney: Secondary | ICD-10-CM | POA: Insufficient documentation

## 2023-07-11 MED ORDER — IOHEXOL 300 MG/ML  SOLN
80.0000 mL | Freq: Once | INTRAMUSCULAR | Status: AC | PRN
  Administered 2023-07-11: 80 mL via INTRAVENOUS

## 2023-07-16 ENCOUNTER — Encounter: Payer: Self-pay | Admitting: Internal Medicine

## 2023-07-16 DIAGNOSIS — R21 Rash and other nonspecific skin eruption: Secondary | ICD-10-CM | POA: Diagnosis not present

## 2023-07-17 ENCOUNTER — Other Ambulatory Visit (HOSPITAL_COMMUNITY): Payer: Self-pay | Admitting: Internal Medicine

## 2023-07-17 ENCOUNTER — Other Ambulatory Visit: Payer: Self-pay | Admitting: Internal Medicine

## 2023-07-17 DIAGNOSIS — I10 Essential (primary) hypertension: Secondary | ICD-10-CM | POA: Diagnosis not present

## 2023-07-17 DIAGNOSIS — I2510 Atherosclerotic heart disease of native coronary artery without angina pectoris: Secondary | ICD-10-CM | POA: Diagnosis not present

## 2023-07-17 DIAGNOSIS — R079 Chest pain, unspecified: Secondary | ICD-10-CM | POA: Diagnosis not present

## 2023-07-18 ENCOUNTER — Ambulatory Visit: Payer: Self-pay | Admitting: Internal Medicine

## 2023-07-19 LAB — NMR, LIPOPROFILE
Cholesterol, Total: 177 mg/dL (ref 100–199)
HDL Particle Number: 31.8 umol/L (ref >=30.5)
HDL-C: 49 mg/dL (ref >39)
LDL Particle Number: 1266 nmol/L — ABNORMAL HIGH (ref <1000)
LDL Size: 21.1 nm (ref >20.5)
LDL-C (NIH Calc): 112 mg/dL — ABNORMAL HIGH (ref 0–99)
LP-IR Score: 26 (ref <=45)
Small LDL Particle Number: 459 nmol/L (ref <=527)
Triglycerides: 84 mg/dL (ref 0–149)

## 2023-07-19 LAB — HEPATIC FUNCTION PANEL
ALT: 9 IU/L (ref 0–44)
AST: 14 IU/L (ref 0–40)
Albumin: 4.5 g/dL (ref 3.8–4.8)
Alkaline Phosphatase: 75 IU/L (ref 44–121)
Bilirubin Total: 0.8 mg/dL (ref 0.0–1.2)
Bilirubin, Direct: 0.24 mg/dL (ref 0.00–0.40)
Total Protein: 6.7 g/dL (ref 6.0–8.5)

## 2023-07-19 LAB — CK: Total CK: 50 U/L (ref 41–331)

## 2023-07-24 ENCOUNTER — Other Ambulatory Visit: Payer: Self-pay

## 2023-07-24 ENCOUNTER — Ambulatory Visit: Payer: Self-pay | Admitting: Neurology

## 2023-07-24 DIAGNOSIS — M4802 Spinal stenosis, cervical region: Secondary | ICD-10-CM

## 2023-07-25 DIAGNOSIS — N401 Enlarged prostate with lower urinary tract symptoms: Secondary | ICD-10-CM | POA: Diagnosis not present

## 2023-07-25 DIAGNOSIS — R972 Elevated prostate specific antigen [PSA]: Secondary | ICD-10-CM | POA: Diagnosis not present

## 2023-07-25 DIAGNOSIS — Z85528 Personal history of other malignant neoplasm of kidney: Secondary | ICD-10-CM | POA: Diagnosis not present

## 2023-07-25 DIAGNOSIS — R3915 Urgency of urination: Secondary | ICD-10-CM | POA: Diagnosis not present

## 2023-07-26 DIAGNOSIS — G959 Disease of spinal cord, unspecified: Secondary | ICD-10-CM | POA: Diagnosis not present

## 2023-07-26 DIAGNOSIS — Z6826 Body mass index (BMI) 26.0-26.9, adult: Secondary | ICD-10-CM | POA: Diagnosis not present

## 2023-07-26 DIAGNOSIS — M542 Cervicalgia: Secondary | ICD-10-CM | POA: Diagnosis not present

## 2023-07-30 ENCOUNTER — Encounter: Payer: Self-pay | Admitting: Internal Medicine

## 2023-08-08 ENCOUNTER — Ambulatory Visit (INDEPENDENT_AMBULATORY_CARE_PROVIDER_SITE_OTHER): Admitting: Internal Medicine

## 2023-08-08 ENCOUNTER — Encounter: Payer: Self-pay | Admitting: Internal Medicine

## 2023-08-08 VITALS — BP 112/72 | HR 79 | Temp 98.2°F | Ht 68.0 in | Wt 175.0 lb

## 2023-08-08 DIAGNOSIS — I2583 Coronary atherosclerosis due to lipid rich plaque: Secondary | ICD-10-CM

## 2023-08-08 DIAGNOSIS — E7800 Pure hypercholesterolemia, unspecified: Secondary | ICD-10-CM | POA: Insufficient documentation

## 2023-08-08 DIAGNOSIS — M4802 Spinal stenosis, cervical region: Secondary | ICD-10-CM

## 2023-08-08 DIAGNOSIS — N183 Chronic kidney disease, stage 3 unspecified: Secondary | ICD-10-CM | POA: Diagnosis not present

## 2023-08-08 DIAGNOSIS — R7303 Prediabetes: Secondary | ICD-10-CM | POA: Diagnosis not present

## 2023-08-08 DIAGNOSIS — I10 Essential (primary) hypertension: Secondary | ICD-10-CM | POA: Diagnosis not present

## 2023-08-08 DIAGNOSIS — M2550 Pain in unspecified joint: Secondary | ICD-10-CM | POA: Insufficient documentation

## 2023-08-08 DIAGNOSIS — R6882 Decreased libido: Secondary | ICD-10-CM | POA: Insufficient documentation

## 2023-08-08 DIAGNOSIS — Z6827 Body mass index (BMI) 27.0-27.9, adult: Secondary | ICD-10-CM | POA: Insufficient documentation

## 2023-08-08 DIAGNOSIS — R29898 Other symptoms and signs involving the musculoskeletal system: Secondary | ICD-10-CM

## 2023-08-08 DIAGNOSIS — I2510 Atherosclerotic heart disease of native coronary artery without angina pectoris: Secondary | ICD-10-CM

## 2023-08-08 DIAGNOSIS — G8929 Other chronic pain: Secondary | ICD-10-CM | POA: Diagnosis not present

## 2023-08-08 DIAGNOSIS — M545 Low back pain, unspecified: Secondary | ICD-10-CM

## 2023-08-08 DIAGNOSIS — R5383 Other fatigue: Secondary | ICD-10-CM | POA: Insufficient documentation

## 2023-08-08 DIAGNOSIS — R7989 Other specified abnormal findings of blood chemistry: Secondary | ICD-10-CM | POA: Insufficient documentation

## 2023-08-08 NOTE — Assessment & Plan Note (Addendum)
 2021 Coronary calcium  score of 180 Agatston units  Statin intolerant Statin intolerant (Crestor , Lipitor) Pt declined fish oil  Try Mega Red Krill oil

## 2023-08-08 NOTE — Patient Instructions (Addendum)
 Mega Red Krill oil  USEFUL THINGS FOR ARTHRITIS and musculoskeletal pains:    A rice sock heating pad refers to a homemade heating pad created by filling a sock with uncooked rice, which can be heated in a microwave to provide a warm compress for sore muscles, pain relief, or other applications; essentially, it's a simple way to generate heat using readily available materials.  Key points about rice sock heat: How to make it: Fill a clean sock (preferably a tube sock) about 2/3 full with uncooked rice, tie a knot at the top to secure the rice inside.  Heating it up: Place the rice sock in the microwave and heat in short intervals (usually around 30 seconds at a time) until it reaches the desired warmth.  Important considerations: Check temperature before applying: Always test the temperature of the rice sock before applying it to your skin to avoid burns.  Use a towel to protect skin: Wrap the rice sock in a thin towel to distribute the heat evenly and protect your skin.  Uses: Muscle aches and pains  Menstrual cramps  Neck pain  Arthritis discomfort      BLUE EMU CREAM: Use it 2-3 times a day on painful areas

## 2023-08-08 NOTE — Assessment & Plan Note (Signed)
 Doing much better off Olmesartan 

## 2023-08-08 NOTE — Progress Notes (Signed)
 Subjective:  Patient ID: Chad Palmer, male    DOB: Apr 08, 1945  Age: 78 y.o. 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  Follow-up on leg weakness Doing much better off Olmesartan   Outpatient Medications Prior to Visit  Medication Sig Dispense Refill   aspirin  EC (ASPIRIN  LOW DOSE) 81 MG tablet TAKE 1 TABLET BY MOUTH DAILY SWALLOW WHOLE 90 tablet 3   Calcium -Vitamin D -Vitamin K (CALCIUM  + D + K PO) Take 1 tablet by mouth daily.     clopidogrel  (PLAVIX ) 75 MG tablet TAKE 1 TABLET BY MOUTH DAILY 90 tablet 3   co-enzyme Q-10 30 MG capsule Take 30 mg by mouth daily.     Cyanocobalamin  (B-12) 2500 MCG TABS Take 2,500 mcg by mouth daily.     finasteride  (PROSCAR ) 5 MG tablet TAKE 1 TABLET BY MOUTH DAILY 90 tablet 3   fluticasone  (FLONASE ) 50 MCG/ACT nasal spray PLACE 2 SPRAYS INTO BOTH NOSTRILS DAILY 16 mL 0   folic acid  (FOLVITE ) 1 MG tablet TAKE 1 TABLET BY MOUTH DAILY 90 tablet 1   ipratropium (ATROVENT ) 0.03 % nasal spray Place 2 sprays into both nostrils 2 (two) times daily as needed for rhinitis. 30 mL 12   lidocaine  (LIDODERM ) 5 % Place 1 patch onto the skin daily as needed (pain). Remove & Discard patch within 12 hours or as directed by MD     nitroGLYCERIN  (NITROSTAT ) 0.4 MG SL tablet DISSOLVE 1 TABLET UNDER TONGUE FOR CHEST PAIN - IF PAIN REMAINS AFTER 5 MIN, CALL 911 AND REPEAT DOSE. MAX 3 TABLETS IN 15 MINUTES 25 tablet 0   olmesartan  (BENICAR ) 20 MG tablet Take 1 tablet (20 mg total) by mouth daily. 90 tablet 3   Probiotic Product (ALIGN) 4 MG CAPS Take 4 mg by mouth at bedtime.     sildenafil (VIAGRA) 100 MG tablet Take 50 mg by mouth daily as needed for erectile dysfunction.     zolpidem  (AMBIEN ) 10 MG tablet Take 1 tablet (10 mg total) by mouth at bedtime. 90 tablet 1   rosuvastatin  (CRESTOR ) 10 MG tablet Take 1 tablet (10 mg total) by mouth daily. 90 tablet 3   No facility-administered  medications prior to visit.    ROS: Review of Systems  Constitutional:  Negative for appetite change, fatigue, fever and unexpected weight change.  HENT:  Negative for congestion, nosebleeds, sneezing, sore throat and trouble swallowing.   Eyes:  Negative for itching and visual disturbance.  Respiratory:  Negative for cough.   Cardiovascular:  Negative for chest pain, palpitations and leg swelling.  Gastrointestinal:  Negative for abdominal distention, blood in stool, diarrhea and nausea.  Genitourinary:  Negative for frequency and hematuria.  Musculoskeletal:  Negative for back pain, gait problem, joint swelling and neck pain.  Skin:  Negative for rash.  Neurological:  Negative for dizziness, tremors, speech difficulty and weakness.  Psychiatric/Behavioral:  Negative for agitation, dysphoric mood and sleep disturbance. The patient is not nervous/anxious.     Objective:  BP 112/72   Pulse 79   Temp 98.2 F (36.8 C) (Oral)   Ht 5' 8 (1.727 m)   Wt 175 lb (79.4 kg)   SpO2 98%   BMI 26.61 kg/m   BP Readings from Last 3 Encounters:  08/08/23 112/72  05/09/23 (!) 140/100  11/21/22 134/80    Wt Readings from Last 3 Encounters:  08/08/23 175 lb (79.4  kg)  06/06/23 173 lb (78.5 kg)  05/09/23 174 lb 12.8 oz (79.3 kg)    Physical Exam Constitutional:      General: He is not in acute distress.    Appearance: He is well-developed.     Comments: NAD  Eyes:     Conjunctiva/sclera: Conjunctivae normal.     Pupils: Pupils are equal, round, and reactive to light.  Neck:     Thyroid : No thyromegaly.     Vascular: No JVD.  Cardiovascular:     Rate and Rhythm: Normal rate and regular rhythm.     Heart sounds: Normal heart sounds. No murmur heard.    No friction rub. No gallop.  Pulmonary:     Effort: Pulmonary effort is normal. No respiratory distress.     Breath sounds: Normal breath sounds. No wheezing or rales.  Chest:     Chest wall: No tenderness.  Abdominal:      General: Bowel sounds are normal. There is no distension.     Palpations: Abdomen is soft. There is no mass.     Tenderness: There is no abdominal tenderness. There is no guarding or rebound.  Musculoskeletal:        General: No tenderness. Normal range of motion.     Cervical back: Normal range of motion.  Lymphadenopathy:     Cervical: No cervical adenopathy.  Skin:    General: Skin is warm and dry.     Findings: No rash.  Neurological:     Mental Status: He is alert and oriented to person, place, and time.     Cranial Nerves: No cranial nerve deficit.     Motor: No abnormal muscle tone.     Coordination: Coordination normal.     Gait: Gait normal.     Deep Tendon Reflexes: Reflexes are normal and symmetric.  Psychiatric:        Behavior: Behavior normal.        Thought Content: Thought content normal.        Judgment: Judgment normal.     Lab Results  Component Value Date   WBC 5.4 05/09/2023   HGB 12.9 (L) 05/09/2023   HCT 41.4 05/09/2023   PLT 168.0 05/09/2023   GLUCOSE 92 05/09/2023   CHOL 192 05/09/2023   TRIG 69.0 05/09/2023   HDL 53.20 05/09/2023   LDLCALC 125 (H) 05/09/2023   ALT 9 07/17/2023   AST 14 07/17/2023   NA 142 05/09/2023   K 4.5 05/09/2023   CL 103 05/09/2023   CREATININE 1.90 (H) 05/09/2023   BUN 21 05/09/2023   CO2 31 05/09/2023   TSH 1.56 05/09/2023   PSA 7.96 (H) 05/09/2023   HGBA1C 6.0 05/09/2023    CT CHEST ABDOMEN PELVIS W CONTRAST Result Date: 07/11/2023 EXAMINATION: CT CHEST ABDOMEN PELVIS W CONTRAST CLINICAL INDICATION: Male, 78 years old. History of kidney cancer TECHNIQUE: Axial CT of the chest, abdomen, and pelvis with 100 cc Omnipaque  300 intravenous contrast. Multiplanar reformations provided. Unless otherwise specified, incidental thyroid , adrenal, renal lesions do not require dedicated imaging follow up. Additionally, any mentioned pulmonary nodules do not require dedicated imaging follow-up based on the Fleischner guidelines  unless otherwise specified. Coronary calcifications are not identified unless otherwise specified. COMPARISON: 12/12/2019, 08/04/2022, 11/04/2019, 11/21/2019 FINDINGS: CHEST: Small nodule noted in the left thyroid  lobe. The thoracic aorta is not aneurysmal. Scattered atherosclerotic changes are present. The main pulmonary artery is normal in caliber. The heart is normal in size. There are coronary calcifications. No  free fluid or lymphadenopathy. The trachea and mainstem bronchi are patent. Subsegmental atelectatic changes are noted within the lung bases. No new or enlarging pulmonary nodules are appreciated. There are a few stable scattered pulmonary micronodules measuring up to 3 mm. ABDOMEN/PELVIS: The liver appears normal. The gallbladder is normal. The spleen is borderline enlarged. The pancreas is normal. The adrenals are normal. The right kidney contains a small cyst. The left kidney is surgically absent. The abdominal aorta is normal in caliber. Scattered atherosclerotic changes are present the bladder is normal. The prostate is enlarged. There is colonic diverticulosis. The appendix is normal. Large and small bowel loops are otherwise within normal limits. No free fluid or lymphadenopathy. BONES: There are degenerative changes of the spine and bony pelvis. No concerning osseous lesions. IMPRESSION: Status post left nephrectomy with no evidence for malignancy within the chest, abdomen, or pelvis. DOSE REDUCTION: All CT scans are performed using radiation dose reduction techniques, when applicable. Technical factors are evaluated and adjusted to ensure appropriate moderation of exposure. Electronically signed by: [place] Engel MD 07/11/2023 06:19 PM EDT RP Workstation: [id number]    Assessment & Plan:   Problem List Items Addressed This Visit     Essential hypertension   Doing much better off Olmesartan       Low back pain   Starting PT Use heat as needed      CRI (chronic renal insufficiency),  stage 3 (moderate) (HCC)   Hydrate well.  Avoid nephrotoxic meds      CAD (coronary artery disease)   2021 Coronary calcium  score of 180 Agatston units  Statin intolerant Statin intolerant (Crestor , Lipitor) Pt declined fish oil  Try Mega Red Krill oil      Leg weakness - Primary   Doing much better off Olmesartan , off statins      Prediabetes   Monitor A1c      Cervical spinal stenosis   F/u w/Dr Leigh Starting PT         No orders of the defined types were placed in this encounter.     Follow-up: No follow-ups on file.  Marolyn Noel, MD

## 2023-08-08 NOTE — Assessment & Plan Note (Signed)
 Doing much better off Olmesartan , off statins

## 2023-08-08 NOTE — Assessment & Plan Note (Addendum)
 Starting PT Use heat as needed

## 2023-08-08 NOTE — Assessment & Plan Note (Signed)
 F/u w/Dr Leigh Starting PT

## 2023-08-08 NOTE — Assessment & Plan Note (Signed)
Monitor A1c 

## 2023-08-09 NOTE — Telephone Encounter (Signed)
 Copied from CRM [id number]. Topic: Clinical - Request for Lab/Test Order >> Aug 09, 2023 10:14 AM [place] E wrote: Reason for CRM: Patient had a visit yesterday with PCP and would like an ultrasound of his thyroid  completed at any [facility] location.

## 2023-08-13 ENCOUNTER — Other Ambulatory Visit: Payer: Self-pay | Admitting: Internal Medicine

## 2023-08-13 DIAGNOSIS — E041 Nontoxic single thyroid nodule: Secondary | ICD-10-CM

## 2023-08-16 DIAGNOSIS — S161XXA Strain of muscle, fascia and tendon at neck level, initial encounter: Secondary | ICD-10-CM | POA: Diagnosis not present

## 2023-08-21 DIAGNOSIS — S161XXA Strain of muscle, fascia and tendon at neck level, initial encounter: Secondary | ICD-10-CM | POA: Diagnosis not present

## 2023-08-22 ENCOUNTER — Ambulatory Visit
Admission: RE | Admit: 2023-08-22 | Discharge: 2023-08-22 | Disposition: A | Discharge status: Home / Self Care | Source: Ambulatory Visit | Attending: Internal Medicine | Admitting: Internal Medicine

## 2023-08-22 DIAGNOSIS — E042 Nontoxic multinodular goiter: Secondary | ICD-10-CM | POA: Diagnosis not present

## 2023-08-22 DIAGNOSIS — E041 Nontoxic single thyroid nodule: Secondary | ICD-10-CM

## 2023-08-23 DIAGNOSIS — S161XXA Strain of muscle, fascia and tendon at neck level, initial encounter: Secondary | ICD-10-CM | POA: Diagnosis not present

## 2023-08-26 ENCOUNTER — Encounter: Payer: Self-pay | Admitting: Internal Medicine

## 2023-08-26 NOTE — Assessment & Plan Note (Signed)
 Hydrate well.  Avoid nephrotoxic meds

## 2023-08-27 DIAGNOSIS — S161XXA Strain of muscle, fascia and tendon at neck level, initial encounter: Secondary | ICD-10-CM | POA: Diagnosis not present

## 2023-08-30 ENCOUNTER — Encounter: Payer: Self-pay | Admitting: Internal Medicine

## 2023-08-31 DIAGNOSIS — S161XXA Strain of muscle, fascia and tendon at neck level, initial encounter: Secondary | ICD-10-CM | POA: Diagnosis not present

## 2023-09-02 ENCOUNTER — Ambulatory Visit: Payer: Self-pay | Admitting: Internal Medicine

## 2023-09-03 DIAGNOSIS — S161XXA Strain of muscle, fascia and tendon at neck level, initial encounter: Secondary | ICD-10-CM | POA: Diagnosis not present

## 2023-09-05 DIAGNOSIS — S161XXA Strain of muscle, fascia and tendon at neck level, initial encounter: Secondary | ICD-10-CM | POA: Diagnosis not present

## 2023-09-08 DIAGNOSIS — S161XXA Strain of muscle, fascia and tendon at neck level, initial encounter: Secondary | ICD-10-CM | POA: Diagnosis not present

## 2023-09-10 DIAGNOSIS — S161XXA Strain of muscle, fascia and tendon at neck level, initial encounter: Secondary | ICD-10-CM | POA: Diagnosis not present

## 2023-09-13 DIAGNOSIS — S161XXA Strain of muscle, fascia and tendon at neck level, initial encounter: Secondary | ICD-10-CM | POA: Diagnosis not present

## 2023-09-20 DIAGNOSIS — S161XXA Strain of muscle, fascia and tendon at neck level, initial encounter: Secondary | ICD-10-CM | POA: Diagnosis not present

## 2023-09-24 ENCOUNTER — Other Ambulatory Visit: Payer: Self-pay | Admitting: Internal Medicine

## 2023-09-24 DIAGNOSIS — S161XXA Strain of muscle, fascia and tendon at neck level, initial encounter: Secondary | ICD-10-CM | POA: Diagnosis not present

## 2023-10-01 DIAGNOSIS — S161XXA Strain of muscle, fascia and tendon at neck level, initial encounter: Secondary | ICD-10-CM | POA: Diagnosis not present

## 2023-10-02 DIAGNOSIS — G959 Disease of spinal cord, unspecified: Secondary | ICD-10-CM | POA: Diagnosis not present

## 2023-10-02 DIAGNOSIS — Z6826 Body mass index (BMI) 26.0-26.9, adult: Secondary | ICD-10-CM | POA: Diagnosis not present

## 2023-10-04 DIAGNOSIS — S161XXA Strain of muscle, fascia and tendon at neck level, initial encounter: Secondary | ICD-10-CM | POA: Diagnosis not present

## 2023-10-09 DIAGNOSIS — S161XXA Strain of muscle, fascia and tendon at neck level, initial encounter: Secondary | ICD-10-CM | POA: Diagnosis not present

## 2023-10-11 DIAGNOSIS — S161XXA Strain of muscle, fascia and tendon at neck level, initial encounter: Secondary | ICD-10-CM | POA: Diagnosis not present

## 2023-10-13 DIAGNOSIS — S161XXA Strain of muscle, fascia and tendon at neck level, initial encounter: Secondary | ICD-10-CM | POA: Diagnosis not present

## 2023-10-15 DIAGNOSIS — S161XXA Strain of muscle, fascia and tendon at neck level, initial encounter: Secondary | ICD-10-CM | POA: Diagnosis not present

## 2023-10-17 DIAGNOSIS — S161XXA Strain of muscle, fascia and tendon at neck level, initial encounter: Secondary | ICD-10-CM | POA: Diagnosis not present

## 2023-10-25 ENCOUNTER — Other Ambulatory Visit (INDEPENDENT_AMBULATORY_CARE_PROVIDER_SITE_OTHER): Payer: Self-pay | Admitting: Otolaryngology

## 2023-10-25 DIAGNOSIS — H938X3 Other specified disorders of ear, bilateral: Secondary | ICD-10-CM

## 2023-10-25 DIAGNOSIS — R972 Elevated prostate specific antigen [PSA]: Secondary | ICD-10-CM | POA: Diagnosis not present

## 2023-10-25 DIAGNOSIS — R42 Dizziness and giddiness: Secondary | ICD-10-CM

## 2023-10-25 DIAGNOSIS — H9193 Unspecified hearing loss, bilateral: Secondary | ICD-10-CM

## 2023-10-25 DIAGNOSIS — Z85528 Personal history of other malignant neoplasm of kidney: Secondary | ICD-10-CM | POA: Diagnosis not present

## 2023-10-26 DIAGNOSIS — S161XXA Strain of muscle, fascia and tendon at neck level, initial encounter: Secondary | ICD-10-CM | POA: Diagnosis not present

## 2023-10-29 ENCOUNTER — Other Ambulatory Visit: Payer: Self-pay | Admitting: Internal Medicine

## 2023-10-29 DIAGNOSIS — S161XXA Strain of muscle, fascia and tendon at neck level, initial encounter: Secondary | ICD-10-CM | POA: Diagnosis not present

## 2023-10-29 DIAGNOSIS — J301 Allergic rhinitis due to pollen: Secondary | ICD-10-CM

## 2023-10-31 DIAGNOSIS — S161XXA Strain of muscle, fascia and tendon at neck level, initial encounter: Secondary | ICD-10-CM | POA: Diagnosis not present

## 2023-11-14 ENCOUNTER — Ambulatory Visit: Payer: Medicare Other

## 2023-11-14 VITALS — BP 130/80 | HR 76 | Ht 65.0 in | Wt 177.0 lb

## 2023-11-14 DIAGNOSIS — Z0000 Encounter for general adult medical examination without abnormal findings: Secondary | ICD-10-CM

## 2023-11-14 DIAGNOSIS — Z23 Encounter for immunization: Secondary | ICD-10-CM | POA: Diagnosis not present

## 2023-11-14 NOTE — Progress Notes (Addendum)
 Subjective:   Courtney F Caloca is a 78 y.o. who presents for a Medicare Wellness preventive visit.  As a reminder, Annual Wellness Visits don't include a physical exam, and some assessments may be limited, especially if this visit is performed virtually. We may recommend an in-person follow-up visit with your provider if needed.  Visit Complete: In person  Persons Participating in Visit: Patient.  AWV Questionnaire: No: Patient Medicare AWV questionnaire was not completed prior to this visit.  Cardiac Risk Factors include: advanced age (>92men, >83 women);hypertension;male gender     Objective:    Today's Vitals   11/14/23 1457  BP: 130/80  Pulse: 76  Weight: 177 lb (80.3 kg)  Height: 5' 5 (1.651 m)   Body mass index is 29.45 kg/m.     11/14/2023    2:54 PM 06/06/2023    2:47 PM 11/13/2022    4:06 PM 08/01/2022    8:01 AM 11/09/2021    3:40 PM 07/20/2020   12:36 PM 12/22/2019    1:11 PM  Advanced Directives  Does Patient Have a Medical Advance Directive? Yes Yes Yes Yes Yes Yes Yes  Type of Estate agent of [place];Living will Living will;Healthcare Power of [employer] Power of [place];Living will Healthcare Power of [place];Living will Healthcare Power of [place];Living will Living will;Healthcare Power of [employer] Power of [place];Living will  Does patient want to make changes to medical advance directive?    No - Patient declined  No - Patient declined No - Patient declined  Copy of Healthcare Power of Attorney in Chart? No - copy requested  No - copy requested No - copy requested No - copy requested No - copy requested     Current Medications (verified) Outpatient Encounter Medications as of 11/14/2023  Medication Sig   aspirin  EC (ASPIRIN  LOW DOSE) 81 MG tablet TAKE 1 TABLET BY MOUTH DAILY SWALLOW WHOLE   Calcium -Vitamin D -Vitamin K (CALCIUM  + D + K PO) Take 1 tablet by mouth daily.   clopidogrel  (PLAVIX ) 75 MG tablet TAKE  1 TABLET BY MOUTH DAILY   co-enzyme Q-10 30 MG capsule Take 30 mg by mouth daily.   Cyanocobalamin  (B-12) 2500 MCG TABS Take 2,500 mcg by mouth daily.   finasteride  (PROSCAR ) 5 MG tablet TAKE 1 TABLET BY MOUTH DAILY   fluticasone  (FLONASE ) 50 MCG/ACT nasal spray PLACE 2 SPRAYS INTO EACH NOSTRIL DAILY   folic acid  (FOLVITE ) 1 MG tablet TAKE 1 TABLET BY MOUTH DAILY   ipratropium (ATROVENT ) 0.03 % nasal spray Place 2 sprays into both nostrils 2 (two) times daily as needed for rhinitis.   lidocaine  (LIDODERM ) 5 % Place 1 patch onto the skin daily as needed (pain). Remove & Discard patch within 12 hours or as directed by MD   nitroGLYCERIN  (NITROSTAT ) 0.4 MG SL tablet DISSOLVE 1 TABLET UNDER TONGUE FOR CHEST PAIN - IF PAIN REMAINS AFTER 5 MIN, CALL 911 AND REPEAT DOSE. MAX 3 TABLETS IN 15 MINUTES   Probiotic Product (ALIGN) 4 MG CAPS Take 4 mg by mouth at bedtime.   sildenafil (VIAGRA) 100 MG tablet Take 50 mg by mouth daily as needed for erectile dysfunction.   zolpidem  (AMBIEN ) 10 MG tablet Take 1 tablet (10 mg total) by mouth at bedtime.   olmesartan  (BENICAR ) 20 MG tablet Take 1 tablet (20 mg total) by mouth daily. (Patient not taking: Reported on 11/14/2023)   No facility-administered encounter medications on file as of 11/14/2023.    Allergies (verified) Iodinated contrast  media, Iohexol , Crestor  [rosuvastatin ], Levofloxacin, Lipitor [atorvastatin ], Omeprazole , Rapaflo [silodosin], and Bactrim  [sulfamethoxazole -trimethoprim ]   History: Past Medical History:  Diagnosis Date   Allergic rhinitis    Arthritis    BPH (benign prostatic hyperplasia)    Cancer (HCC)    kidney    Cataract    bilateral    Cervical spinal stenosis    Dr Dohmier   Colon polyp    Diverticulosis of colon    ED (erectile dysfunction)    Elevated PSA    GERD (gastroesophageal reflux disease)    Hemorrhoids    HTN (hypertension)    Microcytosis    Past Surgical History:  Procedure Laterality Date   cataract  surg     BIL   CORONARY CTO INTERVENTION N/A 08/01/2022   Procedure: CORONARY CTO INTERVENTION;  Surgeon: Wendel Lurena POUR, MD;  Location: [facility];  Service: Cardiovascular;  Laterality: N/A;   CORONARY ULTRASOUND/IVUS N/A 08/01/2022   Procedure: Coronary Ultrasound/IVUS;  Surgeon: Wendel Lurena POUR, MD;  Location: [facility];  Service: Cardiovascular;  Laterality: N/A;   HEMORRHOID SURGERY     LAPAROSCOPIC NEPHRECTOMY Left 12/22/2019   Procedure: LAPAROSCOPIC RADICAL NEPHRECTOMY;  Surgeon: Renda Glance, MD;  Location: [facility];  Service: Urology;  Laterality: Left;   LEFT HEART CATH AND CORONARY ANGIOGRAPHY N/A 08/01/2022   Procedure: LEFT HEART CATH AND CORONARY ANGIOGRAPHY;  Surgeon: Wendel Lurena POUR, MD;  Location: [facility];  Service: Cardiovascular;  Laterality: N/A;   TOENAIL EXCISION  2009   ALL   TONSILLECTOMY     VASECTOMY     WISDOM TOOTH EXTRACTION     Family History  Problem Relation Age of Onset   Alzheimer's disease Mother    Heart disease Father    Alzheimer's disease Brother    Hypertension Other    Colon cancer Neg Hx    Esophageal cancer Neg Hx    Stomach cancer Neg Hx    Rectal cancer Neg Hx    Social History   Socioeconomic History   Marital status: Married    Spouse name: Not on file   Number of children: 2   Years of education: Not on file   Highest education level: Bachelor's degree (e.g., BA, AB, BS)  Occupational History   Occupation: Publisher/ retired    Comment: Racing cars  Tobacco Use   Smoking status: Never   Smokeless tobacco: Never  Vaping Use   Vaping status: Never Used  Substance and Sexual Activity   Alcohol use: No    Alcohol/week: 0.0 standard drinks of alcohol   Drug use: No   Sexual activity: Yes  Other Topics Concern   Not on file  Social History Narrative      Are you right handed or left handed? right   Are you currently employed ?    What is your current occupation?retired   Do you live at  home alone?   Who lives with you? Wife    What type of home do you live in: 1 story or 2 story? two    Caffiene 1-2 cups in am   Social Drivers of Health   Financial Resource Strain: Low Risk  (11/14/2023)   Overall Financial Resource Strain (CARDIA)    Difficulty of Paying Living Expenses: Not hard at all  Food Insecurity: No Food Insecurity (11/14/2023)   Hunger Vital Sign    Worried About Running Out of Food in the Last Year: Never true  Ran Out of Food in the Last Year: Never true  Transportation Needs: No Transportation Needs (11/14/2023)   PRAPARE - Administrator, Civil Service (Medical): No    Lack of Transportation (Non-Medical): No  Physical Activity: Sufficiently Active (11/14/2023)   Exercise Vital Sign    Days of Exercise per Week: 4 days    Minutes of Exercise per Session: 40 min  Stress: No Stress Concern Present (11/14/2023)   [employer] of Occupational Health - Occupational Stress Questionnaire    Feeling of Stress: Not at all  Social Connections: Moderately Integrated (11/14/2023)   Social Connection and Isolation Panel    Frequency of Communication with Friends and Family: Twice a week    Frequency of Social Gatherings with Friends and Family: Twice a week    Attends Religious Services: Never    Diplomatic Services operational officer: No    Attends Engineer, structural: 1 to 4 times per year    Marital Status: Married    Tobacco Counseling Counseling given: Not Answered    Clinical Intake:  Pre-visit preparation completed: Yes  Pain : No/denies pain     BMI - recorded: 29.45 Nutritional Status: BMI 25 -29 Overweight Nutritional Risks: None Diabetes: No  Lab Results  Component Value Date   HGBA1C 6.0 05/09/2023   HGBA1C 6.1 (H) 09/25/2022   HGBA1C 5.9 02/19/2020     How often do you need to have someone help you when you read instructions, pamphlets, or other written materials from your doctor or pharmacy?: 1 -  Never  Interpreter Needed?: No  Information entered by :: Verdie Saba, CMA   Activities of Daily Living     11/14/2023    2:58 PM  In your present state of health, do you have any difficulty performing the following activities:  Hearing? 0  Vision? 0  Difficulty concentrating or making decisions? 0  Walking or climbing stairs? 0  Dressing or bathing? 0  Doing errands, shopping? 0  Preparing Food and eating ? N  Using the Toilet? N  In the past six months, have you accidently leaked urine? Y  Do you have problems with loss of bowel control? N  Managing your Medications? N  Managing your Finances? N  Housekeeping or managing your Housekeeping? N    Patient Care Team: Plotnikov, Karlynn GAILS, MD as PCP - General (Internal Medicine) Okey Vina GAILS, MD as PCP - Cardiology (Cardiology) Jakie Alm SAUNDERS, MD as Attending Physician (Gastroenterology) Renda Glance, MD as Consulting Physician (Urology) Melba Pool, DDS as Referring Physician (Dentistry) Macel Jayson PARAS, MD as Consulting Physician (Nephrology) Tobie Eldora NOVAK, MD as Consulting Physician (Otolaryngology) Thukkani, Arun K, MD as Consulting Physician (Cardiology) Elspeth Lauraine DEL, OD as Referring Physician (Ophthalmology)  I have updated your Care Teams any recent Medical Services you may have received from other providers in the past year.     Assessment:   This is a routine wellness examination for [place].  Hearing/Vision screen Hearing Screening - Comments:: Denies hearing difficulties   Vision Screening - Comments:: Denies vision concerns   Goals Addressed               This Visit's Progress     Patient Stated (pt-stated)        Patient stated he plans to continue exercise and manage spinal stenosis and bp readings.         Depression Screen     11/14/2023    3:00 PM  11/13/2022    4:06 PM 08/22/2022    2:33 PM 08/04/2022    7:53 AM 05/16/2022    1:31 PM 11/09/2021    3:22 PM 04/13/2021    4:06  PM  PHQ 2/9 Scores  PHQ - 2 Score 0 0 0 0 0 0 0  PHQ- 9 Score 0 0 2        Fall Risk     11/14/2023    2:59 PM 06/06/2023    2:46 PM 11/13/2022    3:17 PM 11/09/2022    4:25 PM 09/15/2022    2:05 PM  Fall Risk   Falls in the past year? 0 1 1 1  0  Number falls in past yr: 0 0 0 0 0  Injury with Fall? 0 1 1 1  0  Risk for fall due to : No Fall Risks    Impaired balance/gait;Orthopedic patient  Follow up Falls evaluation completed;Falls prevention discussed Falls evaluation completed Falls prevention discussed  Falls evaluation completed    MEDICARE RISK AT HOME:  Medicare Risk at Home Any stairs in or around the home?: Yes If so, are there any without handrails?: No Home free of loose throw rugs in walkways, pet beds, electrical cords, etc?: Yes Adequate lighting in your home to reduce risk of falls?: Yes Life alert?: Yes (watch) Use of a cane, walker or w/c?: No Grab bars in the bathroom?: Yes Shower chair or bench in shower?: Yes Elevated toilet seat or a handicapped toilet?: Yes  TIMED UP AND GO:  Was the test performed?  No  Cognitive Function: 6CIT completed    07/02/2018    8:26 AM 06/22/2017    8:43 AM  MMSE - Mini Mental State Exam  Orientation to time 5 5  Orientation to Place 5 5  Registration 3 3  Attention/ Calculation 5 5  Recall 3 3  Language- name 2 objects 2 2  Language- repeat 1 1  Language- follow 3 step command 3 3  Language- read & follow direction 1 1  Write a sentence 1 1  Copy design 1 1  Total score 30 30        11/14/2023    3:03 PM 11/13/2022    3:18 PM 11/09/2021    3:24 PM  6CIT Screen  What Year? 0 points 0 points 0 points  What month? 0 points 0 points 0 points  What time? 0 points 0 points 0 points  Count back from 20 0 points 0 points 0 points  Months in reverse 0 points 0 points 0 points  Repeat phrase 0 points 0 points 0 points  Total Score 0 points 0 points 0 points    Immunizations Immunization History  Administered  Date(s) Administered   Fluad Trivalent(High Dose 65+) 11/13/2022   INFLUENZA, HIGH DOSE SEASONAL PF 12/12/2014, 12/11/2017, 12/11/2017, 11/14/2023   Influenza,inj,Quad PF,6+ Mos 11/28/2016   Influenza-Unspecified 11/06/2012, 11/06/2013, 11/13/2019, 10/29/2020   Moderna Sars-Covid-2 Vaccination 03/06/2019, 04/03/2019   Pneumococcal Conjugate-13 03/24/2013   Pneumococcal Polysaccharide-23 04/23/2009, 06/22/2015   Td 05/16/2010   Tdap 11/25/2020   Zoster, Live 09/06/2007    Screening Tests Health Maintenance  Topic Date Due   Zoster Vaccines- Shingrix (1 of 2) 09/05/1962   COVID-19 Vaccine (3 - Moderna risk series) 05/01/2019   Colonoscopy  02/19/2024   Medicare Annual Wellness (AWV)  11/13/2024   DTaP/Tdap/Td (3 - Td or Tdap) 11/26/2030   Pneumococcal Vaccine: 50+ Years  Completed   Influenza Vaccine  Completed  Meningococcal B Vaccine  Aged Out   Hepatitis C Screening  Discontinued    Health Maintenance Items Addressed:  Vaccines Given today: Influenza   Additional Screening:  Vision Screening: Recommended annual ophthalmology exams for early detection of glaucoma and other disorders of the eye. Is the patient up to date with their annual eye exam?  Yes  Who is the provider or what is the name of the office in which the patient attends annual eye exams? [facility]  Dental Screening: Recommended annual dental exams for proper oral hygiene  Community Resource Referral / Chronic Care Management: CRR required this visit?  No   CCM required this visit?  No   Plan:    I have personally reviewed and noted the following in the patient's chart:   Medical and social history Use of alcohol, tobacco or illicit drugs  Current medications and supplements including opioid prescriptions. Patient is not currently taking opioid prescriptions. Functional ability and status Nutritional status Physical activity Advanced directives List of other physicians Hospitalizations,  surgeries, and ER visits in previous 12 months Vitals Screenings to include cognitive, depression, and falls Referrals and appointments  In addition, I have reviewed and discussed with patient certain preventive protocols, quality metrics, and best practice recommendations. A written personalized care plan for preventive services as well as general preventive health recommendations were provided to patient.   Verdie CHRISTELLA Saba, CMA   11/14/2023   After Visit Summary: (In Person-Declined) Patient declined AVS at this time.  Notes: Physical appt scheduled w/PCP for 12/2023  Medical screening examination/treatment/procedure(s) were performed by non-physician practitioner and as supervising physician I was immediately available for consultation/collaboration.  I agree with above. Karlynn Noel, MD

## 2023-11-14 NOTE — Patient Instructions (Addendum)
 Chad Palmer,  Thank you for taking the time for your Medicare Wellness Visit. I appreciate your continued commitment to your health goals. Please review the care plan we discussed, and feel free to reach out if I can assist you further.  Medicare recommends these wellness visits once per year to help you and your care team stay ahead of potential health issues. These visits are designed to focus on prevention, allowing your provider to concentrate on managing your acute and chronic conditions during your regular appointments.  Please note that Annual Wellness Visits do not include a physical exam. Some assessments may be limited, especially if the visit was conducted virtually. If needed, we may recommend a separate in-person follow-up with your provider.  Ongoing Care Seeing your primary care provider every 3 to 6 months helps us  monitor your health and provide consistent, personalized care.   Referrals If a referral was made during today's visit and you haven't received any updates within two weeks, please contact the referred provider directly to check on the status.  Recommended Screenings:  Health Maintenance  Topic Date Due   Zoster (Shingles) Vaccine (1 of 2) 09/05/1962   COVID-19 Vaccine (3 - Moderna risk series) 05/01/2019   Colon Cancer Screening  02/19/2024   Medicare Annual Wellness Visit  11/13/2024   DTaP/Tdap/Td vaccine (3 - Td or Tdap) 11/26/2030   Pneumococcal Vaccine for age over 62  Completed   Flu Shot  Completed   Meningitis B Vaccine  Aged Out   Hepatitis C Screening  Discontinued       11/14/2023    2:54 PM  Advanced Directives  Does Patient Have a Medical Advance Directive? Yes  Type of Estate agent of [place];Living will  Copy of Healthcare Power of Attorney in Chart? No - copy requested   Advance Care Planning is important because it: Ensures you receive medical care that aligns with your values, goals, and preferences. Provides  guidance to your family and loved ones, reducing the emotional burden of decision-making during critical moments.  Vision: Annual vision screenings are recommended for early detection of glaucoma, cataracts, and diabetic retinopathy. These exams can also reveal signs of chronic conditions such as diabetes and high blood pressure.  Dental: Annual dental screenings help detect early signs of oral cancer, gum disease, and other conditions linked to overall health, including heart disease and diabetes.

## 2023-11-17 DIAGNOSIS — S161XXA Strain of muscle, fascia and tendon at neck level, initial encounter: Secondary | ICD-10-CM | POA: Diagnosis not present

## 2023-11-19 DIAGNOSIS — N1832 Chronic kidney disease, stage 3b: Secondary | ICD-10-CM | POA: Diagnosis not present

## 2023-11-26 DIAGNOSIS — D2272 Melanocytic nevi of left lower limb, including hip: Secondary | ICD-10-CM | POA: Diagnosis not present

## 2023-11-26 DIAGNOSIS — D225 Melanocytic nevi of trunk: Secondary | ICD-10-CM | POA: Diagnosis not present

## 2023-11-26 DIAGNOSIS — L821 Other seborrheic keratosis: Secondary | ICD-10-CM | POA: Diagnosis not present

## 2023-11-26 DIAGNOSIS — R809 Proteinuria, unspecified: Secondary | ICD-10-CM | POA: Diagnosis not present

## 2023-11-26 DIAGNOSIS — L570 Actinic keratosis: Secondary | ICD-10-CM | POA: Diagnosis not present

## 2023-11-26 DIAGNOSIS — D1801 Hemangioma of skin and subcutaneous tissue: Secondary | ICD-10-CM | POA: Diagnosis not present

## 2023-11-26 DIAGNOSIS — I129 Hypertensive chronic kidney disease with stage 1 through stage 4 chronic kidney disease, or unspecified chronic kidney disease: Secondary | ICD-10-CM | POA: Diagnosis not present

## 2023-11-26 DIAGNOSIS — N1832 Chronic kidney disease, stage 3b: Secondary | ICD-10-CM | POA: Diagnosis not present

## 2023-11-26 DIAGNOSIS — C642 Malignant neoplasm of left kidney, except renal pelvis: Secondary | ICD-10-CM | POA: Diagnosis not present

## 2023-11-26 DIAGNOSIS — D2271 Melanocytic nevi of right lower limb, including hip: Secondary | ICD-10-CM | POA: Diagnosis not present

## 2023-11-27 LAB — MICROALBUMIN / CREATININE URINE RATIO
Albumin, Urine POC: 31.9
Creatinine, POC: 33 mg/dL
Microalb Creat Ratio: 97

## 2023-11-28 ENCOUNTER — Other Ambulatory Visit: Payer: Self-pay | Admitting: Internal Medicine

## 2023-12-19 ENCOUNTER — Encounter: Payer: Self-pay | Admitting: Internal Medicine

## 2023-12-19 ENCOUNTER — Ambulatory Visit

## 2023-12-19 ENCOUNTER — Ambulatory Visit (INDEPENDENT_AMBULATORY_CARE_PROVIDER_SITE_OTHER): Admitting: Internal Medicine

## 2023-12-19 ENCOUNTER — Ambulatory Visit: Payer: Self-pay | Admitting: Internal Medicine

## 2023-12-19 VITALS — BP 174/94 | HR 69 | Temp 98.6°F | Ht 65.0 in | Wt 176.0 lb

## 2023-12-19 DIAGNOSIS — I10 Essential (primary) hypertension: Secondary | ICD-10-CM

## 2023-12-19 DIAGNOSIS — I2510 Atherosclerotic heart disease of native coronary artery without angina pectoris: Secondary | ICD-10-CM | POA: Diagnosis not present

## 2023-12-19 DIAGNOSIS — G8929 Other chronic pain: Secondary | ICD-10-CM | POA: Diagnosis not present

## 2023-12-19 DIAGNOSIS — N320 Bladder-neck obstruction: Secondary | ICD-10-CM

## 2023-12-19 DIAGNOSIS — M545 Low back pain, unspecified: Secondary | ICD-10-CM | POA: Diagnosis not present

## 2023-12-19 DIAGNOSIS — M47816 Spondylosis without myelopathy or radiculopathy, lumbar region: Secondary | ICD-10-CM | POA: Diagnosis not present

## 2023-12-19 DIAGNOSIS — Z0000 Encounter for general adult medical examination without abnormal findings: Secondary | ICD-10-CM

## 2023-12-19 DIAGNOSIS — Z125 Encounter for screening for malignant neoplasm of prostate: Secondary | ICD-10-CM | POA: Diagnosis not present

## 2023-12-19 DIAGNOSIS — I2583 Coronary atherosclerosis due to lipid rich plaque: Secondary | ICD-10-CM | POA: Diagnosis not present

## 2023-12-19 DIAGNOSIS — M51369 Other intervertebral disc degeneration, lumbar region without mention of lumbar back pain or lower extremity pain: Secondary | ICD-10-CM | POA: Diagnosis not present

## 2023-12-19 DIAGNOSIS — R10A0 Flank pain, unspecified side: Secondary | ICD-10-CM | POA: Diagnosis not present

## 2023-12-19 LAB — LIPID PANEL
Cholesterol: 199 mg/dL (ref 0–200)
HDL: 50.7 mg/dL (ref >39.00)
LDL Cholesterol: 128 mg/dL — ABNORMAL HIGH (ref 0–99)
NonHDL: 147.98
Total CHOL/HDL Ratio: 4
Triglycerides: 100 mg/dL (ref 0.0–149.0)
VLDL: 20 mg/dL (ref 0.0–40.0)

## 2023-12-19 LAB — URINALYSIS, ROUTINE W REFLEX MICROSCOPIC
Bilirubin Urine: NEGATIVE
Ketones, ur: NEGATIVE
Leukocytes,Ua: NEGATIVE
Nitrite: NEGATIVE
Specific Gravity, Urine: <=1.005 — AB (ref 1.000–1.030)
Total Protein, Urine: NEGATIVE
Urine Glucose: NEGATIVE
Urobilinogen, UA: 0.2 (ref 0.0–1.0)
pH: 5.5 (ref 5.0–8.0)

## 2023-12-19 LAB — COMPREHENSIVE METABOLIC PANEL WITH GFR
ALT: 8 U/L (ref 0–53)
AST: 13 U/L (ref 0–37)
Albumin: 4.7 g/dL (ref 3.5–5.2)
Alkaline Phosphatase: 62 U/L (ref 39–117)
BUN: 22 mg/dL (ref 6–23)
CO2: 30 meq/L (ref 19–32)
Calcium: 9.5 mg/dL (ref 8.4–10.5)
Chloride: 103 meq/L (ref 96–112)
Creatinine, Ser: 1.84 mg/dL — ABNORMAL HIGH (ref 0.40–1.50)
GFR: 34.25 mL/min — ABNORMAL LOW (ref >60.00)
Glucose, Bld: 95 mg/dL (ref 70–99)
Potassium: 4.8 meq/L (ref 3.5–5.1)
Sodium: 142 meq/L (ref 135–145)
Total Bilirubin: 1.3 mg/dL — ABNORMAL HIGH (ref 0.2–1.2)
Total Protein: 7.1 g/dL (ref 6.0–8.3)

## 2023-12-19 LAB — CBC WITH DIFFERENTIAL/PLATELET
Basophils Absolute: 0.1 K/uL (ref 0.0–0.1)
Basophils Relative: 0.8 % (ref 0.0–3.0)
Eosinophils Absolute: 0.1 K/uL (ref 0.0–0.7)
Eosinophils Relative: 1.1 % (ref 0.0–5.0)
HCT: 41.3 % (ref 39.0–52.0)
Hemoglobin: 13.1 g/dL (ref 13.0–17.0)
Lymphocytes Relative: 13.8 % (ref 12.0–46.0)
Lymphs Abs: 0.9 K/uL (ref 0.7–4.0)
MCHC: 31.8 g/dL (ref 30.0–36.0)
MCV: 58.7 fl — ABNORMAL LOW (ref 78.0–100.0)
Monocytes Absolute: 0.5 K/uL (ref 0.1–1.0)
Monocytes Relative: 7.9 % (ref 3.0–12.0)
Neutro Abs: 5 K/uL (ref 1.4–7.7)
Neutrophils Relative %: 76.4 % (ref 43.0–77.0)
Platelets: 140 K/uL — ABNORMAL LOW (ref 150.0–400.0)
RBC: 7.04 Mil/uL — ABNORMAL HIGH (ref 4.22–5.81)
RDW: 16.7 % — ABNORMAL HIGH (ref 11.5–15.5)
WBC: 6.5 K/uL (ref 4.0–10.5)

## 2023-12-19 LAB — PSA: PSA: 10.73 ng/mL — ABNORMAL HIGH (ref 0.10–4.00)

## 2023-12-19 LAB — TSH: TSH: 1.46 u[IU]/mL (ref 0.35–5.50)

## 2023-12-19 MED ORDER — TIZANIDINE HCL 4 MG PO TABS: 4.0000 mg | ORAL_TABLET | Freq: Three times a day (TID) | ORAL | 0 refills | Status: AC | PRN

## 2023-12-19 MED ORDER — HYDROCODONE-ACETAMINOPHEN 5-325 MG PO TABS: 1.0000 | ORAL_TABLET | Freq: Four times a day (QID) | ORAL | 0 refills | Status: AC | PRN

## 2023-12-19 MED ORDER — PANTOPRAZOLE SODIUM 40 MG PO TBEC: 40.0000 mg | DELAYED_RELEASE_TABLET | Freq: Every day | ORAL | 3 refills | Status: AC

## 2023-12-19 NOTE — Assessment & Plan Note (Signed)
 Worse SBP 150s Re-start Olmesartan  at 10 mg/d

## 2023-12-19 NOTE — Assessment & Plan Note (Addendum)
  We discussed age appropriate health related issues, including available/recomended screening tests and vaccinations. Labs were ordered to be later reviewed . All questions were answered. We discussed one or more of the following - seat belt use, use of sunscreen/sun exposure exercise, fall risk reduction, second hand smoke exposure, firearm use and storage, seat belt use, a need for adhering to healthy diet and exercise. Labs were ordered.  All questions were answered.  2021 Coronary calcium  score of 180 Agatston units.  Derm f/u q 12 months Colon 2016 PSA w/Dr Renda q 12 mo [facility] well w/Jill q 12 mo [facility] stress test 2017 No second Shingrix injection due to side effects

## 2023-12-19 NOTE — Progress Notes (Signed)
 Subjective:  Patient ID: TAMI BARREN, male    DOB: [birth year]  Age: 78 y.o. MRN: [id number]  CC: Annual Exam (Annual Exam. FYI of new left sciatic pain. Will ask about adding Align to medication regiment for GI issues (issues with complete bowel movements with miralax, gas after every meal). //Nasal drainage 4-6 months //Slight whistle when breathing through the mouth.)   HPI VERGIL BURBY presents for a well exam C/o L sciatica starting this AM C/o GERD Off Olmesartan  x 6 mo; no lightheadedness Well exam  Outpatient Medications Prior to Visit  Medication Sig Dispense Refill   aspirin  EC (ASPIRIN  LOW DOSE) 81 MG tablet TAKE 1 TABLET BY MOUTH DAILY SWALLOW WHOLE 90 tablet 3   Calcium -Vitamin D -Vitamin K (CALCIUM  + D + K PO) Take 1 tablet by mouth daily.     clopidogrel  (PLAVIX ) 75 MG tablet TAKE 1 TABLET BY MOUTH DAILY 90 tablet 3   Cyanocobalamin  (B-12) 2500 MCG TABS Take 2,500 mcg by mouth daily.     finasteride  (PROSCAR ) 5 MG tablet TAKE 1 TABLET BY MOUTH DAILY 90 tablet 3   fluticasone  (FLONASE ) 50 MCG/ACT nasal spray PLACE 2 SPRAYS INTO EACH NOSTRIL DAILY 16 mL 0   folic acid  (FOLVITE ) 1 MG tablet TAKE 1 TABLET BY MOUTH DAILY 90 tablet 1   lidocaine  (LIDODERM ) 5 % Place 1 patch onto the skin daily as needed (pain). Remove & Discard patch within 12 hours or as directed by MD     Melatonin 10 MG CHEW Chew 10 mg by mouth daily.     nitroGLYCERIN  (NITROSTAT ) 0.4 MG SL tablet DISSOLVE 1 TABLET UNDER TONGUE FOR CHEST PAIN - IF PAIN REMAINS AFTER 5 MIN, CALL 911 AND REPEAT DOSE. MAX 3 TABLETS IN 15 MINUTES 25 tablet 0   sildenafil (VIAGRA) 100 MG tablet Take 50 mg by mouth daily as needed for erectile dysfunction.     zolpidem  (AMBIEN ) 10 MG tablet TAKE 1 TABLET BY MOUTH AT BEDTIME 90 tablet 1   olmesartan  (BENICAR ) 20 MG tablet Take 1 tablet (20 mg total) by mouth daily. (Patient not taking: Reported on 12/19/2023) 90 tablet 3   co-enzyme Q-10 30 MG capsule Take 30 mg by mouth daily.      ipratropium (ATROVENT ) 0.03 % nasal spray Place 2 sprays into both nostrils 2 (two) times daily as needed for rhinitis. 30 mL 12   Probiotic Product (ALIGN) 4 MG CAPS Take 4 mg by mouth at bedtime.     No facility-administered medications prior to visit.    ROS: Review of Systems  Constitutional:  Negative for appetite change, fatigue and unexpected weight change.  HENT:  Negative for congestion, nosebleeds, sneezing, sore throat and trouble swallowing.   Eyes:  Negative for itching and visual disturbance.  Respiratory:  Negative for cough.   Cardiovascular:  Negative for chest pain, palpitations and leg swelling.  Gastrointestinal:  Negative for abdominal distention, blood in stool, diarrhea and nausea.  Genitourinary:  Negative for frequency and hematuria.  Musculoskeletal:  Positive for back pain and gait problem. Negative for joint swelling and neck pain.  Skin:  Negative for rash.  Neurological:  Negative for dizziness, tremors, speech difficulty and weakness.  Psychiatric/Behavioral:  Negative for agitation, dysphoric mood and sleep disturbance. The patient is not nervous/anxious.     Objective:  BP (!) 174/94   Pulse 69   Temp 98.6 F (37 C)   Ht 5' 5 (1.651 m)   Wt 176 lb (79.8 kg)  SpO2 99%   BMI 29.29 kg/m   BP Readings from Last 3 Encounters:  12/19/23 (!) 174/94  11/14/23 130/80  08/08/23 112/72    Wt Readings from Last 3 Encounters:  12/19/23 176 lb (79.8 kg)  11/14/23 177 lb (80.3 kg)  08/08/23 175 lb (79.4 kg)    Physical Exam Constitutional:      General: He is not in acute distress.    Appearance: He is well-developed.     Comments: NAD  Eyes:     Conjunctiva/sclera: Conjunctivae normal.     Pupils: Pupils are equal, round, and reactive to light.  Neck:     Thyroid : No thyromegaly.     Vascular: No JVD.  Cardiovascular:     Rate and Rhythm: Normal rate and regular rhythm.     Heart sounds: Normal heart sounds. No murmur heard.    No  friction rub. No gallop.  Pulmonary:     Effort: Pulmonary effort is normal. No respiratory distress.     Breath sounds: Normal breath sounds. No wheezing or rales.  Chest:     Chest wall: No tenderness.  Abdominal:     General: Bowel sounds are normal. There is no distension.     Palpations: Abdomen is soft. There is no mass.     Tenderness: There is no abdominal tenderness. There is no guarding or rebound.  Musculoskeletal:        General: No tenderness. Normal range of motion.     Cervical back: Normal range of motion.  Lymphadenopathy:     Cervical: No cervical adenopathy.  Skin:    General: Skin is warm and dry.     Findings: No rash.  Neurological:     Mental Status: He is alert and oriented to person, place, and time.     Cranial Nerves: No cranial nerve deficit.     Motor: No abnormal muscle tone.     Coordination: Coordination normal.     Gait: Gait normal.     Deep Tendon Reflexes: Reflexes are normal and symmetric.  Psychiatric:        Behavior: Behavior normal.        Thought Content: Thought content normal.        Judgment: Judgment normal.    In pain Rectal per urology LS - pain on the L  Lab Results  Component Value Date   WBC 6.5 12/19/2023   HGB 13.1 12/19/2023   HCT 41.3 12/19/2023   PLT 140.0 Repeated and verified X2. (L) 12/19/2023   GLUCOSE 95 12/19/2023   CHOL 199 12/19/2023   TRIG 100.0 12/19/2023   HDL 50.70 12/19/2023   LDLCALC 128 (H) 12/19/2023   ALT 8 12/19/2023   AST 13 12/19/2023   NA 142 12/19/2023   K 4.8 12/19/2023   CL 103 12/19/2023   CREATININE 1.84 (H) 12/19/2023   BUN 22 12/19/2023   CO2 30 12/19/2023   TSH 1.46 12/19/2023   PSA 10.73 (H) 12/19/2023   HGBA1C 6.0 05/09/2023    US  THYROID  Result Date: 08/31/2023 CLINICAL DATA:  Goiter. EXAM: THYROID  ULTRASOUND TECHNIQUE: Ultrasound examination of the thyroid  gland and adjacent soft tissues was performed. COMPARISON:  Remote prior thyroid  ultrasound 11/21/2019 FINDINGS:  Parenchymal Echotexture: Normal Isthmus: 0.5 cm Right lobe: 4.7 x 1.9 x 1.7 cm Left lobe: 5.6 x 1.7 x 2.5 cm _________________________________________________________ Estimated total number of nodules >/= 1 cm: 2 Number of spongiform nodules >/=  2 cm not described below (TR1): 0 Number of  mixed cystic and solid nodules >/= 1.5 cm not described below (TR2): 0 _________________________________________________________ Nodule # 1: Simple anechoic cyst in the left mid gland measures 3.6 x 2.2 x 1.2 cm. TI-RADS category 1. This nodule does NOT meet TI-RADS criteria for biopsy or dedicated follow-up. Nodule # 2: Subcentimeter isoechoic nodule in the left lower gland. No further follow-up. Nodule # 3: 1.2 x 0.9 x 0.9 cm predominantly solid isoechoic nodule in the left lower gland consistent with TI-RADS category 3. Given size (<1.4 cm) and appearance, this nodule does NOT meet TI-RADS criteria for biopsy or dedicated follow-up. IMPRESSION: 1. Left-sided simple thyroid  cyst and small incidental thyroid  nodules. None meet criteria to recommend biopsy or dedicated imaging surveillance. No further follow-up is recommended. The above is in keeping with the ACR TI-RADS recommendations - J Am Coll Radiol 2017;14:587-595. Electronically Signed   By: Wilkie Lent M.D.   On: 08/31/2023 13:38    Assessment & Plan:   Problem List Items Addressed This Visit     CAD (coronary artery disease)   On aspirin       Essential hypertension   Worse SBP 150s Re-start Olmesartan  at 10 mg/d      Relevant Orders   TSH (Completed)   Urinalysis   CBC with Differential/Platelet (Completed)   Lipid panel (Completed)   PSA (Completed)   Comprehensive metabolic panel with GFR (Completed)   Low back pain   Musculoskeletal pain.   Norco as needed  Potential benefits of a short term opioids use as well as potential risks (i.e. addiction risk, apnea etc) and complications (i.e. Somnolence, constipation and others) were explained  to the patient and were aknowledged. LS-spine x-ray Tizanidine as needed      Relevant Medications   tiZANidine (ZANAFLEX) 4 MG tablet   HYDROcodone-acetaminophen  (NORCO/VICODIN) 5-325 MG tablet   Other Relevant Orders   TSH (Completed)   Urinalysis   CBC with Differential/Platelet (Completed)   Lipid panel (Completed)   PSA (Completed)   Comprehensive metabolic panel with GFR (Completed)   DG Lumbar Spine 2-3 Views (Completed)   Well adult exam - Primary    We discussed age appropriate health related issues, including available/recomended screening tests and vaccinations. Labs were ordered to be later reviewed . All questions were answered. We discussed one or more of the following - seat belt use, use of sunscreen/sun exposure exercise, fall risk reduction, second hand smoke exposure, firearm use and storage, seat belt use, a need for adhering to healthy diet and exercise. Labs were ordered.  All questions were answered.  2021 Coronary calcium  score of 180 Agatston units.  Derm f/u q 12 months Colon 2016 PSA w/Dr Renda q 12 mo [facility] well w/Jill q 12 mo CL stress test 2017 No second Shingrix injection due to side effects       Relevant Orders   TSH (Completed)   Urinalysis   CBC with Differential/Platelet (Completed)   Lipid panel (Completed)   PSA (Completed)   Comprehensive metabolic panel with GFR (Completed)   Other Visit Diagnoses       Bladder neck obstruction       Relevant Orders   PSA (Completed)         Meds ordered this encounter  Medications   tiZANidine (ZANAFLEX) 4 MG tablet    Sig: Take 1 tablet (4 mg total) by mouth every 8 (eight) hours as needed for muscle spasms.    Dispense:  30 tablet    Refill:  0  HYDROcodone-acetaminophen  (NORCO/VICODIN) 5-325 MG tablet    Sig: Take 1 tablet by mouth every 6 (six) hours as needed.    Dispense:  20 tablet    Refill:  0   pantoprazole  (PROTONIX ) 40 MG tablet    Sig: Take 1 tablet (40 mg total) by mouth  daily.    Dispense:  90 tablet    Refill:  3      Follow-up: Return in about 4 weeks (around 01/16/2024) for a follow-up visit.  Marolyn Noel, MD

## 2023-12-22 ENCOUNTER — Other Ambulatory Visit: Payer: Self-pay | Admitting: Internal Medicine

## 2023-12-22 DIAGNOSIS — S161XXA Strain of muscle, fascia and tendon at neck level, initial encounter: Secondary | ICD-10-CM | POA: Diagnosis not present

## 2023-12-23 NOTE — Assessment & Plan Note (Signed)
 Musculoskeletal pain.   Norco as needed  Potential benefits of a short term opioids use as well as potential risks (i.e. addiction risk, apnea etc) and complications (i.e. Somnolence, constipation and others) were explained to the patient and were aknowledged. LS-spine x-ray Tizanidine as needed

## 2023-12-23 NOTE — Assessment & Plan Note (Signed)
 On aspirin

## 2023-12-26 ENCOUNTER — Telehealth: Payer: Self-pay | Admitting: Internal Medicine

## 2023-12-26 ENCOUNTER — Other Ambulatory Visit: Payer: Self-pay | Admitting: Internal Medicine

## 2023-12-26 DIAGNOSIS — D520 Dietary folate deficiency anemia: Secondary | ICD-10-CM

## 2023-12-26 NOTE — Telephone Encounter (Signed)
*  STAT* If patient is at the pharmacy, call can be transferred to refill team.   1. Which medications need to be refilled? (please list name of each medication and dose if known) clopidogrel  (PLAVIX ) 75 MG tablet    4. Which pharmacy/location (including street and city if local pharmacy) is medication to be sent to?  [facility] [id number] - [place], [place] - [address]     5. Do they need a 30 day or 90 day supply? 90  Sch 12/11

## 2023-12-26 NOTE — Telephone Encounter (Unsigned)
 Copied from CRM #[id number]. Topic: Clinical - Prescription Issue >> Dec 26, 2023  3:37 PM Avram MATSU wrote: Reason for CRM: patient stated his folic acid  (FOLVITE ) 1 MG tablet [[id number]] was not sent in with the rest of the medications.

## 2023-12-27 MED ORDER — CLOPIDOGREL BISULFATE 75 MG PO TABS: 75.0000 mg | ORAL_TABLET | Freq: Every day | ORAL | 3 refills | Status: AC

## 2023-12-27 MED ORDER — FOLIC ACID 1 MG PO TABS: 1.0000 mg | ORAL_TABLET | Freq: Every day | ORAL | 1 refills | Status: AC

## 2023-12-27 NOTE — Telephone Encounter (Signed)
 Refill sent.

## 2024-01-17 ENCOUNTER — Ambulatory Visit: Attending: Internal Medicine | Admitting: Internal Medicine

## 2024-01-17 ENCOUNTER — Encounter: Payer: Self-pay | Admitting: Internal Medicine

## 2024-01-17 VITALS — BP 177/71 | HR 79 | Ht 68.0 in | Wt 176.0 lb

## 2024-01-17 DIAGNOSIS — I2510 Atherosclerotic heart disease of native coronary artery without angina pectoris: Secondary | ICD-10-CM | POA: Diagnosis present

## 2024-01-17 DIAGNOSIS — I10 Essential (primary) hypertension: Secondary | ICD-10-CM | POA: Insufficient documentation

## 2024-01-17 DIAGNOSIS — R7303 Prediabetes: Secondary | ICD-10-CM | POA: Diagnosis present

## 2024-01-17 DIAGNOSIS — N401 Enlarged prostate with lower urinary tract symptoms: Secondary | ICD-10-CM | POA: Diagnosis not present

## 2024-01-17 DIAGNOSIS — I2583 Coronary atherosclerosis due to lipid rich plaque: Secondary | ICD-10-CM | POA: Diagnosis present

## 2024-01-17 MED ORDER — AMLODIPINE BESYLATE 2.5 MG PO TABS: 2.5000 mg | ORAL_TABLET | Freq: Every day | ORAL | 3 refills | Status: AC

## 2024-01-17 NOTE — Progress Notes (Unsigned)
 Cardiology Office Note   Date:  01/18/2024  ID:  Chad Palmer, Chad Palmer Mar 10, 1945, MRN [id number]  PCP:  Garald Karlynn GAILS, MD  Cardiologist:   Vina Gull, MD   Pt presents for follow up of CAD     History of Present Illness: Chad Palmer is a 78 y.o. male with a history of CAD, HTN, GERD hx of esophageal stricture (s/p dilation)  .  He is s/p nephrectomy    April 2024 the pt developed L arm pain with exertion. Also had DOE.  Set up for  PET/CT which was highly positive for ischemia    June 2024  LHC done had successful intervention for 100% LAD    Aug 2024 Pt had an episode of syncope   EMS found him to be hypotensive  Given fluids  hydrochlorothiazide  taken off med list  I saw the pt in Oct 2024   Since then he denies CP   Breathing is ok  No dizziness Bother by back problems  (spinal stenosis)       Current Meds  Medication Sig   aspirin  EC (ASPIRIN  LOW DOSE) 81 MG tablet TAKE 1 TABLET BY MOUTH DAILY SWALLOW WHOLE   Calcium -Vitamin D -Vitamin K (CALCIUM  + D + K PO) Take 1 tablet by mouth daily.   clopidogrel  (PLAVIX ) 75 MG tablet Take 1 tablet (75 mg total) by mouth daily.   Cyanocobalamin  (B-12) 2500 MCG TABS Take 2,500 mcg by mouth daily.   finasteride  (PROSCAR ) 5 MG tablet TAKE 1 TABLET BY MOUTH DAILY   fluticasone  (FLONASE ) 50 MCG/ACT nasal spray PLACE 2 SPRAYS INTO EACH NOSTRIL DAILY   folic acid  (FOLVITE ) 1 MG tablet Take 1 tablet (1 mg total) by mouth daily.   lidocaine  (LIDODERM ) 5 % Place 1 patch onto the skin daily as needed (pain). Remove & Discard patch within 12 hours or as directed by MD   MAGNESIUM  GLUCONATE PO Take by mouth.   Melatonin 10 MG CHEW Chew 10 mg by mouth daily.   nitroGLYCERIN  (NITROSTAT ) 0.4 MG SL tablet DISSOLVE 1 TABLET UNDER TONGUE FOR CHEST PAIN - IF PAIN REMAINS AFTER 5 MIN, CALL 911 AND REPEAT DOSE. MAX 3 TABLETS IN 15 MINUTES   olmesartan  (BENICAR ) 20 MG tablet Take 1 tablet (20 mg total) by mouth daily.   pantoprazole  (PROTONIX ) 40  MG tablet Take 1 tablet (40 mg total) by mouth daily.   sildenafil (VIAGRA) 100 MG tablet Take 50 mg by mouth daily as needed for erectile dysfunction.   zolpidem  (AMBIEN ) 10 MG tablet TAKE 1 TABLET BY MOUTH AT BEDTIME     Allergies:   Iodinated contrast media, Iohexol , Crestor  [rosuvastatin ], Levofloxacin, Lipitor [atorvastatin ], Omeprazole , Rapaflo [silodosin], Bactrim  [sulfamethoxazole -trimethoprim ], and Shingrix [zoster vac recomb adjuvanted]   Past Medical History:  Diagnosis Date   Allergic rhinitis    Arthritis    BPH (benign prostatic hyperplasia)    Cancer (HCC) 2021   kidney    Cataract 2013   bilateral    Cervical spinal stenosis    Dr Dohmier   Colon polyp    Diverticulosis of colon    ED (erectile dysfunction)    Elevated PSA    GERD (gastroesophageal reflux disease) 2020   Hemorrhoids    HTN (hypertension)    Microcytosis     Past Surgical History:  Procedure Laterality Date   cataract surg     BIL   CORONARY CTO INTERVENTION N/A 08/01/2022   Procedure: CORONARY CTO INTERVENTION;  Surgeon: Wendel,  Lurena POUR, MD;  Location: [facility];  Service: Cardiovascular;  Laterality: N/A;   CORONARY ULTRASOUND/IVUS N/A 08/01/2022   Procedure: Coronary Ultrasound/IVUS;  Surgeon: Wendel Lurena POUR, MD;  Location: [facility];  Service: Cardiovascular;  Laterality: N/A;   EYE SURGERY  2013   Cataract   HEMORRHOID SURGERY     LAPAROSCOPIC NEPHRECTOMY Left 12/22/2019   Procedure: LAPAROSCOPIC RADICAL NEPHRECTOMY;  Surgeon: Renda Glance, MD;  Location: [facility];  Service: Urology;  Laterality: Left;   LEFT HEART CATH AND CORONARY ANGIOGRAPHY N/A 08/01/2022   Procedure: LEFT HEART CATH AND CORONARY ANGIOGRAPHY;  Surgeon: Wendel Lurena POUR, MD;  Location: [facility];  Service: Cardiovascular;  Laterality: N/A;   TOENAIL EXCISION  02/07/2007   ALL   TONSILLECTOMY     VASECTOMY     WISDOM TOOTH EXTRACTION       Social History:  The patient  reports  that he has never smoked. He has never used smokeless tobacco. He reports that he does not drink alcohol and does not use drugs.   Family History:  The patient's family history includes Alzheimer's disease in his brother and mother; Cancer in his sister; Diabetes in his maternal grandmother and mother; Heart disease in his father, mother, sister, and sister; Hypertension in an other family member.    ROS:  Please see the history of present illness. All other systems are reviewed and  Negative to the above problem except as noted.    PHYSICAL EXAM: VS:  BP (!) 177/71 (BP Location: Left Arm, Patient Position: Sitting, Cuff Size: Normal)   Pulse 79   Ht 5' 8 (1.727 m)   Wt 176 lb (79.8 kg)   SpO2 98%   BMI 26.76 kg/m    GEN: Well nourished, well developed, in no acute distress  Neck: no JVD, no carotid bruits Cardiac: RRR; no murmur Respiratory:  clear to auscultation bilaterally GI: soft, nontender, No hepatomegaly  Ext  No LE edema  2+ PT pulses      EKG  Not done    [facility]  August 01 2022   1st Diag lesion is 30% stenosed.   Mid LAD lesion is 100% stenosed.   A stent was successfully placed.   Post intervention, there is a 0% residual stenosis.   1.  Mid LAD chronic total occlusion treated with 2 overlapping drug-eluting stents with cutting balloon angioplasty and IVUS guidance. 2.  LVEDP of 26 mmHg.   Recommendation: 6 hours of IV fluids, same-day discharge with dual antiplatelet therapy consisting of aspirin  and Plavix  for 6 months then Plavix  monotherapy indefinitely.  PET/CT   JUne 2024     Findings are consistent with ischemia. The study is high risk due to LAD territory ischemia, the presence of TID, and the drop in EF with stress. Recommend coronary catheterization for further evaluation.   LV perfusion is abnormal. There is evidence of ischemia. Defect 1: There is a large defect with severe reduction in uptake present in the apical to mid anterior, anteroseptal,  inferoseptal, septal and apex location(s) that is reversible. There is abnormal wall motion in the defect area. Consistent with ischemia. The defect is consistent with abnormal perfusion in the LAD territory. Defect 2: There is a small defect present in the apical inferior location(s). There is abnormal wall motion in the defect area. Consistent with ischemia.   Rest left ventricular function is normal. Rest EF: 52 %. Stress left ventricular function is normal. Stress EF:  50 %. The EF dropped slightly with stress in the presence of significant ischemia. Recommend cath as detailed above.  End diastolic cavity size is normal. End systolic cavity size is normal.   Myocardial blood flow was computed to be 0.38ml/g/min at rest and 1.16ml/g/min at stress. Global myocardial blood flow reserve was 1.68 and was abnormal. Notably, the MBF was most reduced in the LAD territory which is consistent with the large perfusion defect as detailed above.   Coronary calcium  was present on the attenuation correction CT images. Moderate coronary calcifications were present. Coronary calcifications were present in the left anterior descending artery and left circumflex artery distribution(s).   Electronically Signed: Powell Sorrow, MD  Calcium  score CT  2021  Coronary calcium  score of 180 Agatston units. This was 44th percentile for age and sex matched control, suggesting low to intermediate risk for future cardiac events.  Lipid Panel    Component Value Date/Time   CHOL 199 12/19/2023 1043   TRIG 100.0 12/19/2023 1043   HDL 50.70 12/19/2023 1043   CHOLHDL 4 12/19/2023 1043   VLDL 20.0 12/19/2023 1043   LDLCALC 128 (H) 12/19/2023 1043      Wt Readings from Last 3 Encounters:  01/17/24 176 lb (79.8 kg)  12/19/23 176 lb (79.8 kg)  11/14/23 177 lb (80.3 kg)      ASSESSMENT AND PLAN:  1  CAD  S/p PTCA/DES to LAD in 2024  Doing well  No CP     2  HTN  BP is up/down  HIgh today   WIll start with low dose  amlodipine    Follow up with BP readings   Has appt with IM and with Renal (get recent visit)  3  HL  Last LDL 128  HDL 51  Trig 100   Did not toleate lipitor   Would recomm Repatha     4 Renal  S/p nephrectomy for renal CA   Last Cr 11/25 was 1.84    Plan for follow up in March 2026   Current medicines are reviewed at length with the patient today.  The patient does not have concerns regarding medicines.  Signed, Vina Gull, MD

## 2024-01-17 NOTE — Patient Instructions (Signed)
 Medication Instructions:  START Amlodipine  (Norvasc ) 2.5 mg, take one (1) tablet by mouth once daily.   Our Pharmacist will reach out to you regarding Repatha *If you need a refill on your cardiac medications before your next appointment, please call your pharmacy*  Lab Work: None ordered If you have labs (blood work) drawn today and your tests are completely normal, you will receive your results only by: MyChart Message (if you have MyChart) OR A paper copy in the mail If you have any lab test that is abnormal or we need to change your treatment, we will call you to review the results.  Testing/Procedures: None ordered  Follow-Up: At [facility], you and your health needs are our priority.  As part of our continuing mission to provide you with exceptional heart care, our providers are all part of one team.  This team includes your primary Cardiologist (physician) and Advanced Practice Providers or APPs (Physician Assistants and Nurse Practitioners) who all work together to provide you with the care you need, when you need it.  Your next appointment:   3 month(s)  Provider:   Vina Gull, MD     We recommend signing up for the patient portal called MyChart.  Sign up information is provided on this After Visit Summary.  MyChart is used to connect with patients for Virtual Visits (Telemedicine).  Patients are able to view lab/test results, encounter notes, upcoming appointments, etc.  Non-urgent messages can be sent to your provider as well.   To learn more about what you can do with MyChart, go to [email or web address].

## 2024-01-28 ENCOUNTER — Encounter: Payer: Self-pay | Admitting: Internal Medicine

## 2024-01-28 ENCOUNTER — Telehealth: Payer: Self-pay

## 2024-01-28 NOTE — Telephone Encounter (Signed)
 Call to patient to offer DOD appt.   Patient recently seen by Dr. Okey 01/17/24. He was started on blood pressure medication after a BP reading of 177/71. Patient reports that since then, my blood pressure is all over the place. He states that today his BP is 150/85 and he has been experiencing blurry vision and lightheadedness.   He reports he normally takes his olmesartan , finasteride  and amlodipine  together in the morning around 7-8 AM. He notes he has been having lightheadedness off and on for the past 3 days. On review of chart, patient acknowledges he has a long history of lightheadedness caused by vertigo. He endorses the lightheadedness he felt this morning from 10 AM to 2 PM feels very similar to the lightheadedness he experiences with vertigo in terms of how he feels his balance is off.  He denies any room spinning or syncope/near-syncope. He also acknowledges he has not been drinking enough water.   He also reports that he had an eye exam several months ago and everything was normal. However, lately on overcast days he struggles with blurry vision. Today he reports he has had blurry vision all afternoon and it has been cloudy where he lives. He denies any weakness, headache at this time.  He reports he feels better now, accepts DOD appt for tomorrow 01/29/24. Also reviewed ED precautions, patient verbalizes understanding to go to ED if BP continues to escalate and is accompanied by blurry vision, headache, weakness or syncope.

## 2024-01-29 ENCOUNTER — Ambulatory Visit: Attending: Internal Medicine | Admitting: Internal Medicine

## 2024-01-29 VITALS — BP 152/82 | HR 81 | Ht 68.0 in | Wt 179.0 lb

## 2024-01-29 DIAGNOSIS — I10 Essential (primary) hypertension: Secondary | ICD-10-CM | POA: Diagnosis present

## 2024-01-29 DIAGNOSIS — I2583 Coronary atherosclerosis due to lipid rich plaque: Secondary | ICD-10-CM | POA: Diagnosis present

## 2024-01-29 DIAGNOSIS — Z955 Presence of coronary angioplasty implant and graft: Secondary | ICD-10-CM | POA: Insufficient documentation

## 2024-01-29 DIAGNOSIS — I2510 Atherosclerotic heart disease of native coronary artery without angina pectoris: Secondary | ICD-10-CM | POA: Diagnosis present

## 2024-01-29 NOTE — Progress Notes (Signed)
 " Cardiology Office Note:  .   Date:  01/29/2024  ID:  Chad Palmer, DOB 10-23-1945, MRN [id number] PCP: Garald Karlynn GAILS, MD  [facility] Providers Cardiologist:  Vina Gull, MD    History of Present Illness: .   Chad Palmer is a 78 y.o. male.  Discussed the use of AI scribe software for clinical note transcription with the patient, who gave verbal consent to proceed. Past Medical History:  Diagnosis Date   Allergic rhinitis    Arthritis    BPH (benign prostatic hyperplasia)    Cancer (HCC) 2021   kidney    Cataract 2013   bilateral    Cervical spinal stenosis    Dr Dohmier   Colon polyp    Diverticulosis of colon    ED (erectile dysfunction)    Elevated PSA    GERD (gastroesophageal reflux disease) 2020   Hemorrhoids    HTN (hypertension)    Microcytosis      History of Present Illness Chad Palmer is an 78 year old male with hypertension and coronary artery disease who presents with blood pressure management concerns. He was recently seen by his primary cardiologist, Dr. Gull, two weeks ago.  He has coronary artery disease and hypertension. He underwent cardiac catheterization in June 2024 for a 100% occluded left anterior descending artery. He was taken off hydrochlorothiazide  about eight months ago after syncope and hypotension, which resolved his imbalance and uneasiness at that time.  Two weeks ago, he started amlodipine  2.5 mg daily for elevated blood pressure. Since then, he feels uneasy and describes his blood pressure as unstable, with home readings now 140 to 150 systolic and diastolic in the 19d. Before amlodipine , his systolic pressures were usually 150 to 170 with diastolic mostly in the 80s to low 90s.  He senses an uneasy feeling when his blood pressure is high, often in the afternoon, and associates this with elevated blood pressure or dehydration if he forgets to drink water. After stopping hydrochlorothiazide  his uneasiness and balance  issues improved, but his blood pressure rose again before his recent visit with Dr. Gull.  He is currently taking olmesartan  20 mg, finasteride , clopidogrel  75 mg, baby aspirin , and pantoprazole , in addition to amlodipine .    ROS: negative except per HPI above.  Studies Reviewed: SABRA   EKG Interpretation Date/Time:  Tuesday January 29 2024 13:20:56 EST Ventricular Rate:  81 PR Interval:  144 QRS Duration:  96 QT Interval:  378 QTC Calculation: 439 R Axis:   65  Text Interpretation: Sinus rhythm with occasional Premature ventricular complexes When compared with ECG of 21-Nov-2022 14:30, Premature ventricular complexes are now Present Premature atrial complexes are no longer Present Confirmed by Orissa Arreaga ([id number]) on 01/29/2024 1:26:01 PM    Results Radiology Nuclear medicine PET CT: Abnormal  Diagnostic Cardiac catheterization (07/2022): Successful intervention of 100% occluded left anterior descending artery Risk Assessment/Calculations:       Physical Exam:   VS:  BP (!) 152/82 (BP Location: Left Arm, Patient Position: Sitting, Cuff Size: Normal)   Pulse 81   Ht 5' 8 (1.727 m)   Wt 179 lb (81.2 kg)   SpO2 97%   BMI 27.22 kg/m    Wt Readings from Last 3 Encounters:  01/29/24 179 lb (81.2 kg)  01/17/24 176 lb (79.8 kg)  12/19/23 176 lb (79.8 kg)     Physical Exam GENERAL: Alert, cooperative, well developed, no acute distress. HEENT: Normocephalic, normal oropharynx, moist mucous membranes.  CHEST: Clear to auscultation bilaterally, no wheezes, rhonchi, or crackles. CARDIOVASCULAR: Normal heart rate and rhythm, S1 and S2 normal without murmurs. ABDOMEN: Soft, non-tender, non-distended, without organomegaly, normal bowel sounds. EXTREMITIES: No cyanosis or edema. NEUROLOGICAL: Cranial nerves grossly intact, moves all extremities without gross motor or sensory deficit.   ASSESSMENT AND PLAN: .    Assessment and Plan Assessment & Plan Essential  hypertension Hypertension managed with amlodipine  2.5 mg daily. Blood pressure improved but reports lightheadedness, possibly due to dehydration or fluctuations. Current blood pressure acceptable for age with addition of amlodipine , but may need to be lenient given prior hypotension and syncope. - Continue amlodipine  2.5 mg daily with no changes.  - Monitor blood pressure regularly. - Ensure adequate hydration, especially in the morning. - Consider splitting doses of amlodipine  and olmesartan  to manage symptoms.  Coronary artery disease Post-intervention for LAD occlusion 08/01/22. On Plavix  and aspirin . No recent chest pain. Discussed aspirin  and Plavix  maintained for antiplatelet therapy. Pantoprazole  for gastric protection. - Continue Plavix  75 mg daily and asa 81 mg daily. He has completed >1 year of DAPT, and may be a good candidate for plavix  monotherapy. Will discuss with primary cardiologist.   Recording duration: 22 minutes  I spent 25 minutes in the care of Chad Palmer today including reviewing labs (12/19/23), reviewing studies (cath, pet), face to face time discussing treatment options (22 min), and documenting in the encounter.     Soyla Merck, MD, [facility] "

## 2024-01-29 NOTE — Patient Instructions (Signed)
 Medication Instructions:  No Changes   Lab Work: None  Follow-Up: At [facility], you and your health needs are our priority.  As part of our continuing mission to provide you with exceptional heart care, our providers are all part of one team.  This team includes your primary Cardiologist (physician) and Advanced Practice Providers or APPs (Physician Assistants and Nurse Practitioners) who all work together to provide you with the care you need, when you need it.  Your next appointment:    05/02/2024 at 9:15 am  Provider:   Glendia Ferrier, PA  Then Glendia will let you know when you need to return to see either Dr. Loni or Physician's Assistant  Other Instructions You can check your blood pressure at home. If you continue to not feel well or if your BP is consistently higher than 140/90, get in touch with us .   Please call us  or send a MyChart message with any Cardiology related questions/concerns.  [phone].  Thank you!

## 2024-02-13 NOTE — Progress Notes (Signed)
 Want to make sure he metabolizes well before changing meds

## 2024-02-13 NOTE — Progress Notes (Signed)
 Chad Palmer- Please set patient up for genetic test for Plavix  metabolism   Not sure how to order He has overlapping stents in LAD

## 2024-05-02 ENCOUNTER — Ambulatory Visit: Admitting: Physician Assistant

## 2024-12-25 ENCOUNTER — Ambulatory Visit

## 2024-12-25 ENCOUNTER — Encounter: Admitting: Internal Medicine
